# Patient Record
Sex: Male | Born: 1940
Health system: Southern US, Community
[De-identification: ages and names within clinical notes are randomized; demographics above are authoritative.]

## PROBLEM LIST (undated history)

## (undated) DIAGNOSIS — M109 Gout, unspecified: Secondary | ICD-10-CM

## (undated) DIAGNOSIS — Z0000 Encounter for general adult medical examination without abnormal findings: Secondary | ICD-10-CM

## (undated) DIAGNOSIS — I4891 Unspecified atrial fibrillation: Secondary | ICD-10-CM

## (undated) DIAGNOSIS — F489 Nonpsychotic mental disorder, unspecified: Secondary | ICD-10-CM

## (undated) DIAGNOSIS — M1990 Unspecified osteoarthritis, unspecified site: Secondary | ICD-10-CM

## (undated) DIAGNOSIS — K635 Polyp of colon: Secondary | ICD-10-CM

## (undated) DIAGNOSIS — J189 Pneumonia, unspecified organism: Secondary | ICD-10-CM

## (undated) DIAGNOSIS — F419 Anxiety disorder, unspecified: Secondary | ICD-10-CM

## (undated) DIAGNOSIS — E7800 Pure hypercholesterolemia, unspecified: Secondary | ICD-10-CM

## (undated) DIAGNOSIS — F329 Major depressive disorder, single episode, unspecified: Secondary | ICD-10-CM

## (undated) DIAGNOSIS — I10 Essential (primary) hypertension: Secondary | ICD-10-CM

## (undated) DIAGNOSIS — F32A Depression, unspecified: Secondary | ICD-10-CM

## (undated) DIAGNOSIS — G4730 Sleep apnea, unspecified: Secondary | ICD-10-CM

## (undated) DIAGNOSIS — K219 Gastro-esophageal reflux disease without esophagitis: Secondary | ICD-10-CM

## (undated) HISTORY — DX: Unspecified osteoarthritis, unspecified site: M19.90

## (undated) HISTORY — DX: Major depressive disorder, single episode, unspecified: F32.9

## (undated) HISTORY — DX: Pneumonia, unspecified organism: J18.9

## (undated) HISTORY — PX: OTHER SURGICAL HISTORY: SHX169

## (undated) HISTORY — DX: Polyp of colon: K63.5

## (undated) HISTORY — DX: Gout, unspecified: M10.9

## (undated) HISTORY — DX: Encounter for general adult medical examination without abnormal findings: Z00.00

## (undated) HISTORY — DX: Essential (primary) hypertension: I10

## (undated) HISTORY — DX: Gastro-esophageal reflux disease without esophagitis: K21.9

## (undated) HISTORY — DX: Depression, unspecified: F32.A

## (undated) HISTORY — DX: Pure hypercholesterolemia, unspecified: E78.00

## (undated) HISTORY — DX: Nonpsychotic mental disorder, unspecified: F48.9

## (undated) HISTORY — DX: Sleep apnea, unspecified: G47.30

## (undated) HISTORY — PX: WISDOM TOOTH EXTRACTION: SHX21

## (undated) HISTORY — DX: Anxiety disorder, unspecified: F41.9

## (undated) HISTORY — DX: Unspecified atrial fibrillation: I48.91

---

## 2000-05-11 ENCOUNTER — Emergency Department (HOSPITAL_COMMUNITY): Admission: EM | Admit: 2000-05-11 | Discharge: 2000-05-11 | Payer: Self-pay | Admitting: Emergency Medicine

## 2000-05-11 ENCOUNTER — Encounter: Payer: Self-pay | Admitting: Emergency Medicine

## 2006-06-30 ENCOUNTER — Ambulatory Visit: Payer: Self-pay | Admitting: Pulmonary Disease

## 2006-09-08 ENCOUNTER — Ambulatory Visit: Payer: Self-pay | Admitting: Pulmonary Disease

## 2006-09-20 ENCOUNTER — Ambulatory Visit: Payer: Self-pay | Admitting: Gastroenterology

## 2006-10-04 ENCOUNTER — Ambulatory Visit: Payer: Self-pay | Admitting: Gastroenterology

## 2006-10-04 HISTORY — PX: COLONOSCOPY: SHX174

## 2007-07-03 ENCOUNTER — Emergency Department (HOSPITAL_COMMUNITY): Admission: EM | Admit: 2007-07-03 | Discharge: 2007-07-03 | Payer: Self-pay | Admitting: Emergency Medicine

## 2007-07-17 ENCOUNTER — Ambulatory Visit: Payer: Self-pay | Admitting: Pulmonary Disease

## 2007-12-28 ENCOUNTER — Emergency Department (HOSPITAL_COMMUNITY): Admission: EM | Admit: 2007-12-28 | Discharge: 2007-12-28 | Payer: Self-pay | Admitting: Emergency Medicine

## 2008-05-01 DIAGNOSIS — E7800 Pure hypercholesterolemia, unspecified: Secondary | ICD-10-CM

## 2008-07-16 ENCOUNTER — Ambulatory Visit: Payer: Self-pay | Admitting: Pulmonary Disease

## 2008-07-16 DIAGNOSIS — F489 Nonpsychotic mental disorder, unspecified: Secondary | ICD-10-CM | POA: Insufficient documentation

## 2008-07-16 DIAGNOSIS — Z8701 Personal history of pneumonia (recurrent): Secondary | ICD-10-CM

## 2008-07-16 DIAGNOSIS — Z8679 Personal history of other diseases of the circulatory system: Secondary | ICD-10-CM | POA: Insufficient documentation

## 2008-08-26 ENCOUNTER — Encounter: Payer: Self-pay | Admitting: Pulmonary Disease

## 2008-10-15 ENCOUNTER — Ambulatory Visit: Payer: Self-pay | Admitting: Pulmonary Disease

## 2008-12-16 ENCOUNTER — Ambulatory Visit: Payer: Self-pay | Admitting: Pulmonary Disease

## 2009-11-20 ENCOUNTER — Ambulatory Visit: Payer: Self-pay | Admitting: Pulmonary Disease

## 2009-11-20 DIAGNOSIS — M1990 Unspecified osteoarthritis, unspecified site: Secondary | ICD-10-CM

## 2009-11-21 LAB — CONVERTED CEMR LAB
ALT: 24 units/L (ref 0–53)
AST: 28 units/L (ref 0–37)
Albumin: 4.3 g/dL (ref 3.5–5.2)
Alkaline Phosphatase: 53 units/L (ref 39–117)
BUN: 9 mg/dL (ref 6–23)
Basophils Absolute: 0 10*3/uL (ref 0.0–0.1)
Basophils Relative: 0.5 % (ref 0.0–3.0)
Bilirubin Urine: NEGATIVE
Bilirubin, Direct: 0.1 mg/dL (ref 0.0–0.3)
CO2: 30 meq/L (ref 19–32)
Calcium: 9.5 mg/dL (ref 8.4–10.5)
Chloride: 106 meq/L (ref 96–112)
Cholesterol: 152 mg/dL (ref 0–200)
Creatinine, Ser: 0.8 mg/dL (ref 0.4–1.5)
Eosinophils Absolute: 0.2 10*3/uL (ref 0.0–0.7)
Eosinophils Relative: 3.9 % (ref 0.0–5.0)
GFR calc non Af Amer: 102.71 mL/min (ref >60)
Glucose, Bld: 97 mg/dL (ref 70–99)
HCT: 42.7 % (ref 39.0–52.0)
HDL: 55 mg/dL (ref >39.00)
Hemoglobin: 14.5 g/dL (ref 13.0–17.0)
Ketones, ur: NEGATIVE mg/dL
LDL Cholesterol: 84 mg/dL (ref 0–99)
Leukocytes, UA: NEGATIVE
Lymphocytes Relative: 32.9 % (ref 12.0–46.0)
Lymphs Abs: 2.1 10*3/uL (ref 0.7–4.0)
MCHC: 34 g/dL (ref 30.0–36.0)
MCV: 93.2 fL (ref 78.0–100.0)
Monocytes Absolute: 0.5 10*3/uL (ref 0.1–1.0)
Monocytes Relative: 7.1 % (ref 3.0–12.0)
Neutro Abs: 3.6 10*3/uL (ref 1.4–7.7)
Neutrophils Relative %: 55.6 % (ref 43.0–77.0)
Nitrite: NEGATIVE
PSA: 0.54 ng/mL (ref 0.10–4.00)
Platelets: 215 10*3/uL (ref 150.0–400.0)
Potassium: 4.7 meq/L (ref 3.5–5.1)
RBC: 4.57 M/uL (ref 4.22–5.81)
RDW: 12.6 % (ref 11.5–14.6)
Sodium: 142 meq/L (ref 135–145)
Specific Gravity, Urine: >=1.03 (ref 1.000–1.030)
TSH: 1.47 microintl units/mL (ref 0.35–5.50)
Total Bilirubin: 1 mg/dL (ref 0.3–1.2)
Total CHOL/HDL Ratio: 3
Total Protein, Urine: NEGATIVE mg/dL
Total Protein: 7.4 g/dL (ref 6.0–8.3)
Triglycerides: 63 mg/dL (ref 0.0–149.0)
Uric Acid, Serum: 7.5 mg/dL (ref 4.0–7.8)
Urine Glucose: NEGATIVE mg/dL
Urobilinogen, UA: 0.2 (ref 0.0–1.0)
VLDL: 12.6 mg/dL (ref 0.0–40.0)
WBC: 6.4 10*3/uL (ref 4.5–10.5)
pH: 5 (ref 5.0–8.0)

## 2010-11-15 ENCOUNTER — Telehealth (INDEPENDENT_AMBULATORY_CARE_PROVIDER_SITE_OTHER): Payer: Self-pay | Admitting: *Deleted

## 2010-12-10 ENCOUNTER — Ambulatory Visit: Payer: Self-pay | Admitting: Pulmonary Disease

## 2011-01-19 ENCOUNTER — Ambulatory Visit: Admit: 2011-01-19 | Payer: Self-pay | Admitting: Pulmonary Disease

## 2011-01-23 LAB — CONVERTED CEMR LAB
ALT: 22 units/L (ref 0–53)
AST: 26 units/L (ref 0–37)
Albumin: 4.1 g/dL (ref 3.5–5.2)
Alkaline Phosphatase: 69 units/L (ref 39–117)
BUN: 14 mg/dL (ref 6–23)
Basophils Absolute: 0 10*3/uL (ref 0.0–0.1)
Basophils Relative: 0.5 % (ref 0.0–3.0)
Bilirubin Urine: NEGATIVE
Bilirubin, Direct: 0.1 mg/dL (ref 0.0–0.3)
CO2: 29 meq/L (ref 19–32)
Calcium: 9.6 mg/dL (ref 8.4–10.5)
Chloride: 105 meq/L (ref 96–112)
Cholesterol: 168 mg/dL (ref 0–200)
Creatinine, Ser: 0.8 mg/dL (ref 0.4–1.5)
Crystals: NEGATIVE
Eosinophils Absolute: 0.3 10*3/uL (ref 0.0–0.7)
Eosinophils Relative: 4.8 % (ref 0.0–5.0)
GFR calc Af Amer: 125 mL/min
GFR calc non Af Amer: 103 mL/min
Glucose, Bld: 99 mg/dL (ref 70–99)
HCT: 42.2 % (ref 39.0–52.0)
HDL: 47.5 mg/dL (ref >39.0)
Hemoglobin, Urine: NEGATIVE
Hemoglobin: 14.6 g/dL (ref 13.0–17.0)
Ketones, ur: NEGATIVE mg/dL
LDL Cholesterol: 105 mg/dL — ABNORMAL HIGH (ref 0–99)
Leukocytes, UA: NEGATIVE
Lymphocytes Relative: 33 % (ref 12.0–46.0)
MCHC: 34.7 g/dL (ref 30.0–36.0)
MCV: 91.2 fL (ref 78.0–100.0)
Monocytes Absolute: 0.6 10*3/uL (ref 0.1–1.0)
Monocytes Relative: 8.2 % (ref 3.0–12.0)
Neutro Abs: 3.9 10*3/uL (ref 1.4–7.7)
Neutrophils Relative %: 53.5 % (ref 43.0–77.0)
Nitrite: NEGATIVE
PSA: 0.52 ng/mL (ref 0.10–4.00)
Platelets: 242 10*3/uL (ref 150–400)
Potassium: 4.4 meq/L (ref 3.5–5.1)
RBC: 4.62 M/uL (ref 4.22–5.81)
RDW: 12.6 % (ref 11.5–14.6)
Sodium: 141 meq/L (ref 135–145)
Specific Gravity, Urine: >=1.03 (ref 1.000–1.03)
TSH: 1.64 microintl units/mL (ref 0.35–5.50)
Total Bilirubin: 1.1 mg/dL (ref 0.3–1.2)
Total CHOL/HDL Ratio: 3.5
Total Protein, Urine: NEGATIVE mg/dL
Total Protein: 7.3 g/dL (ref 6.0–8.3)
Triglycerides: 79 mg/dL (ref 0–149)
Urine Glucose: NEGATIVE mg/dL
Urobilinogen, UA: 0.2 (ref 0.0–1.0)
VLDL: 16 mg/dL (ref 0–40)
WBC: 7.2 10*3/uL (ref 4.5–10.5)
pH: 5.5 (ref 5.0–8.0)

## 2011-01-25 NOTE — Progress Notes (Signed)
Summary: rx request  Phone Note Call from Patient   Caller: Spouse-jane Zaldivar Call For: nadel Summary of Call: requests 1 month rx simvastatin- [facility] [address] (pt will get the remainder thru mailorder but needs this in the meantime). call jane Spagna at [phone] Initial call taken by: Tivis Ringer, CNA,  November 15, 2010 12:17 PM  Follow-up for Phone Call        Pt's spouse informed that [facility] [address]. Abigail Miyamoto RN  November 15, 2010 12:21 PM     Prescriptions: ZOCOR 40 MG  TABS (SIMVASTATIN) Take 1 tablet by mouth once a day  #30 x 0   Entered by:   Abigail Miyamoto RN   Authorized by:   Michele Mcalpine MD   Signed by:   Abigail Miyamoto RN on 11/15/2010   Method used:   [facility]        [phone] (retail[facility]       [address] [place], [place]  [address]       Ph: [phone] or [phone]       Fax: [phone]   RxID:   [id number]

## 2011-01-27 NOTE — Assessment & Plan Note (Signed)
Summary: flu shot/jd  Nurse Visit   Allergies: No Known Drug Allergies  Immunizations Administered:  Influenza Vaccine # 1:    Vaccine Type: Fluvax 3+    Site: left deltoid    Mfr: GlaxoSmithKline    Dose: 0.5 ml    Route: IM    Given by: Clarise Cruz    Exp. Date: 06/25/2011    Lot #: [id number]    VIS given: 07/20/10 version given December 15, 2010.  Flu Vaccine Consent Questions:    Do you have a history of severe allergic reactions to this vaccine? no    Any prior history of allergic reactions to egg and/or gelatin? no    Do you have a sensitivity to the preservative Thimersol? no    Do you have a past history of Guillan-Barre Syndrome? no    Do you currently have an acute febrile illness? no    Have you ever had a severe reaction to latex? no    Vaccine information given and explained to patient? yes  Orders Added: 1)  Flu Vaccine 51yrs + [90658] 2)  Admin 1st Vaccine [14782]

## 2011-03-02 ENCOUNTER — Telehealth (INDEPENDENT_AMBULATORY_CARE_PROVIDER_SITE_OTHER): Payer: Self-pay | Admitting: *Deleted

## 2011-03-03 ENCOUNTER — Encounter: Payer: Self-pay | Admitting: Pulmonary Disease

## 2011-03-03 ENCOUNTER — Other Ambulatory Visit: Payer: Self-pay

## 2011-03-03 ENCOUNTER — Other Ambulatory Visit: Payer: Self-pay | Admitting: Pulmonary Disease

## 2011-03-03 ENCOUNTER — Ambulatory Visit (INDEPENDENT_AMBULATORY_CARE_PROVIDER_SITE_OTHER): Payer: 59 | Admitting: Pulmonary Disease

## 2011-03-03 ENCOUNTER — Ambulatory Visit (INDEPENDENT_AMBULATORY_CARE_PROVIDER_SITE_OTHER)
Admission: RE | Admit: 2011-03-03 | Discharge: 2011-03-03 | Disposition: A | Discharge status: Home / Self Care | Payer: 59 | Source: Ambulatory Visit | Attending: Pulmonary Disease | Admitting: Pulmonary Disease

## 2011-03-03 DIAGNOSIS — Z1389 Encounter for screening for other disorder: Secondary | ICD-10-CM

## 2011-03-03 DIAGNOSIS — M1990 Unspecified osteoarthritis, unspecified site: Secondary | ICD-10-CM

## 2011-03-03 DIAGNOSIS — M109 Gout, unspecified: Secondary | ICD-10-CM

## 2011-03-03 DIAGNOSIS — I4891 Unspecified atrial fibrillation: Secondary | ICD-10-CM

## 2011-03-03 DIAGNOSIS — Z0000 Encounter for general adult medical examination without abnormal findings: Secondary | ICD-10-CM

## 2011-03-03 DIAGNOSIS — J189 Pneumonia, unspecified organism: Secondary | ICD-10-CM

## 2011-03-03 DIAGNOSIS — E7800 Pure hypercholesterolemia, unspecified: Secondary | ICD-10-CM

## 2011-03-03 DIAGNOSIS — Z8701 Personal history of pneumonia (recurrent): Secondary | ICD-10-CM

## 2011-03-03 DIAGNOSIS — F489 Nonpsychotic mental disorder, unspecified: Secondary | ICD-10-CM

## 2011-03-03 DIAGNOSIS — Z8679 Personal history of other diseases of the circulatory system: Secondary | ICD-10-CM

## 2011-03-03 NOTE — Assessment & Plan Note (Signed)
He has mild DJD, ?hx gout (prev Uric=7.5), and uses OTC meds Prn... Actually he notes that he really hasn't had much trouble since he stopped eating tuna fish!

## 2011-03-03 NOTE — Patient Instructions (Signed)
We updated your med list today & wrote new prescriptions for 2012... Today we did your follow up CXR & EKG... Please return to our lab in the AM for your FASTING blood work... Then please call the "[facility]" in a few days for your results... If there are any changes necessary in your treatment regimen we will contact you. Work on [employer] reduction & your exercise program... Call for any questions... Let's plan a follow up visit in one yr, sooner if needed.Marland KitchenMarland Kitchen

## 2011-03-03 NOTE — Assessment & Plan Note (Addendum)
FLP 7/07 on diet alone showed TChol 254, TG 70, HDL 48, LDL 209> started on Simvastatin40. Excellent response & last FLP 11/10 showed TChol 152, TG 63, HDL 55, LDL 84 on Simva40. He cut dose to 20mg /d on his own in 2011 due to stuff he heart on the TV... Follow up FLP 3/12 on 20mg /d dose ==> pending.Marland KitchenMarland Kitchen

## 2011-03-03 NOTE — Assessment & Plan Note (Signed)
Stable on Effexor XR 75mg /d per [name] in [place]  Continue same.

## 2011-03-03 NOTE — Progress Notes (Unsigned)
  Subjective:    Patient ID: Jason [name], male    DOB: 07-16-1941, 70 y.o.   MRN: [id number]  HPI    Review of Systems     Objective:   Physical Exam        Assessment & Plan:

## 2011-03-03 NOTE — Progress Notes (Signed)
Subjective:    Patient ID: Jason [name], male    DOB: 20-Oct-1941, 70 y.o.   MRN: [id number]  HPI   15 month ROV & review of mult medical problems including remote hx PAF; Hyperchol; Overweight; DJD & ?hx of gout; hx of Psyche disorder... ?Hx of PAF:  Remote hx PAF (details unknown), holding NSR w/o ectopy since we have known him starting in 2007... States he has occas palpit, skips, nothing sustained, & noted esp when stressed, denies symptoms w/ exercise, no CP/ SOB/ edema/ etc... Not currently on meds and we discussed avoiding caffeine, pseudophed, etc> further eval if symptoms worsen. Hyperchol:  Prev labs looked satis on Simva40 but he cut back on his own to 20mg /d "due to everything that I heard on TV"... FLP pending on the 20mg /d dose ==> he will ret in AM for Fasting labs. DJD:  He uses OTC Ibuprofen Prn; no specific arthritis complaints at this time. Psyche:  Hx "physical breakdown" age 77, followed by [place] in [place] on [facility] 75mg /d, stable & doing well by his hx. Health Maintenance:   ~  GI:  Followed by DrJacobs, neg colon 2007- no divertics, no polyps, f/u due 2017. ~  GU:  Asymptomatic, norm DRE, norm PSAs, atrophic testes on exam- feels well, good energy, etc. ~  Immunizations:  He gets the yearly flu vaccine;  Had PNEUMOVAX & TETANUS in 2007 (age 58).    Review of Systems  Constitutional: Negative for fever, chills, diaphoresis and unexpected weight change.  HENT: Negative for hearing loss, ear pain, sore throat, sneezing, neck pain, neck stiffness, voice change, postnasal drip, sinus pressure and ear discharge.   Eyes: Negative for pain, discharge, itching and visual disturbance.  Respiratory: Negative for cough, chest tightness, shortness of breath and wheezing.   Cardiovascular: Negative for chest pain, palpitations and leg swelling.  Gastrointestinal: Negative for nausea, vomiting, abdominal pain, diarrhea, constipation, blood in stool and abdominal distention.    Genitourinary: Negative for dysuria, urgency, frequency, hematuria and flank pain.  Musculoskeletal: Negative for back pain, joint swelling and arthralgias.  Skin: Negative for pallor and rash.  Neurological: Negative for dizziness, tremors, seizures, syncope, speech difficulty, weakness, light-headedness, numbness and headaches.  Hematological: Negative for adenopathy. Does not bruise/bleed easily.  Psychiatric/Behavioral: Negative for hallucinations, confusion, sleep disturbance, dysphoric mood and agitation. The patient is not nervous/anxious.       Objective:   Physical Exam  Nursing note and vitals reviewed. Constitutional: He is oriented to person, place, and time. He appears well-developed and well-nourished.  HENT:  Head: Normocephalic and atraumatic.  Right Ear: Tympanic membrane, external ear and ear canal normal.  Left Ear: Tympanic membrane, external ear and ear canal normal.  Nose: Nose normal.  Mouth/Throat: Uvula is midline, oropharynx is clear and moist and mucous membranes are normal. No oral lesions.  Eyes: Conjunctivae, EOM and lids are normal. Pupils are equal, round, and reactive to light. Right eye exhibits no discharge. Left eye exhibits no discharge. No scleral icterus.  Neck: Normal range of motion. Neck supple. No JVD present. Carotid bruit is not present. No mass and no thyromegaly present.  Cardiovascular: Normal rate, regular rhythm, S1 normal, S2 normal, normal heart sounds and normal pulses.  Exam reveals no gallop and no friction rub.   No murmur heard. Pulmonary/Chest: Effort normal and breath sounds normal. No respiratory distress. He has no wheezes. He has no rhonchi. He has no rales. He exhibits no tenderness.  Abdominal: Soft. Bowel sounds are normal.  He exhibits no distension, no abdominal bruit and no mass. There is no hepatosplenomegaly. There is no tenderness. There is no rebound and no guarding. No hernia.       Stool heme neg.  Genitourinary:  Rectum normal and prostate normal.       Testes are somewhat atrophic.  Musculoskeletal: Normal range of motion. He exhibits no edema and no tenderness.  Lymphadenopathy:    He has no cervical adenopathy.    He has no axillary adenopathy.       Right: No inguinal adenopathy present.       Left: No inguinal adenopathy present.  Neurological: He is alert and oriented to person, place, and time. He has normal strength and normal reflexes. No cranial nerve deficit or sensory deficit.  Skin: Skin is warm, dry and intact. No rash noted. No cyanosis. Nails show no clubbing.  Psychiatric: He has a normal mood and affect. His behavior is normal. Judgment normal. Cognition and memory are normal.      Assessment & Plan:

## 2011-03-03 NOTE — Assessment & Plan Note (Signed)
Remote hx pneumonia w/ full recovery... F/u CXR today ==> clear & WNL.

## 2011-03-03 NOTE — Assessment & Plan Note (Signed)
He is up to date on all needed screening procedures> f/u colon due 2017;  DRE is neg & f/u PSA ==> pending labs today;  He is up to date on his vaccinations & will need repeat Pneumovax next year;  We discussed Shingles vaccine & he will think about it.

## 2011-03-03 NOTE — Assessment & Plan Note (Signed)
EKG shows NSR rate 90/min w/ occas PAC... He notes occas palpit w/ stress/ anxiety, but not w/ activity/ exercise/ etc... We discussed no caffeine, etc;  And offered low dose BBlocker Rx vs further cardiac eval & referral but he prefers to wait, noting that the symptoms are mild & not assoc w/ dizzy/ lightheaded/ syncope/ etc... We will call for worsening problems.

## 2011-03-04 ENCOUNTER — Ambulatory Visit: Payer: Self-pay | Admitting: Pulmonary Disease

## 2011-03-04 ENCOUNTER — Other Ambulatory Visit: Payer: Self-pay | Admitting: Pulmonary Disease

## 2011-03-04 ENCOUNTER — Encounter (INDEPENDENT_AMBULATORY_CARE_PROVIDER_SITE_OTHER): Payer: Self-pay | Admitting: *Deleted

## 2011-03-04 ENCOUNTER — Other Ambulatory Visit: Payer: 59

## 2011-03-04 DIAGNOSIS — Z1212 Encounter for screening for malignant neoplasm of rectum: Secondary | ICD-10-CM

## 2011-03-04 DIAGNOSIS — E039 Hypothyroidism, unspecified: Secondary | ICD-10-CM

## 2011-03-04 DIAGNOSIS — D649 Anemia, unspecified: Secondary | ICD-10-CM

## 2011-03-04 DIAGNOSIS — E7800 Pure hypercholesterolemia, unspecified: Secondary | ICD-10-CM

## 2011-03-04 DIAGNOSIS — R748 Abnormal levels of other serum enzymes: Secondary | ICD-10-CM

## 2011-03-04 DIAGNOSIS — I10 Essential (primary) hypertension: Secondary | ICD-10-CM

## 2011-03-04 DIAGNOSIS — N3000 Acute cystitis without hematuria: Secondary | ICD-10-CM

## 2011-03-04 LAB — URINALYSIS
Bilirubin Urine: NEGATIVE
Ketones, ur: NEGATIVE
Specific Gravity, Urine: >=1.03 (ref 1.000–1.030)
Total Protein, Urine: NEGATIVE
pH: 5.5 (ref 5.0–8.0)

## 2011-03-04 LAB — CBC WITH DIFFERENTIAL/PLATELET
Basophils Absolute: 0 10*3/uL (ref 0.0–0.1)
Eosinophils Relative: 4 % (ref 0.0–5.0)
HCT: 41.6 % (ref 39.0–52.0)
Lymphocytes Relative: 32.7 % (ref 12.0–46.0)
Monocytes Relative: 8 % (ref 3.0–12.0)
Neutrophils Relative %: 54.8 % (ref 43.0–77.0)
Platelets: 220 10*3/uL (ref 150.0–400.0)
WBC: 6.4 10*3/uL (ref 4.5–10.5)

## 2011-03-04 LAB — LIPID PANEL
Cholesterol: 165 mg/dL (ref 0–200)
HDL: 50.4 mg/dL (ref >39.00)
LDL Cholesterol: 103 mg/dL — ABNORMAL HIGH (ref 0–99)
VLDL: 11.8 mg/dL (ref 0.0–40.0)

## 2011-03-04 LAB — BASIC METABOLIC PANEL
BUN: 13 mg/dL (ref 6–23)
Calcium: 9.3 mg/dL (ref 8.4–10.5)
GFR: 83.9 mL/min (ref >60.00)
Potassium: 4.4 mEq/L (ref 3.5–5.1)

## 2011-03-04 LAB — HEPATIC FUNCTION PANEL
AST: 26 U/L (ref 0–37)
Alkaline Phosphatase: 61 U/L (ref 39–117)
Bilirubin, Direct: 0.2 mg/dL (ref 0.0–0.3)
Total Bilirubin: 0.7 mg/dL (ref 0.3–1.2)

## 2011-03-04 LAB — TSH: TSH: 1.51 u[IU]/mL (ref 0.35–5.50)

## 2011-03-08 NOTE — Assessment & Plan Note (Signed)
Summary: appt / cj   CC:  15 month ROV & review of mult medical problems....  History of Present Illness: NOTE FOR THIS VISIT WAS DONE IN EPIC EMR...  Preventive Screening-Counseling & Management  Alcohol-Tobacco     Smoking Status: never  Allergies (verified): No Known Drug Allergies  Comments:  Nurse/Medical Assistant: The patient's medications and allergies were reviewed with the patient and were updated in the Medication and Allergy Lists.  Past History:  Past Medical History: Hx of PNEUMONIA (ICD-486) Hx of PAROXYSMAL ATRIAL FIBRILLATION (ICD-427.31) HYPERCHOLESTEROLEMIA (ICD-272.0) DEGENERATIVE JOINT DISEASE, MILD (ICD-715.90) GOUT (ICD-274.9) Hx of PSYCHIATRIC DISORDER (ICD-300.9)  Family History: Reviewed history from 11/20/2009 and no changes required. Father died age 20 from pneumonia & heart failure Mother died age 48 with dementia 2 Siblings- both w/ arthritis  Social History: Reviewed history from 11/20/2009 and no changes required. non smoker no etoh married no children  Vital Signs:  Patient profile:   70 year old male Height:      75 inches Weight:      233.25 pounds BMI:     29.26 O2 Sat:      98 % on Room air Temp:     98.9 degrees F oral Pulse rate:   89 / minute BP sitting:   130 / 90  (right arm) Cuff size:   regular  Vitals Entered By: Randell Loop CMA (March 03, 2011 4:22 PM)  O2 Sat at Rest %:  98 O2 Flow:  Room air CC: 15 month ROV & review of mult medical problems... Is Patient Diabetic? No Pain Assessment Patient in pain? no      Comments meds updated today with pt    Impression & Recommendations:  Problem # 1:  ASSESSMENT & PLANS FOR THIS VISIT WERE DONE IN EPIC EMR>>>  Complete Medication List: 1)  Adult Aspirin Low Strength 81 Mg Tbdp (Aspirin) .... Take 1 tablet by mouth once a day 2)  Zocor 40 Mg Tabs (Simvastatin) .... Take as directed... 3)  Multivitamins Tabs (Multiple vitamin) .... Take 1 tablet by mouth  once a day 4)  Effexor Xr 75 Mg Cp24 (Venlafaxine hcl) .... Take 1 tablet by mouth once a day as directed by drashby  Other Orders: 12 Lead EKG (12 Lead EKG) T-2 View CXR (71020TC)  Patient Instructions: 1)  Today we updated your med list- see below.... 2)  We refilled your Simvastatin as discussed... 3)  Today we did your follow up CXR & EKG... 4)  Please return to our office in the AM for your FASTING blood work...  then please call the "phone tree" in a few days for your lab results.Marland KitchenMarland Kitchen  5)  Work on losing about 10-15 lbs... 6)  Call for any problems.Marland KitchenMarland Kitchen 7)  Please schedule a follow-up appointment in 1 year, sooner as needed. Prescriptions: ZOCOR 40 MG  TABS (SIMVASTATIN) take as directed...  #90 x 4   Entered and Authorized by:   Michele Mcalpine MD   Signed by:   Michele Mcalpine MD on 03/03/2011   Method used:   Print then Give to Patient   RxID:   [id number]

## 2011-03-08 NOTE — Progress Notes (Signed)
Summary: appt PUT LABS IN TODAY > done  Phone Note Call from Patient Call back at [phone]   Caller: Spouse//jane Call For: nadel Summary of Call: Pt stated someone called pt today about rescheduling his Friday appt to Thursday at 4p pls advise. Initial call taken by: Darletta Moll,  March 02, 2011 3:44 PM  Follow-up for Phone Call        Marliss Czar, do you know anything about this?   Gweneth Dimitri RN  March 02, 2011 3:57 PM  Per Babette Relic D, they were wanting pt to move to Thurs at 4pm.  [facility] to move this appt if still ok with pt.  If he wants, he can come in for labs in am -- we will just need to know so we can have these in the computer. Follow-up by: Gweneth Dimitri RN,  March 02, 2011 4:28 PM  Additional Follow-up for Phone Call Additional follow up Details #1::        Pt wife returned call.  She is ok with pt being seen tomorrow at 4pm. Advised appt has been moved and old appt on Friday will be cancelled.  She states pt will come in the morning for lab work but would like this put in today before we leave as he will probably be here tomorrow am before we get here.  Advised this will be done.  Dr. Kriste Basque, pls advise what labs pt will need.  Thanks! Additional Follow-up by: Gweneth Dimitri RN,  March 02, 2011 4:52 PM    Additional Follow-up for Phone Call Additional follow up Details #2::    per SN: okay for flp 272.0, liver 798.5, cbcd 285.9, tsh 244.9, psa v76.44.  called spoke with pt's wife.  she is aware fasting labs are in epic for 3.8.12. Boone Master CNA/MA  March 02, 2011 5:09 PM

## 2011-11-14 ENCOUNTER — Encounter: Payer: Self-pay | Admitting: Adult Health

## 2011-11-14 ENCOUNTER — Ambulatory Visit (INDEPENDENT_AMBULATORY_CARE_PROVIDER_SITE_OTHER): Payer: 59 | Admitting: Adult Health

## 2011-11-14 VITALS — BP 148/94 | HR 105 | Temp 98.4°F | Ht 74.5 in | Wt 237.8 lb

## 2011-11-14 DIAGNOSIS — Z23 Encounter for immunization: Secondary | ICD-10-CM

## 2011-11-14 DIAGNOSIS — I10 Essential (primary) hypertension: Secondary | ICD-10-CM

## 2011-11-14 DIAGNOSIS — F489 Nonpsychotic mental disorder, unspecified: Secondary | ICD-10-CM

## 2011-11-14 MED ORDER — ALPRAZOLAM 0.25 MG PO TABS: 0.2500 mg | ORAL_TABLET | Freq: Every evening | ORAL | Status: DC | PRN

## 2011-11-14 NOTE — Assessment & Plan Note (Signed)
tress reducers with walking, exercise as tolerated.    Make appointment with Psychiatrist to discuss anxiety May use Xanax 0.25mg  daily As needed  Anxiety  Stop Aleve. Avoid NSAIDS (ibuprofen, motrin, etc) and decongestants- sudafed.  May use tylenol As needed   Please contact office for sooner follow up if symptoms do not improve or worsen or seek emergency care  follow up Dr. Kriste Basque  In 4-6 weeks and As needed   Please contact office for sooner follow up if symptoms do not improve or worsen or seek emergency care

## 2011-11-14 NOTE — Progress Notes (Signed)
Subjective:    Patient ID: Jason [name], male    DOB: 1941/01/18, 70 y.o.   MRN: [id number]  HPI 70 yo WM with known hx of  mult medical problems including remote hx PAF; Hyperchol; Overweight; DJD & ?hx of gout; hx of Psyche disorder...   11/14/2011 Acute OV  Complains of 1 week ago woke up and felt very jittery, uptight, anxious , frightened. NO chest pain, dyspnea. Felt like he was going to hyperventilate, knots in his stomach. Felt panic. [facility] , b/p was elevated on several readings. B/P 200. He went to ER in [place].  Seen in Er on 11/07/11 , started on Norvasc. B/p have been coming down w/ avg ~130-140  He does not exercise and not on diet.  Is taking aleve on regular basis.  Denies chest pain or dyspnea no visual/speech changes Records from [facility] ER not available at today;s visit. Says he had labs and EKG that were reported normal to him.  He has increased Effexor to 150mg  daily few days ago. Followed by Pyschiatry in [place].    PMH :  ?Hx of PAF: Remote hx PAF (details unknown), holding NSR w/o ectopy since we have known him starting in 2007... States he has occas palpit, skips, nothing sustained, & noted esp when stressed, denies symptoms w/ exercise, no CP/ SOB/ edema/ etc... Not currently on meds and we discussed avoiding caffeine, pseudophed, etc> further eval if symptoms worsen.   Hyperchol: Prev labs looked satis on Simva40 but he cut back on his own to 20mg /d "due to everything that I heard on TV"... FLP pending on the 20mg /d dose ==> he will ret in AM for Fasting labs.   DJD: He uses OTC Ibuprofen Prn; no specific arthritis complaints at this time.   Psyche: Hx "physical breakdown" age 31, followed by [place] in [place] on EffexorXR 75mg /d, stable & doing well by his hx.   Health Maintenance:  ~ GI: Followed by DrJacobs, neg colon 2007- no divertics, no polyps, f/u due 2017.  ~ GU: Asymptomatic, norm DRE, norm PSAs, atrophic testes on exam-  feels well, good energy, etc.  ~ Immunizations: He gets the yearly flu vaccine; Had PNEUMOVAX & TETANUS in 2007 (age 45).       Review of Systems Constitutional:   No  weight loss, night sweats,  Fevers, chills, fatigue, or  lassitude.  HEENT:   No headaches,  Difficulty swallowing,  Tooth/dental problems, or  Sore throat,                No sneezing, itching, ear ache, nasal congestion, post nasal drip,   CV:  No chest pain,  Orthopnea, PND, swelling in lower extremities, anasarca, dizziness, palpitations, syncope.   GI  No heartburn, indigestion, abdominal pain, nausea, vomiting, diarrhea, change in bowel habits, loss of appetite, bloody stools.   Resp: No shortness of breath with exertion or at rest.  No excess mucus, no productive cough,  No non-productive cough,  No coughing up of blood.  No change in color of mucus.  No wheezing.  No chest wall deformity  Skin: no rash or lesions.  GU: no dysuria, change in color of urine, no urgency or frequency.  No flank pain, no hematuria   MS:  No joint pain or swelling.  No decreased range of motion.  No back pain.  Psych:  No change in mood or affect. ++ anxiety.  No memory loss.  Objective:   Physical Exam        Assessment & Plan:

## 2011-11-14 NOTE — Assessment & Plan Note (Signed)
Improved control on rx   Plan:  tress reducers with walking, exercise as tolerated.  Continue on Amlodipine 5mg  daily  Low salt diet. Weight loss.  Make appointment with Psychiatrist to discuss anxiety May use Xanax 0.25mg  daily As needed  Anxiety  Stop Aleve. Avoid NSAIDS (ibuprofen, motrin, etc) and decongestants- sudafed.  May use tylenol As needed   Please contact office for sooner follow up if symptoms do not improve or worsen or seek emergency care  follow up Dr. Kriste Basque  In 4-6 weeks and As needed   Please contact office for sooner follow up if symptoms do not improve or worsen or seek emergency care

## 2011-11-14 NOTE — Patient Instructions (Signed)
Stress reducers with walking, exercise as tolerated.  Continue on Amlodipine 5mg  daily  Low salt diet. Weight loss.  Make appointment with Psychiatrist to discuss anxiety May use Xanax 0.25mg  daily As needed  Anxiety  Stop Aleve. Avoid NSAIDS (ibuprofen, motrin, etc) and decongestants- sudafed.  May use tylenol As needed   Please contact office for sooner follow up if symptoms do not improve or worsen or seek emergency care  follow up Dr. Kriste Basque  In 4-6 weeks and As needed   Please contact office for sooner follow up if symptoms do not improve or worsen or seek emergency care

## 2011-12-05 ENCOUNTER — Telehealth: Payer: Self-pay | Admitting: Pulmonary Disease

## 2011-12-05 NOTE — Telephone Encounter (Signed)
lmomtcb x1 

## 2011-12-06 MED ORDER — AMLODIPINE BESYLATE 5 MG PO TABS: 5.0000 mg | ORAL_TABLET | Freq: Every day | ORAL | Status: DC

## 2011-12-06 NOTE — Telephone Encounter (Signed)
Spoke with pts wife and she is aware of refills sent to the pharmacy

## 2012-01-03 ENCOUNTER — Ambulatory Visit (INDEPENDENT_AMBULATORY_CARE_PROVIDER_SITE_OTHER): Payer: 59 | Admitting: Pulmonary Disease

## 2012-01-03 DIAGNOSIS — M1990 Unspecified osteoarthritis, unspecified site: Secondary | ICD-10-CM

## 2012-01-03 DIAGNOSIS — E559 Vitamin D deficiency, unspecified: Secondary | ICD-10-CM

## 2012-01-03 DIAGNOSIS — N139 Obstructive and reflux uropathy, unspecified: Secondary | ICD-10-CM

## 2012-01-03 DIAGNOSIS — E7800 Pure hypercholesterolemia, unspecified: Secondary | ICD-10-CM

## 2012-01-03 DIAGNOSIS — F489 Nonpsychotic mental disorder, unspecified: Secondary | ICD-10-CM

## 2012-01-03 DIAGNOSIS — I10 Essential (primary) hypertension: Secondary | ICD-10-CM

## 2012-01-03 DIAGNOSIS — F411 Generalized anxiety disorder: Secondary | ICD-10-CM

## 2012-01-03 DIAGNOSIS — Z8679 Personal history of other diseases of the circulatory system: Secondary | ICD-10-CM

## 2012-01-03 DIAGNOSIS — F419 Anxiety disorder, unspecified: Secondary | ICD-10-CM

## 2012-01-03 MED ORDER — AMLODIPINE BESYLATE 5 MG PO TABS: 5.0000 mg | ORAL_TABLET | Freq: Every day | ORAL | Status: DC

## 2012-01-03 NOTE — Patient Instructions (Signed)
Today we updated your med list in our EPIC system...    Continue your current medications the same...  Please return to our lab for your follow up fasting blood work...    Please call the PHONE T[facility] in a few days for your results...    Dial [id number] & when prompted enter your patient number followed by the # symbol...    Your patient number is:  [phone]  Call for any questions...  Let's plan a follow up visit in 6 months, sooner if needed for problems.Marland KitchenMarland Kitchen

## 2012-01-07 ENCOUNTER — Encounter: Payer: Self-pay | Admitting: Pulmonary Disease

## 2012-01-07 NOTE — Progress Notes (Signed)
Subjective:     Patient ID: Jason [name], male   DOB: 09-19-1941, 71 y.o.   MRN: [id number]  HPI 71 y/o WM here for a follow up appt and CPX... he is the son-in-law of [name]...   ~  November 20, 2009:  here for CPX- feeling well without new complaints or concerns... he's lost 15# on diet + exercise- walks, plays golf... denies CP, palpit, etc...  ~  March 03, 2011:  74mo ROV & review of medical problems including remote hx PAF; Hyperchol; Overweight; DJD & ?hx of gout; hx of Psyche disorder...     ?Hx of PAF: Remote hx PAF (details unknown), holding NSR w/o ectopy since we have known him starting in 2007... States he has occas palpit, skips, nothing sustained, & noted esp when stressed, denies symptoms w/ exercise, no CP/ SOB/ edema/ etc... Not currently on meds and we discussed avoiding caffeine, pseudophed, etc> further eval if symptoms worsen.     Hyperchol: Prev labs looked satis on Simva40 but he cut back on his own to 20mg /d "due to everything that I heard on TV"; FLP on the 20mg /d dose looks ok!    DJD: He uses OTC Ibuprofen Prn; no specific arthritis complaints at this time.     Psyche: Hx "physical breakdown" age 61, followed by [place] in [place] on [facility] 75mg /d, stable & doing well by his hx.   ~  January 03, 2012:  62mo ROV & he notes under incr stress due to death of 2 friends this past yr, sister dx w/ alz, & nephew's child has autism; he is torn betw retiring or not;  Due to all this his BP has been elevated> seen in ER & started on Norvasc5mg ;  He saw TP & Norvasc helping, she rec f/u w/ Psychiatrist in [place] & DrAshby increased his EffexorXR75 to 2/d & added Klonopin 0.5mg  Bid==> improved;  [name] requested that we check his VitD level & he will return for Fasting blood work at his convenience...          Problem List:  Hx of PNEUMONIA (ICD-486) - he had "walking pneumonia" in 2006 treated by [facility];  He is a never smoker...  HYPERTENSION >> mild HBP assoc w/ stress  & went to ER in 2012 w/ NORVASC 5mg /d started; f/u BP checks here ok on the Norvasc- continue same. ~  1/13:  BP= 110/88 & feeling better> denies CP, palpit, dizzy, SOB, edema, or cerebral ischemic symptoms...  Hx of PAROXYSMAL ATRIAL FIBRILLATION (ICD-427.31) - remote hx of PAF- details unknown... he has been in NSR without ectopy since we have known him starting in Jul07... denies CP, palit, dizzy, syncope, SOB, edema, etc...  He knows to avoid caffeine, pseudoephedrine etc...  HYPERCHOLESTEROLEMIA (ICD-272.0) - on SIMVASTATIN 40mg - taking 1/2 tab daily + FishOil etc... ~  FLP 7/07 on diet alone showed TChol 254, TG 70, HDL 48, LDL 209... needs Rx- start Simvastatin40. ~  FLP 9/07 on Zocor40 showed TChol 150, TG 46, HDL 46, LDL 94... great! continue same... ~  FLP 7/09 on Simva40 showed TChol 168, TG 79, HDL 48, LDL 105 ~  FLP 11/10 on Simva40 showed TChol 152, TG 63, HDL 55, LDL 84... He decr Simva to 20mg /d on his own. ~  FLP 3/12 on Simva20 showed TChol 165, TG 59, HDL 50, LDL 103  DEGENERATIVE JOINT DISEASE, MILD (ICD-715.90) GOUT (ICD-274.9) - ? hx of gout in the past... treated at [facility] w/ "antibiotic" per pt... he uses  OTC Rx Prn (Ibuprofen, cherry extract)... ~  labs 11/10 showed Uric= 7.5 (4-7.8)...  Hx of PSYCHIATRIC DISORDER (ICD-300.9) - hx of "physical breakdown" age 63... followed by [name], Psychiatrist in [place] on [facility] XR 75mg /d... ~  2012:  Pt noted increased stress & DrAshby icreased his EFFEXOR XR to 150mg /d & added KLONOPIN 0.5mg  Bid...  Health Maintenance:  ~ GI: Followed by DrJacobs, neg colon 2007- no divertics, no polyps, f/u due 2017.  ~ GU: Asymptomatic, norm DRE, norm PSAs, atrophic testes on exam- feels well, good energy, etc.  ~ Immunizations: He gets the yearly flu vaccine; Had PNEUMOVAX & TETANUS in 2007 (age 56).   No past surgical history on file.   Outpatient Encounter Prescriptions as of 01/03/2012  Medication Sig Dispense Refill  . amLODipine  (NORVASC) 5 MG tablet Take 1 tablet (5 mg total) by mouth daily.  30 tablet  11  . aspirin 81 MG tablet Take 81 mg by mouth daily.        . clonazePAM (KLONOPIN) 0.5 MG tablet Take 0.5 mg by mouth 2 (two) times daily as needed.        . Coenzyme Q10 (COQ10) 100 MG CAPS Take 1 capsule by mouth daily.        . Flaxseed, Linseed, (FLAXSEED OIL) 1200 MG CAPS Take 1 capsule by mouth daily.        . Glucosamine-Chondroit-Vit C-Mn (GLUCOSAMINE 1500 COMPLEX PO) Take 1 capsule by mouth daily.        . Multiple Vitamins-Minerals (MULTIVITAMIN PO) Take 1 tablet by mouth daily.        . multivitamin (THERAGRAN) per tablet Take 1 tablet by mouth daily.        . Omega-3 Fatty Acids (FISH OIL) 1200 MG CAPS Take 1 capsule by mouth daily.        . simvastatin (ZOCOR) 40 MG tablet Take 1/2 tablet by mouth at bedtime      . venlafaxine (EFFEXOR) 75 MG tablet Take one tablet by mouth two times daily      . DISCONTD: amLODipine (NORVASC) 5 MG tablet Take 1 tablet (5 mg total) by mouth daily.  30 tablet  1  . DISCONTD: ALPRAZolam (XANAX) 0.25 MG tablet Take 1 tablet (0.25 mg total) by mouth at bedtime as needed for anxiety.  30 tablet  0    No Known Allergies   Current Medications, Allergies, Past Medical History, Past Surgical History, Family History, and Social History were reviewed in [employer] record.    Review of Systems         The patient complains of joint pain and arthritis.  The patient denies fever, chills, sweats, anorexia, fatigue, weakness, malaise, weight loss, sleep disorder, blurring, diplopia, eye irritation, eye discharge, vision loss, eye pain, photophobia, earache, ear discharge, tinnitus, decreased hearing, nasal congestion, nosebleeds, sore throat, hoarseness, chest pain, palpitations, syncope, dyspnea on exertion, orthopnea, PND, peripheral edema, cough, dyspnea at rest, excessive sputum, hemoptysis, wheezing, pleurisy, nausea, vomiting, diarrhea, constipation,  change in bowel habits, abdominal pain, melena, hematochezia, jaundice, gas/bloating, indigestion/heartburn, dysphagia, odynophagia, dysuria, hematuria, urinary frequency, urinary hesitancy, nocturia, incontinence, back pain, joint swelling, muscle cramps, muscle weakness, stiffness, sciatica, restless legs, leg pain at night, leg pain with exertion, rash, itching, dryness, suspicious lesions, paralysis, paresthesias, seizures, tremors, vertigo, transient blindness, frequent falls, frequent headaches, difficulty walking, depression, anxiety, memory loss, confusion, cold intolerance, heat intolerance, polydipsia, polyphagia, polyuria, unusual weight change, abnormal bruising, bleeding, enlarged lymph nodes, urticaria, allergic rash, hay  fever, and recurrent infections.     Objective:   Physical Exam     WD, WN, 71 y/o WM in NAD... GENERAL:  Alert & oriented; pleasant & cooperative... HEENT:  [place]/AT, EOM-wnl, PERRLA, Fundi-benign, EACs-clear, TMs-wnl, NOSE-clear, THROAT-clear & wnl. NECK:  Supple w/ fairROM; no JVD; normal carotid impulses w/o bruits; no thyromegaly or nodules palpated; no lymphadenopathy. CHEST:  Clear to P & A; without wheezes/ rales/ or rhonchi. HEART:  Regular Rhythm; without murmurs/ rubs/ or gallops. ABDOMEN:  Soft & nontender; normal bowel sounds; no organomegaly or masses detected. RECTAL:  Neg - prostate 3+ & nontender w/o nodules; stool hematest neg. EXT: without deformities, mild arthritic changes; no varicose veins/ venous insuffic/ or edema. NEURO:  CN's intact; motor testing normal; sensory testing normal; gait normal & balance OK. DERM:  No lesions noted; no rash etc...  RADIOLOGY DATA:  Reviewed in the EPIC EMR & discussed w/ the patient...    >>CXR 3/12 showed clear lungs, NAD...  LABORATORY DATA:  Reviewed in the EPIC EMR & discussed w/ the patient...    >>due for fasting blood work & he will return to our lab at his convenience...   Assessment:     HBP>   Controlled on the Amlodipine 5mg /d; continue same, no salt, etc...  Hx PAF>  Maintaining NSR w/o CP, palpit, etc...  CHOL>  Stable on Simva20 w/ f/u FLP pending...  ORTHO> DJD, Gout>  Stable w/ OTC analgesics, no clinical gout attacks, we reviewed exercise recommendations...  Hx psychiatric illness>  Followed by psychiatrist [name] in [place], Evalee Jefferson on EffexorXR 75mg - 2 daily & Klonopin 0.5mg  bid...     Plan:     Patient's Medications  New Prescriptions   No medications on file  Previous Medications   ASPIRIN 81 MG TABLET    Take 81 mg by mouth daily.     CLONAZEPAM (KLONOPIN) 0.5 MG TABLET    Take 0.5 mg by mouth 2 (two) times daily as needed.     COENZYME Q10 (COQ10) 100 MG CAPS    Take 1 capsule by mouth daily.     FLAXSEED, LINSEED, (FLAXSEED OIL) 1200 MG CAPS    Take 1 capsule by mouth daily.     GLUCOSAMINE-CHONDROIT-VIT C-MN (GLUCOSAMINE 1500 COMPLEX PO)    Take 1 capsule by mouth daily.     MULTIPLE VITAMINS-MINERALS (MULTIVITAMIN PO)    Take 1 tablet by mouth daily.     MULTIVITAMIN (THERAGRAN) PER TABLET    Take 1 tablet by mouth daily.     OMEGA-3 FATTY ACIDS (FISH OIL) 1200 MG CAPS    Take 1 capsule by mouth daily.     SIMVASTATIN (ZOCOR) 40 MG TABLET    Take 1/2 tablet by mouth at bedtime   VENLAFAXINE (EFFEXOR) 75 MG TABLET    Take one tablet by mouth two times daily  Modified Medications   Modified Medication Previous Medication   AMLODIPINE (NORVASC) 5 MG TABLET amLODipine (NORVASC) 5 MG tablet      Take 1 tablet (5 mg total) by mouth daily.    Take 1 tablet (5 mg total) by mouth daily.  Discontinued Medications   ALPRAZOLAM (XANAX) 0.25 MG TABLET    Take 1 tablet (0.25 mg total) by mouth at bedtime as needed for anxiety.

## 2012-01-16 ENCOUNTER — Other Ambulatory Visit (INDEPENDENT_AMBULATORY_CARE_PROVIDER_SITE_OTHER): Payer: 59

## 2012-01-16 DIAGNOSIS — N139 Obstructive and reflux uropathy, unspecified: Secondary | ICD-10-CM

## 2012-01-16 DIAGNOSIS — I10 Essential (primary) hypertension: Secondary | ICD-10-CM

## 2012-01-16 DIAGNOSIS — Z8679 Personal history of other diseases of the circulatory system: Secondary | ICD-10-CM

## 2012-01-16 DIAGNOSIS — F411 Generalized anxiety disorder: Secondary | ICD-10-CM

## 2012-01-16 DIAGNOSIS — E7800 Pure hypercholesterolemia, unspecified: Secondary | ICD-10-CM

## 2012-01-16 DIAGNOSIS — F419 Anxiety disorder, unspecified: Secondary | ICD-10-CM

## 2012-01-16 LAB — HEPATIC FUNCTION PANEL
ALT: 21 U/L (ref 0–53)
Bilirubin, Direct: 0.1 mg/dL (ref 0.0–0.3)
Total Bilirubin: 0.8 mg/dL (ref 0.3–1.2)
Total Protein: 6.6 g/dL (ref 6.0–8.3)

## 2012-01-16 LAB — CBC WITH DIFFERENTIAL/PLATELET
Basophils Absolute: 0 K/uL (ref 0.0–0.1)
Basophils Relative: 0.4 % (ref 0.0–3.0)
Eosinophils Absolute: 0.2 K/uL (ref 0.0–0.7)
Eosinophils Relative: 3.7 % (ref 0.0–5.0)
HCT: 39.9 % (ref 39.0–52.0)
Hemoglobin: 13.7 g/dL (ref 13.0–17.0)
Lymphocytes Relative: 27.7 % (ref 12.0–46.0)
Lymphs Abs: 1.8 K/uL (ref 0.7–4.0)
MCHC: 34.4 g/dL (ref 30.0–36.0)
MCV: 92.2 fl (ref 78.0–100.0)
Monocytes Absolute: 0.5 K/uL (ref 0.1–1.0)
Monocytes Relative: 6.9 % (ref 3.0–12.0)
Neutro Abs: 4.1 K/uL (ref 1.4–7.7)
Neutrophils Relative %: 61.3 % (ref 43.0–77.0)
Platelets: 221 K/uL (ref 150.0–400.0)
RBC: 4.33 Mil/uL (ref 4.22–5.81)
RDW: 13.3 % (ref 11.5–14.6)
WBC: 6.6 K/uL (ref 4.5–10.5)

## 2012-01-16 LAB — LIPID PANEL
Cholesterol: 144 mg/dL (ref 0–200)
HDL: 49.2 mg/dL
LDL Cholesterol: 83 mg/dL (ref 0–99)
Total CHOL/HDL Ratio: 3
Triglycerides: 61 mg/dL (ref 0.0–149.0)
VLDL: 12.2 mg/dL (ref 0.0–40.0)

## 2012-01-16 LAB — BASIC METABOLIC PANEL
BUN: 13 mg/dL (ref 6–23)
CO2: 25 mEq/L (ref 19–32)
Calcium: 9 mg/dL (ref 8.4–10.5)
Chloride: 104 mEq/L (ref 96–112)
Creatinine, Ser: 0.8 mg/dL (ref 0.4–1.5)

## 2012-01-16 LAB — PSA: PSA: 0.53 ng/mL (ref 0.10–4.00)

## 2012-01-17 LAB — VITAMIN D 25 HYDROXY (VIT D DEFICIENCY, FRACTURES): Vit D, 25-Hydroxy: 15 ng/mL — ABNORMAL LOW (ref 30–89)

## 2012-01-23 ENCOUNTER — Other Ambulatory Visit: Payer: Self-pay | Admitting: *Deleted

## 2012-01-23 MED ORDER — VITAMIN D (ERGOCALCIFEROL) 1.25 MG (50000 UNIT) PO CAPS: 50000.0000 [IU] | ORAL_CAPSULE | ORAL | Status: DC

## 2012-03-06 ENCOUNTER — Telehealth: Payer: Self-pay | Admitting: Pulmonary Disease

## 2012-03-06 NOTE — Telephone Encounter (Signed)
I called the pharmacy and they have refills for the pt. Pt wife is aware. Carron Curie, CMA

## 2012-03-25 ENCOUNTER — Other Ambulatory Visit: Payer: Self-pay | Admitting: Pulmonary Disease

## 2012-03-28 ENCOUNTER — Telehealth: Payer: Self-pay | Admitting: Pulmonary Disease

## 2012-03-28 NOTE — Telephone Encounter (Signed)
[facility] aware it is okay to fill simvastatin as RX'd and SN aware pt is also taking amlodipine. If pt has any muscle complaints he needs to contact SN or make an OV.

## 2012-06-22 ENCOUNTER — Other Ambulatory Visit: Payer: Self-pay | Admitting: Pulmonary Disease

## 2012-06-22 MED ORDER — VITAMIN D (ERGOCALCIFEROL) 1.25 MG (50000 UNIT) PO CAPS: 50000.0000 [IU] | ORAL_CAPSULE | ORAL | Status: DC

## 2012-06-22 MED ORDER — AMLODIPINE BESYLATE 5 MG PO TABS: 5.0000 mg | ORAL_TABLET | Freq: Every day | ORAL | Status: DC

## 2012-06-22 NOTE — Telephone Encounter (Signed)
[facility] REQUESTING 90 DAY SUPPLY ON BOTH VITAMIN D 50,000 U  AMLODIPINE BESYLATE  5 MG  RX SENT

## 2012-07-03 ENCOUNTER — Ambulatory Visit (INDEPENDENT_AMBULATORY_CARE_PROVIDER_SITE_OTHER): Payer: 59 | Admitting: Pulmonary Disease

## 2012-07-03 ENCOUNTER — Encounter: Payer: Self-pay | Admitting: Pulmonary Disease

## 2012-07-03 VITALS — BP 108/78 | HR 90 | Temp 97.1°F | Ht 74.0 in | Wt 227.2 lb

## 2012-07-03 DIAGNOSIS — M1990 Unspecified osteoarthritis, unspecified site: Secondary | ICD-10-CM

## 2012-07-03 DIAGNOSIS — E7800 Pure hypercholesterolemia, unspecified: Secondary | ICD-10-CM

## 2012-07-03 DIAGNOSIS — F489 Nonpsychotic mental disorder, unspecified: Secondary | ICD-10-CM

## 2012-07-03 DIAGNOSIS — I10 Essential (primary) hypertension: Secondary | ICD-10-CM

## 2012-07-03 NOTE — Patient Instructions (Addendum)
Today we updated your med list in our EPIC system...    Continue your current medications the same...  We discussed the Shingles vaccine today> let me know if your decide in favor of this & we will write a prescription for you...  Call for any questions...    Stay as active as possible...  Let's plan a follow up visit in 6 months w/ CXR & FASTING blood work at that time.Marland KitchenMarland Kitchen

## 2012-07-03 NOTE — Progress Notes (Signed)
Subjective:     Patient ID: Jason [name], male   DOB: Sep 26, 1941, 71 y.o.   MRN: [id number]  HPI 71 y/o WM here for a follow up appt... he is the son-in-law of [name]...   ~  November 20, 2009:  here for CPX- feeling well without new complaints or concerns... he's lost 15# on diet + exercise- walks, plays golf... denies CP, palpit, etc...  ~  March 03, 2011:  8mo ROV & review of medical problems including remote hx PAF; Hyperchol; Overweight; DJD & ?hx of gout; hx of Psyche disorder...     ?Hx of PAF: Remote hx PAF (details unknown), holding NSR w/o ectopy since we have known him starting in 2007... States he has occas palpit, skips, nothing sustained, & noted esp when stressed, denies symptoms w/ exercise, no CP/ SOB/ edema/ etc... Not currently on meds and we discussed avoiding caffeine, pseudophed, etc> further eval if symptoms worsen.     Hyperchol: Prev labs looked satis on Simva40 but he cut back on his own to 20mg /d "due to everything that I heard on TV"; FLP on the 20mg /d dose looks ok!    DJD: He uses OTC Ibuprofen Prn; no specific arthritis complaints at this time.     Psyche: Hx "physical breakdown" age 20, followed by [place] in [place] on [facility] 75mg /d, stable & doing well by his hx.   ~  January 03, 2012:  69mo ROV & he notes under incr stress due to death of 2 friends this past yr, sister dx w/ alz, & nephew's child has autism; he is torn betw retiring or not;  Due to all this his BP has been elevated> seen in ER & started on Norvasc5mg ;  He saw TP & Norvasc helping, she rec f/u w/ Psychiatrist in [place] & DrAshby increased his EffexorXR75 to 2/d & added Klonopin 0.5mg  Bid==> improved;  [name] requested that we check his VitD level & he will return for Fasting blood work at his convenience... LABS 1/13:  FLP- at goals on Simva20;  Chems- wnl;  CBC- wnl;  TSH=1.40;  PSA=0.53;  VitD=15...   ~  July 03, 2012:  32mo ROV & Aleksa is improved, feeling well w/o new complaints or  concerns... BP controlled on meds; denies CP, palpit, dizzy, SOB, etc; Chol looks good on Simva20;  See prob list below for updates >>          Problem List:  Hx of PNEUMONIA (ICD-486) - he had "walking pneumonia" in 2006 treated by [facility];  He is a never smoker...  HYPERTENSION >> mild HBP assoc w/ stress & went to ER in 2012 w/ NORVASC 5mg /d started; f/u BP checks here ok on the Norvasc- continue same. ~  1/13:  BP= 110/88 & feeling better> denies CP, palpit, dizzy, SOB, edema, or cerebral ischemic symptoms... ~  7/13:  BP= 108/78 & he remains asymptomatic  Hx of PAROXYSMAL ATRIAL FIBRILLATION (ICD-427.31) - on ASA 81mg /d...remote hx of PAF- details unknown... he has been in NSR without ectopy since we have known him starting in Jul07... denies CP, palit, dizzy, syncope, SOB, edema, etc...  He knows to avoid caffeine, pseudoephedrine etc...  HYPERCHOLESTEROLEMIA (ICD-272.0) - on SIMVASTATIN 40mg - taking 1/2 tab daily + FishOil etc... ~  FLP 7/07 on diet alone showed TChol 254, TG 70, HDL 48, LDL 209... needs Rx- start Simvastatin40. ~  FLP 9/07 on Zocor40 showed TChol 150, TG 46, HDL 46, LDL 94... great! continue same... ~  FLP 7/09  on Simva40 showed TChol 168, TG 79, HDL 48, LDL 105 ~  FLP 11/10 on Simva40 showed TChol 152, TG 63, HDL 55, LDL 84... He decr Simva to 20mg /d on his own. ~  FLP 3/12 on Simva20 showed TChol 165, TG 59, HDL 50, LDL 103 ~  FLP 3/13 on Simva20 showed TChol 144, TG 61, HDL 49, LDL 83... Continue same.  DEGENERATIVE JOINT DISEASE, MILD (ICD-715.90) GOUT (ICD-274.9) - ? hx of gout in the past... treated at [facility] w/ "antibiotic" per pt... he uses OTC Rx Prn (Ibuprofen, cherry extract)... ~  labs 11/10 showed Uric= 7.5 (4-7.8)...  VITAMIN D DEFICIENCY >> on VIT D 50K weekly... ~  Labs 1/13 showed Vit D level = 15... rec to start on Vit D 50K weekly...  Hx of PSYCHIATRIC DISORDER (ICD-300.9) - hx of "physical breakdown" age 71... followed by [name], Psychiatrist in  [place] on [facility] XR 75mg /d... ~  2012:  Pt noted increased stress & DrAshby icreased his EFFEXOR XR to 150mg /d & added KLONOPIN 0.5mg  Bid...  Hx SHINGLES >> he reports episode of shingles involving the right side of his face in ~1998... ~  7/13:  We discussed shingles vaccine but he has decided to hold off for now...  Health Maintenance:  ~ GI: Followed by DrJacobs, neg colon 2007- no divertics, no polyps, f/u due 2017.  ~ GU: Asymptomatic, norm DRE, norm PSAs, atrophic testes on exam- feels well, good energy, etc.  ~ Immunizations: He gets the yearly flu vaccine; Had PNEUMOVAX & TETANUS in 2007 (age 71).   No past surgical history on file.   Outpatient Encounter Prescriptions as of 07/03/2012  Medication Sig Dispense Refill  . amLODipine (NORVASC) 5 MG tablet Take 1 tablet (5 mg total) by mouth daily.  90 tablet  4  . aspirin 81 MG tablet Take 81 mg by mouth daily.        . clonazePAM (KLONOPIN) 0.5 MG tablet Take 0.5 mg by mouth 2 (two) times daily as needed.        . Coenzyme Q10 (COQ10) 100 MG CAPS Take 1 capsule by mouth daily.        . Glucosamine-Chondroit-Vit C-Mn (GLUCOSAMINE 1500 COMPLEX PO) Take 1 capsule by mouth daily.        . Multiple Vitamins-Minerals (MULTIVITAMIN PO) Take 1 tablet by mouth daily.        . Omega-3 Fatty Acids (FISH OIL) 1200 MG CAPS Take 1 capsule by mouth daily.        . simvastatin (ZOCOR) 40 MG tablet TAKE AS DIRECTED  90 tablet  3  . venlafaxine (EFFEXOR) 75 MG tablet Take one tablet by mouth two times daily      . Vitamin D, Ergocalciferol, (DRISDOL) 50000 UNITS CAPS Take 1 capsule (50,000 Units total) by mouth every 7 (seven) days.  12 capsule  3  . DISCONTD: Flaxseed, Linseed, (FLAXSEED OIL) 1200 MG CAPS Take 1 capsule by mouth daily.        Marland Kitchen DISCONTD: multivitamin (THERAGRAN) per tablet Take 1 tablet by mouth daily.          No Known Allergies   Current Medications, Allergies, Past Medical History, Past Surgical History, Family History,  and Social History were reviewed in [employer] record.    Review of Systems         The patient complains of joint pain and arthritis.  The patient denies fever, chills, sweats, anorexia, fatigue, weakness, malaise, weight loss, sleep disorder, blurring,  diplopia, eye irritation, eye discharge, vision loss, eye pain, photophobia, earache, ear discharge, tinnitus, decreased hearing, nasal congestion, nosebleeds, sore throat, hoarseness, chest pain, palpitations, syncope, dyspnea on exertion, orthopnea, PND, peripheral edema, cough, dyspnea at rest, excessive sputum, hemoptysis, wheezing, pleurisy, nausea, vomiting, diarrhea, constipation, change in bowel habits, abdominal pain, melena, hematochezia, jaundice, gas/bloating, indigestion/heartburn, dysphagia, odynophagia, dysuria, hematuria, urinary frequency, urinary hesitancy, nocturia, incontinence, back pain, joint swelling, muscle cramps, muscle weakness, stiffness, sciatica, restless legs, leg pain at night, leg pain with exertion, rash, itching, dryness, suspicious lesions, paralysis, paresthesias, seizures, tremors, vertigo, transient blindness, frequent falls, frequent headaches, difficulty walking, depression, anxiety, memory loss, confusion, cold intolerance, heat intolerance, polydipsia, polyphagia, polyuria, unusual weight change, abnormal bruising, bleeding, enlarged lymph nodes, urticaria, allergic rash, hay fever, and recurrent infections.     Objective:   Physical Exam     WD, WN, 71 y/o WM in NAD... GENERAL:  Alert & oriented; pleasant & cooperative... HEENT:  [place]/AT, EOM-wnl, PERRLA, Fundi-benign, EACs-clear, TMs-wnl, NOSE-clear, THROAT-clear & wnl. NECK:  Supple w/ fairROM; no JVD; normal carotid impulses w/o bruits; no thyromegaly or nodules palpated; no lymphadenopathy. CHEST:  Clear to P & A; without wheezes/ rales/ or rhonchi. HEART:  Regular Rhythm; without murmurs/ rubs/ or gallops. ABDOMEN:  Soft &  nontender; normal bowel sounds; no organomegaly or masses detected. RECTAL:  Neg - prostate 3+ & nontender w/o nodules; stool hematest neg. EXT: without deformities, mild arthritic changes; no varicose veins/ venous insuffic/ or edema. NEURO:  CN's intact; motor testing normal; sensory testing normal; gait normal & balance OK. DERM:  No lesions noted; no rash etc...  RADIOLOGY DATA:  Reviewed in the EPIC EMR & discussed w/ the patient...    >>CXR 3/12 showed clear lungs, NAD...  LABORATORY DATA:  Reviewed in the EPIC EMR & discussed w/ the patient...    >>due for fasting blood work & he will return to our lab at his convenience...   Assessment:     HBP>  Controlled on the Amlodipine 5mg /d; continue same, no salt, etc...  Hx PAF>  Maintaining NSR w/o CP, palpit, etc...  CHOL>  Stable on Simva20 w/ FLP at goal...  ORTHO> DJD, Gout>  Stable w/ OTC analgesics, no clinical gout attacks, we reviewed exercise recommendations...  Vit D Defic>  On Vit D 50K weekly...  Hx psychiatric illness>  Followed by psychiatrist [name] in [place], Evalee Jefferson on EffexorXR 75mg - 2 daily & Klonopin 0.5mg  bid...     Plan:     Patient's Medications  New Prescriptions   No medications on file  Previous Medications   AMLODIPINE (NORVASC) 5 MG TABLET    Take 1 tablet (5 mg total) by mouth daily.   ASPIRIN 81 MG TABLET    Take 81 mg by mouth daily.     CLONAZEPAM (KLONOPIN) 0.5 MG TABLET    Take 0.5 mg by mouth 2 (two) times daily as needed.     COENZYME Q10 (COQ10) 100 MG CAPS    Take 1 capsule by mouth daily.     GLUCOSAMINE-CHONDROIT-VIT C-MN (GLUCOSAMINE 1500 COMPLEX PO)    Take 1 capsule by mouth daily.     MULTIPLE VITAMINS-MINERALS (MULTIVITAMIN PO)    Take 1 tablet by mouth daily.     OMEGA-3 FATTY ACIDS (FISH OIL) 1200 MG CAPS    Take 1 capsule by mouth daily.     SIMVASTATIN (ZOCOR) 40 MG TABLET    TAKE AS DIRECTED   VENLAFAXINE (EFFEXOR) 75 MG TABLET  Take one tablet by mouth two times daily    VITAMIN D, ERGOCALCIFEROL, (DRISDOL) 50000 UNITS CAPS    Take 1 capsule (50,000 Units total) by mouth every 7 (seven) days.  Modified Medications   No medications on file  Discontinued Medications   FLAXSEED, LINSEED, (FLAXSEED OIL) 1200 MG CAPS    Take 1 capsule by mouth daily.     MULTIVITAMIN (THERAGRAN) PER TABLET    Take 1 tablet by mouth daily.

## 2012-08-13 ENCOUNTER — Telehealth: Payer: Self-pay | Admitting: Pulmonary Disease

## 2012-08-13 MED ORDER — CLONAZEPAM 0.5 MG PO TABS: 0.5000 mg | ORAL_TABLET | Freq: Two times a day (BID) | ORAL | Status: DC | PRN

## 2012-08-13 MED ORDER — VENLAFAXINE HCL 75 MG PO TABS: ORAL_TABLET | ORAL | Status: DC

## 2012-08-13 NOTE — Telephone Encounter (Signed)
Spoke with pt. He states that his psychiatrist,  Dr. Lamount Cranker in [place], [place] has passed away. Now he is needing referral or suggestion for a new psychiatrist and also rxs called in for clonazepam 0.5 mg bid prn and effexor 75 mg 1 bid until he gets new doc. He only has a few tablets left of each and wants rxs called to [facility]. Please advise thanks! No Known Allergies

## 2012-08-13 NOTE — Telephone Encounter (Signed)
Per SN: rec Dr Donell Beers in [place].  Call [phone] and give them all his information.  In the meantime, ok to fill his klonopin 0.5mg  BID prn #60 with 1 refill and effexor 75mg  BID #60 with 1 refill.  Thanks.  Called spoke with patient and wife (was on speaker phone) and advised of SN's recs as stated above.  Pt verbalized his understanding.  Pt requested his medications be sent to [facility], though he is aware that they are not for a full 90 day supply.  Rx's telephoned to [facility] > telephoned as #120 with no refills since called to a mail-order pharmacy.  Klonopin 0.5mg  printed for SN to sign and faxed to [facility].  Called [facility], spoke with Abelino Derrick and authorized refill on effexor 75mg .  Will sign off.

## 2012-09-07 ENCOUNTER — Encounter (HOSPITAL_COMMUNITY): Payer: Self-pay | Admitting: Psychiatry

## 2012-09-07 ENCOUNTER — Ambulatory Visit (INDEPENDENT_AMBULATORY_CARE_PROVIDER_SITE_OTHER): Payer: 59 | Admitting: Psychiatry

## 2012-09-07 DIAGNOSIS — F332 Major depressive disorder, recurrent severe without psychotic features: Secondary | ICD-10-CM

## 2012-09-07 DIAGNOSIS — F3342 Major depressive disorder, recurrent, in full remission: Secondary | ICD-10-CM

## 2012-09-07 NOTE — Progress Notes (Signed)
Psychiatric Assessment Adult  Patient Identification:  Jason [name] Date of Evaluation:  09/07/2012 Chief Complaint: need a new Psychiatrist History of Chief Complaint:  No chief complaint on file. this patient is a 71 year old retired male who is married, has no children and is here to establish a new relationship with a Therapist, sports. His previous psychiatrist in [place] died of cancer 3 weeks ago. The patient lives in [place] with the wife of 18 years. They have a good stable relationship with no children. In April of this year the patient retired from a job a 42 years. Initially it was a [employer] and he felt uncomfortable but now he feels better. Financially he is stable. Noted also a significant stresses the death of 2 best friends in the last year of cancer. The patient's health is stable although he describes himself as hypochondriacal. In reality his health is good. Over the last few months the patient denies any depression. He is sleeping and eating well and has good energy. He enjoys golf sports watching TV and church activities. He is good energy and a good sense of self-worth. He is not suicidal now nor has he ever been. He denies the use of alcohol or symptoms of psychosis. He describes a past episode in 1990 where he felt persistently depressed with problems with sleep and energy. At that time he was admitted to a psychiatric hospital in [place] where he was begun on high dose Effexor and also received 3 ECT treatments. As the only time he has ever been in the hospital for psychiatric care. He's been followed very closely by Dr. Cleon Gustin in [place] until the psychiatrist's death. In addition to a fixed dose of Effexor over the last few years she's begun on a low dose of Klonopin 0.5 mg twice a day.  HPI Review of Systems Physical Exam  Depressive Symptoms: depressed mood,  (Hypo) Manic Symptoms:   Elevated Mood:  No Irritable Mood:  No Grandiosity:  No Distractibility:   No Labiality of Mood:  No Delusions:  No Hallucinations:  No Impulsivity:  No Sexually Inappropriate Behavior:  No Financial Extravagance:  No Flight of Ideas:  No  Anxiety Symptoms: Excessive Worry:  No Panic Symptoms:  No Agoraphobia:  No Obsessive Compulsive: No  Symptoms: None, Specific Phobias:  No Social Anxiety:  No  Psychotic Symptoms:  Hallucinations: No None Delusions:  No Paranoia:  No   Ideas of Reference:  No  PTSD Symptoms: Ever had a traumatic exposure:  No Had a traumatic exposure in the last month:  No Re-experiencing: No Nightmares None Hypervigilance:  No Hyperarousal: No None Avoidance: No None  Traumatic Brain Injury: No   Past Psychiatric History: Diagnosis:Major Depression  Hospitalizations: 1 x  1990  Outpatient Care:   Substance Abuse Care:   Self-Mutilation:   Suicidal Attempts:   Violent Behaviors:    Past Medical History:   Past Medical History  Diagnosis Date  . Routine general medical examination at a health care facility   . Pneumonia, organism unspecified   . Atrial fibrillation   . Pure hypercholesterolemia   . Osteoarthrosis, unspecified whether generalized or localized, unspecified site   . Gout, unspecified   . Unspecified nonpsychotic mental disorder    History of Loss of Consciousness:   Seizure History:   Cardiac History:   Allergies:  No Known Allergies Current Medications:  Current Outpatient Prescriptions  Medication Sig Dispense Refill  . amLODipine (NORVASC) 5 MG tablet Take 1 tablet (5 mg total)  by mouth daily.  90 tablet  4  . aspirin 81 MG tablet Take 81 mg by mouth daily.        . clonazePAM (KLONOPIN) 0.5 MG tablet Take 1 tablet (0.5 mg total) by mouth 2 (two) times daily as needed.  120 tablet  0  . Coenzyme Q10 (COQ10) 100 MG CAPS Take 1 capsule by mouth daily.        . Glucosamine-Chondroit-Vit C-Mn (GLUCOSAMINE 1500 COMPLEX PO) Take 1 capsule by mouth daily.        . Multiple Vitamins-Minerals  (MULTIVITAMIN PO) Take 1 tablet by mouth daily.        . Omega-3 Fatty Acids (FISH OIL) 1200 MG CAPS Take 1 capsule by mouth daily.        . simvastatin (ZOCOR) 40 MG tablet TAKE AS DIRECTED  90 tablet  3  . venlafaxine (EFFEXOR) 75 MG tablet Take one tablet by mouth two times daily  120 tablet  0  . Vitamin D, Ergocalciferol, (DRISDOL) 50000 UNITS CAPS Take 1 capsule (50,000 Units total) by mouth every 7 (seven) days.  12 capsule  3    Previous Psychotropic Medications:  Medication Dose                          Substance Abuse History in the last 12 months:                                                                                                   Medical Consequences of Substance Abuse:   Legal Consequences of Substance Abuse:   Family Consequences of Substance Abuse:   Blackouts:   DT's:   Withdrawal Symptoms:   None  Social History: Current Place of Residence: [place] of Birth:  Family Members:  Marital Status:  Married Children: 0  Sons:   Daughters:  Relationships:  Education:  Print production planner Problems/Performance:  Religious Beliefs/Practices:  History of Abuse:  Teacher, music History:   Legal History:  Hobbies/Interests:   Family History:   Family History  Problem Relation Age of Onset  . Dementia Mother   . Pneumonia Father   . Heart failure Father   . Arthritis Other     Mental Status Examination/Evaluation: Objective:  Appearance: Casual  Eye Contact::  Good  Speech:  Clear and Coherent  Volume:  Normal  Mood:  good  Affect:  Appropriate  Thought Process:  Coherent  Orientation:  Full  Thought Content:  WDL  Suicidal Thoughts:  No  Homicidal Thoughts:  No  Judgement:  Good  Insight:  Good  Psychomotor Activity:  Normal  Akathisia:  No  Handed:  Right  AIMS (if indicated):    Assets:  Desire for Improvement    Laboratory/X-Ray Psychological Evaluation(s)         Assessment:  Axis I: Major Depression, Recurrent severe  AXIS I Major Depression, Recurrent severe  AXIS II No diagnosis  AXIS III Past Medical History  Diagnosis Date  . Routine general medical examination at a health care facility   .  Pneumonia, organism unspecified   . Atrial fibrillation   . Pure hypercholesterolemia   . Osteoarthrosis, unspecified whether generalized or localized, unspecified site   . Gout, unspecified   . Unspecified nonpsychotic mental disorder      AXIS IV problems with primary support group  AXIS V 61-70 mild symptoms   Treatment Plan/Recommendations:  Plan of Care:at this time the patient will continue taking Effexor 75 mg 2 in the morning. The patient also takes Klonopin 0.5 mg twice a day. He says he has enough of these medicines for at least the next 2 months. Noted is that he gets his medicines through [facility].  Laboratory:    Psychotherapy:   Medications:   Routine PRN Medications:    Consultations:   Safety Concerns:    Other:      Lucas Mallow, MD 9/13/201310:38 AM

## 2012-10-03 ENCOUNTER — Ambulatory Visit (INDEPENDENT_AMBULATORY_CARE_PROVIDER_SITE_OTHER): Payer: Medicare Other

## 2012-10-03 DIAGNOSIS — Z23 Encounter for immunization: Secondary | ICD-10-CM

## 2012-10-05 DIAGNOSIS — Z23 Encounter for immunization: Secondary | ICD-10-CM | POA: Diagnosis not present

## 2012-10-24 DIAGNOSIS — F332 Major depressive disorder, recurrent severe without psychotic features: Secondary | ICD-10-CM | POA: Diagnosis not present

## 2012-11-09 ENCOUNTER — Ambulatory Visit (HOSPITAL_COMMUNITY): Payer: Self-pay | Admitting: Psychiatry

## 2012-11-09 DIAGNOSIS — F332 Major depressive disorder, recurrent severe without psychotic features: Secondary | ICD-10-CM | POA: Diagnosis not present

## 2012-11-12 DIAGNOSIS — N419 Inflammatory disease of prostate, unspecified: Secondary | ICD-10-CM | POA: Diagnosis not present

## 2012-11-13 ENCOUNTER — Telehealth: Payer: Self-pay | Admitting: Pulmonary Disease

## 2012-11-13 NOTE — Telephone Encounter (Signed)
Called and spoke with patient, patient states that he went to see [facility] after having some mild burning w urination.  Patient states he ahs dropped paperwork from that appt off at our office for Dr. Kriste Basque to review and offer his suggestion/recs.  Patient would just like a call once Dr. Kriste Basque has had time to go over notes.  Will forward to Leigh to look out for paperwork.

## 2012-11-14 NOTE — Telephone Encounter (Signed)
Per SN---this cipro is frequently used for a chronic infection to help you get over it then they  Repeat the studies--UA, PSA, etc.  Dr. Vernie Ammons is very thorough in his work.  The only other recs that SN has is to take align 1 daily while on the abx.  thanks

## 2012-11-14 NOTE — Telephone Encounter (Signed)
Called and spoke with patient, informed him of recs/interpretation per Dr. Kriste Basque as listed below.  Patient verbalized understanding and nothing further needed at this time.

## 2012-11-30 DIAGNOSIS — M779 Enthesopathy, unspecified: Secondary | ICD-10-CM | POA: Diagnosis not present

## 2012-12-07 DIAGNOSIS — M779 Enthesopathy, unspecified: Secondary | ICD-10-CM | POA: Diagnosis not present

## 2012-12-12 DIAGNOSIS — F332 Major depressive disorder, recurrent severe without psychotic features: Secondary | ICD-10-CM | POA: Diagnosis not present

## 2012-12-25 DIAGNOSIS — N400 Enlarged prostate without lower urinary tract symptoms: Secondary | ICD-10-CM | POA: Diagnosis not present

## 2012-12-25 DIAGNOSIS — N419 Inflammatory disease of prostate, unspecified: Secondary | ICD-10-CM | POA: Diagnosis not present

## 2013-01-07 ENCOUNTER — Ambulatory Visit (INDEPENDENT_AMBULATORY_CARE_PROVIDER_SITE_OTHER): Payer: Medicare Other | Admitting: Pulmonary Disease

## 2013-01-07 ENCOUNTER — Other Ambulatory Visit (INDEPENDENT_AMBULATORY_CARE_PROVIDER_SITE_OTHER): Payer: Medicare Other

## 2013-01-07 ENCOUNTER — Encounter: Payer: Self-pay | Admitting: Pulmonary Disease

## 2013-01-07 ENCOUNTER — Ambulatory Visit (INDEPENDENT_AMBULATORY_CARE_PROVIDER_SITE_OTHER)
Admission: RE | Admit: 2013-01-07 | Discharge: 2013-01-07 | Disposition: A | Discharge status: Home / Self Care | Payer: Medicare Other | Source: Ambulatory Visit | Attending: Pulmonary Disease | Admitting: Pulmonary Disease

## 2013-01-07 VITALS — BP 120/84 | HR 95 | Temp 98.5°F | Ht 74.0 in | Wt 226.0 lb

## 2013-01-07 DIAGNOSIS — Z8679 Personal history of other diseases of the circulatory system: Secondary | ICD-10-CM

## 2013-01-07 DIAGNOSIS — M1990 Unspecified osteoarthritis, unspecified site: Secondary | ICD-10-CM | POA: Diagnosis not present

## 2013-01-07 DIAGNOSIS — I10 Essential (primary) hypertension: Secondary | ICD-10-CM

## 2013-01-07 DIAGNOSIS — F411 Generalized anxiety disorder: Secondary | ICD-10-CM | POA: Diagnosis not present

## 2013-01-07 DIAGNOSIS — F419 Anxiety disorder, unspecified: Secondary | ICD-10-CM

## 2013-01-07 DIAGNOSIS — E7800 Pure hypercholesterolemia, unspecified: Secondary | ICD-10-CM

## 2013-01-07 DIAGNOSIS — Z1389 Encounter for screening for other disorder: Secondary | ICD-10-CM

## 2013-01-07 DIAGNOSIS — E559 Vitamin D deficiency, unspecified: Secondary | ICD-10-CM | POA: Diagnosis not present

## 2013-01-07 DIAGNOSIS — F489 Nonpsychotic mental disorder, unspecified: Secondary | ICD-10-CM

## 2013-01-07 LAB — CBC WITH DIFFERENTIAL/PLATELET
Basophils Relative: 0.5 % (ref 0.0–3.0)
Eosinophils Relative: 3.1 % (ref 0.0–5.0)
Hemoglobin: 13.8 g/dL (ref 13.0–17.0)
Lymphocytes Relative: 26.5 % (ref 12.0–46.0)
Monocytes Relative: 7.8 % (ref 3.0–12.0)
Neutro Abs: 4.2 10*3/uL (ref 1.4–7.7)
RBC: 4.51 Mil/uL (ref 4.22–5.81)

## 2013-01-07 LAB — LIPID PANEL
HDL: 58 mg/dL (ref >39.00)
Total CHOL/HDL Ratio: 3
Triglycerides: 82 mg/dL (ref 0.0–149.0)
VLDL: 16.4 mg/dL (ref 0.0–40.0)

## 2013-01-07 LAB — BASIC METABOLIC PANEL
CO2: 28 mEq/L (ref 19–32)
Calcium: 9.5 mg/dL (ref 8.4–10.5)
Creatinine, Ser: 0.8 mg/dL (ref 0.4–1.5)
Glucose, Bld: 94 mg/dL (ref 70–99)

## 2013-01-07 LAB — HEPATIC FUNCTION PANEL
Albumin: 4.1 g/dL (ref 3.5–5.2)
Total Protein: 7 g/dL (ref 6.0–8.3)

## 2013-01-07 LAB — URIC ACID: Uric Acid, Serum: 7.8 mg/dL (ref 4.0–7.8)

## 2013-01-07 NOTE — Patient Instructions (Addendum)
Today we updated your med list in our EPIC system...    Continue your current medications the same...    We refilled your meds per request...  Today we did your follow up CXR, EKG, & FASTING blood work...    We will contact you w/ the results when avail...  Call for any questions or if we can be of service in any way...  Let's plan a brief f/u visit in 6 month's time.Marland KitchenMarland Kitchen

## 2013-01-07 NOTE — Progress Notes (Signed)
Subjective:     Patient ID: Jason [name], male   DOB: [birth year], 72 y.o.   MRN: [id number]  HPI 72 y/o WM here for a follow up appt... he is the son-in-law of Jason [name]...   ~  March 03, 2011:  34mo ROV & review of medical problems including remote hx PAF; Hyperchol; Overweight; DJD & ?hx of gout; hx of Psyche disorder...     ?Hx of PAF: Remote hx PAF (details unknown), holding NSR w/o ectopy since we have known him starting in 2007... States he has occas palpit, skips, nothing sustained, & noted esp when stressed, denies symptoms w/ exercise, no CP/ SOB/ edema/ etc... Not currently on meds and we discussed avoiding caffeine, pseudophed, etc> further eval if symptoms worsen.     Hyperchol: Prev labs looked satis on Simva40 but he cut back on his own to 20mg /d "due to everything that I heard on TV"; FLP on the 20mg /d dose looks ok!    DJD: He uses OTC Ibuprofen Prn; no specific arthritis complaints at this time.     Psyche: Hx "physical breakdown" age 3, followed by [place] in [place] on [facility] 75mg /d, stable & doing well by his hx.   ~  January 03, 2012:  87mo ROV & he notes under incr stress due to death of 2 friends this past yr, sister dx w/ alz, & nephew's child has autism; he is torn betw retiring or not;  Due to all this his BP has been elevated> seen in ER & started on Norvasc5mg ;  He saw TP & Norvasc helping, she rec f/u w/ Psychiatrist in [place] & DrAshby increased his EffexorXR75 to 2/d & added Klonopin 0.5mg  Bid==> improved;  [name] requested that we check his VitD level & he will return for Fasting blood work at his convenience... LABS 1/13:  FLP- at goals on Simva20;  Chems- wnl;  CBC- wnl;  TSH=1.40;  PSA=0.53;  VitD=15...   ~  July 03, 2012:  566mo ROV & Jason [name] is improved, feeling well w/o new complaints or concerns... BP controlled on meds; denies CP, palpit, dizzy, SOB, etc; Chol looks good on Simva20;  See prob list below for updates >>  ~  January 07, 2013:  566mo ROV & Jason [name]  has had a good interval, one bout of prostatitis treated by DrOttelin, otherw w/o new complaints or concerns;  We reviewed the following medical problems during today's office visit >>      HBP> on Amlod5; BP= 120/84 & similar at home; he denies CP, palpit, dizzy, SOB, edema, etc...    PAF> on ASA81; he is maintaining NSR- EKG w/ occas PACs; denies CP, palpit, etc...    Chol> on Simva40, FishOil, CoQ10; FLP shows TChol 197, TG 82, HDL 58, LDL 123; rec better diet, get wt down, same med...    GU- he had an epis of prostatitis treated by drOttelin w/ Cipro & resolved...    DJD> on Glucosamine & OTC analgesics prn...    Vit D defic> on VitD 50K weekly; Labs show VitD level = 32, rec to continue 50K weekly...    Hx Psychiatic disorder> on Klonopin0.5Bid, Cymbalta60, Remeron15-1/2 from DrCottle... We reviewed prob list, meds, xrays and labs> see below for updates >> he had the 2013 Flu vaccine 10/13 CXR 1/14 showed borderline heart size, clear lungs, NAD.Marland KitchenMarland Kitchen EKG 1/14 showed NSR, rate91, rare PAC, NSSTTWA, NAD... LABS 1/14:  FLP- ok x LDL=123 on Simva20;  Chems- wnl;  CBC- wnl;  TSH=1.38;  VitD=32;  Uric=7.8.Marland KitchenMarland Kitchen          Problem List:  Hx of PNEUMONIA (ICD-486) - he had "walking pneumonia" in 2006 treated by [facility];  He is a never smoker... ~  CXR 1/14 showed borderline heart size, clear lungs, NAD...  HYPERTENSION >> mild HBP assoc w/ stress & went to ER in 2012 w/ NORVASC 5mg /d started; f/u BP checks here ok on the Norvasc- continue same. ~  1/13:  BP= 110/88 & feeling better> denies CP, palpit, dizzy, SOB, edema, or cerebral ischemic symptoms... ~  7/13:  BP= 108/78 & he remains asymptomatic ~  1/14: on Amlod5; BP= 120/84 & similar at home; he denies CP, palpit, dizzy, SOB, edema, etc.  Hx of PAROXYSMAL ATRIAL FIBRILLATION (ICD-427.31) - on ASA 81mg /d...remote hx of PAF- details unknown... he has been in NSR without ectopy since we have known him starting in Jul07... denies CP, palpit, dizzy,  syncope, SOB, edema, etc...  He knows to avoid caffeine, pseudoephedrine etc... ~  EKG 1/14 showed NSR, rate91, rare PAC, NSSTTWA, NAD...   HYPERCHOLESTEROLEMIA (ICD-272.0) - on SIMVASTATIN 40mg - taking 1/2 tab daily + FishOil etc... ~  FLP 7/07 on diet alone showed TChol 254, TG 70, HDL 48, LDL 209... needs Rx- start Simvastatin40. ~  FLP 9/07 on Zocor40 showed TChol 150, TG 46, HDL 46, LDL 94... great! continue same... ~  FLP 7/09 on Simva40 showed TChol 168, TG 79, HDL 48, LDL 105 ~  FLP 11/10 on Simva40 showed TChol 152, TG 63, HDL 55, LDL 84... He decr Simva to 20mg /d on his own. ~  FLP 3/12 on Simva20 showed TChol 165, TG 59, HDL 50, LDL 103 ~  FLP 3/13 on Simva20 showed TChol 144, TG 61, HDL 49, LDL 83... Continue same. ~  FLP 1/14 on Simva20 showed TChol 197, TG 82, HDL 58, LDL 123  DEGENERATIVE JOINT DISEASE, MILD (ICD-715.90) GOUT (ICD-274.9) - ? hx of gout in the past... treated at [facility] w/ "antibiotic" per pt... he uses OTC Rx Prn (Ibuprofen, cherry extract)... ~  labs 11/10 showed Uric= 7.5 (4-7.8)... ~  Labs 1/14 showed Uric= 7.8  VITAMIN D DEFICIENCY >> on VIT D 50K weekly... ~  Labs 1/13 showed Vit D level = 15... rec to start on Vit D 50K weekly... ~  Labs 1/14 showed Vit D level = 32... Continue 50K weekly...  Hx of PSYCHIATRIC DISORDER (ICD-300.9) - hx of "physical breakdown" age 35... Prev followed by [name], Psychiatrist in [place] on [place]; now followed by DrCottle on Cymbalta, Remeron, Klonopin... ~  2012:  Pt noted increased stress & DrAshby icreased his EFFEXOR XR to 150mg /d & added KLONOPIN 0.5mg  Bid... ~  1/14: on Klonopin0.5Bid, Cymbalta60, Remeron15-1/2 from DrCottle...  Hx SHINGLES >> he reports episode of shingles involving the right side of his face in ~1998... ~  7/13:  We discussed shingles vaccine but he has decided to hold off for now...  Health Maintenance:  ~ GI: Followed by DrJacobs, neg colon 2007- no divertics, no polyps, f/u due 2017.  ~  GU: Asymptomatic, norm DRE, norm PSAs, atrophic testes on exam- feels well, good energy, etc.  ~ Immunizations: He gets the yearly flu vaccine; Had PNEUMOVAX & TETANUS in 2007 (age 52).   No past surgical history on file.   Outpatient Encounter Prescriptions as of 01/07/2013  Medication Sig Dispense Refill  . amLODipine (NORVASC) 5 MG tablet Take 1 tablet (5 mg total) by mouth daily.  90 tablet  4  . aspirin  81 MG tablet Take 81 mg by mouth daily.        . clonazePAM (KLONOPIN) 0.5 MG tablet Take 1 tablet (0.5 mg total) by mouth 2 (two) times daily as needed.  120 tablet  0  . Coenzyme Q10 (COQ10) 100 MG CAPS Take 1 capsule by mouth daily.        . DULoxetine (CYMBALTA) 60 MG capsule Take 60 mg by mouth daily.      . Glucosamine-Chondroit-Vit C-Mn (GLUCOSAMINE 1500 COMPLEX PO) 1-2 times per week      . mirtazapine (REMERON) 15 MG tablet Take 15 mg by mouth at bedtime.      . Multiple Vitamins-Minerals (MULTIVITAMIN PO) Take 1 tablet by mouth daily.        . Omega-3 Fatty Acids (FISH OIL) 1200 MG CAPS Take 1 capsule by mouth daily.        . simvastatin (ZOCOR) 40 MG tablet TAKE AS DIRECTED  90 tablet  3  . Vitamin D, Ergocalciferol, (DRISDOL) 50000 UNITS CAPS Take 1 capsule (50,000 Units total) by mouth every 7 (seven) days.  12 capsule  3  . [DISCONTINUED] venlafaxine (EFFEXOR) 75 MG tablet Take one tablet by mouth two times daily  120 tablet  0    No Known Allergies   Current Medications, Allergies, Past Medical History, Past Surgical History, Family History, and Social History were reviewed in [employer] record.    Review of Systems         The patient complains of joint pain and arthritis.  The patient denies fever, chills, sweats, anorexia, fatigue, weakness, malaise, weight loss, sleep disorder, blurring, diplopia, eye irritation, eye discharge, vision loss, eye pain, photophobia, earache, ear discharge, tinnitus, decreased hearing, nasal congestion,  nosebleeds, sore throat, hoarseness, chest pain, palpitations, syncope, dyspnea on exertion, orthopnea, PND, peripheral edema, cough, dyspnea at rest, excessive sputum, hemoptysis, wheezing, pleurisy, nausea, vomiting, diarrhea, constipation, change in bowel habits, abdominal pain, melena, hematochezia, jaundice, gas/bloating, indigestion/heartburn, dysphagia, odynophagia, dysuria, hematuria, urinary frequency, urinary hesitancy, nocturia, incontinence, back pain, joint swelling, muscle cramps, muscle weakness, stiffness, sciatica, restless legs, leg pain at night, leg pain with exertion, rash, itching, dryness, suspicious lesions, paralysis, paresthesias, seizures, tremors, vertigo, transient blindness, frequent falls, frequent headaches, difficulty walking, depression, anxiety, memory loss, confusion, cold intolerance, heat intolerance, polydipsia, polyphagia, polyuria, unusual weight change, abnormal bruising, bleeding, enlarged lymph nodes, urticaria, allergic rash, hay fever, and recurrent infections.     Objective:   Physical Exam     WD, WN, 72 y/o WM in NAD... GENERAL:  Alert & oriented; pleasant & cooperative... HEENT:  [place]/AT, EOM-wnl, PERRLA, Fundi-benign, EACs-clear, TMs-wnl, NOSE-clear, THROAT-clear & wnl. NECK:  Supple w/ fairROM; no JVD; normal carotid impulses w/o bruits; no thyromegaly or nodules palpated; no lymphadenopathy. CHEST:  Clear to P & A; without wheezes/ rales/ or rhonchi. HEART:  Regular Rhythm; without murmurs/ rubs/ or gallops. ABDOMEN:  Soft & nontender; normal bowel sounds; no organomegaly or masses detected. RECTAL:  Neg - prostate 3+ & nontender w/o nodules; stool hematest neg. EXT: without deformities, mild arthritic changes; no varicose veins/ venous insuffic/ or edema. NEURO:  CN's intact; motor testing normal; sensory testing normal; gait normal & balance OK. DERM:  No lesions noted; no rash etc...  RADIOLOGY DATA:  Reviewed in the EPIC EMR & discussed w/ the  patient...  LABORATORY DATA:  Reviewed in the EPIC EMR & discussed w/ the patient...   Assessment:      HBP>  Controlled on  the Amlodipine 5mg /d; continue same, no salt, etc...  Hx PAF>  Maintaining NSR w/o CP, palpit, etc...  CHOL>  Stable on Simva20 w/ FLP at goal...  ORTHO> DJD, Gout>  Stable w/ OTC analgesics, no clinical gout attacks, we reviewed exercise recommendations...  Vit D Defic>  On Vit D 50K weekly...  Hx psychiatric illness>  Followed by psychiatrist [name] in [place], Evalee Jefferson on EffexorXR 75mg - 2 daily & Klonopin 0.5mg  bid...     Plan:     Patient's Medications  New Prescriptions   No medications on file  Previous Medications   AMLODIPINE (NORVASC) 5 MG TABLET    Take 1 tablet (5 mg total) by mouth daily.   ASPIRIN 81 MG TABLET    Take 81 mg by mouth daily.     CLONAZEPAM (KLONOPIN) 0.5 MG TABLET    Take 1 tablet (0.5 mg total) by mouth 2 (two) times daily as needed.   COENZYME Q10 (COQ10) 100 MG CAPS    Take 1 capsule by mouth daily.     DULOXETINE (CYMBALTA) 60 MG CAPSULE    Take 60 mg by mouth daily.   GLUCOSAMINE-CHONDROIT-VIT C-MN (GLUCOSAMINE 1500 COMPLEX PO)    1-2 times per week   MIRTAZAPINE (REMERON) 15 MG TABLET    Take 15 mg by mouth at bedtime.   MULTIPLE VITAMINS-MINERALS (MULTIVITAMIN PO)    Take 1 tablet by mouth daily.     OMEGA-3 FATTY ACIDS (FISH OIL) 1200 MG CAPS    Take 1 capsule by mouth daily.     SIMVASTATIN (ZOCOR) 40 MG TABLET    TAKE AS DIRECTED   VITAMIN D, ERGOCALCIFEROL, (DRISDOL) 50000 UNITS CAPS    Take 1 capsule (50,000 Units total) by mouth every 7 (seven) days.  Modified Medications   No medications on file  Discontinued Medications   VENLAFAXINE (EFFEXOR) 75 MG TABLET    Take one tablet by mouth two times daily

## 2013-01-25 DIAGNOSIS — F332 Major depressive disorder, recurrent severe without psychotic features: Secondary | ICD-10-CM | POA: Diagnosis not present

## 2013-02-28 DIAGNOSIS — F332 Major depressive disorder, recurrent severe without psychotic features: Secondary | ICD-10-CM | POA: Diagnosis not present

## 2013-04-02 DIAGNOSIS — F332 Major depressive disorder, recurrent severe without psychotic features: Secondary | ICD-10-CM | POA: Diagnosis not present

## 2013-05-21 DIAGNOSIS — F332 Major depressive disorder, recurrent severe without psychotic features: Secondary | ICD-10-CM | POA: Diagnosis not present

## 2013-06-20 DIAGNOSIS — F332 Major depressive disorder, recurrent severe without psychotic features: Secondary | ICD-10-CM | POA: Diagnosis not present

## 2013-07-03 DIAGNOSIS — B079 Viral wart, unspecified: Secondary | ICD-10-CM | POA: Diagnosis not present

## 2013-07-03 DIAGNOSIS — L570 Actinic keratosis: Secondary | ICD-10-CM | POA: Diagnosis not present

## 2013-07-03 DIAGNOSIS — L821 Other seborrheic keratosis: Secondary | ICD-10-CM | POA: Diagnosis not present

## 2013-07-03 DIAGNOSIS — D235 Other benign neoplasm of skin of trunk: Secondary | ICD-10-CM | POA: Diagnosis not present

## 2013-07-03 DIAGNOSIS — D1801 Hemangioma of skin and subcutaneous tissue: Secondary | ICD-10-CM | POA: Diagnosis not present

## 2013-07-09 ENCOUNTER — Encounter: Payer: Self-pay | Admitting: Pulmonary Disease

## 2013-07-09 ENCOUNTER — Ambulatory Visit (INDEPENDENT_AMBULATORY_CARE_PROVIDER_SITE_OTHER): Payer: Medicare Other | Admitting: Pulmonary Disease

## 2013-07-09 VITALS — BP 126/78 | HR 75 | Temp 96.8°F | Ht 74.0 in | Wt 224.0 lb

## 2013-07-09 DIAGNOSIS — E7800 Pure hypercholesterolemia, unspecified: Secondary | ICD-10-CM | POA: Diagnosis not present

## 2013-07-09 DIAGNOSIS — Z8679 Personal history of other diseases of the circulatory system: Secondary | ICD-10-CM

## 2013-07-09 DIAGNOSIS — M1990 Unspecified osteoarthritis, unspecified site: Secondary | ICD-10-CM

## 2013-07-09 DIAGNOSIS — E559 Vitamin D deficiency, unspecified: Secondary | ICD-10-CM

## 2013-07-09 DIAGNOSIS — I10 Essential (primary) hypertension: Secondary | ICD-10-CM | POA: Diagnosis not present

## 2013-07-09 DIAGNOSIS — F489 Nonpsychotic mental disorder, unspecified: Secondary | ICD-10-CM

## 2013-07-09 NOTE — Progress Notes (Signed)
Subjective:     Patient ID: Jason [name], male   DOB: Jun 11, 1941, 72 y.o.   MRN: [id number]  HPI 72 y/o WM here for a follow up appt... he is the son-in-law of Jason [name]...   ~  March 03, 2011:  53mo ROV & review of medical problems including remote hx PAF; Hyperchol; Overweight; DJD & ?hx of gout; hx of Psyche disorder...     ?Hx of PAF: Remote hx PAF (details unknown), holding NSR w/o ectopy since we have known him starting in 2007... States he has occas palpit, skips, nothing sustained, & noted esp when stressed, denies symptoms w/ exercise, no CP/ SOB/ edema/ etc... Not currently on meds and we discussed avoiding caffeine, pseudophed, etc> further eval if symptoms worsen.     Hyperchol: Prev labs looked satis on Simva40 but he cut back on his own to 20mg /d "due to everything that I heard on TV"; FLP on the 20mg /d dose looks ok!    DJD: He uses OTC Ibuprofen Prn; no specific arthritis complaints at this time.     Psyche: Hx "physical breakdown" age 79, followed by [place] in [place] on EffexorXR 75mg /d, stable & doing well by his hx.   ~  January 03, 2012:  58mo ROV & he notes under incr stress due to death of 2 friends this past yr, sister dx w/ alz, & nephew's child has autism; he is torn betw retiring or not;  Due to all this his BP has been elevated> seen in ER & started on Norvasc5mg ;  He saw TP & Norvasc helping, she rec f/u w/ Psychiatrist in [place] & DrAshby increased his EffexorXR75 to 2/d & added Klonopin 0.5mg  Bid==> improved;  [name] requested that we check his VitD level & he will return for Fasting blood work at his convenience... LABS 1/13:  FLP- at goals on Simva20;  Chems- wnl;  CBC- wnl;  TSH=1.40;  PSA=0.53;  VitD=15...   ~  July 03, 2012:  55mo ROV & Jason [name] is improved, feeling well w/o new complaints or concerns... BP controlled on meds; denies CP, palpit, dizzy, SOB, etc; Chol looks good on Simva20;  See prob list below for updates >>  ~  January 07, 2013:  55mo ROV & Jason [name]  has had a good interval, one bout of prostatitis treated by DrOttelin, otherw w/o new complaints or concerns;  We reviewed the following medical problems during today's office visit >>      HBP> on Amlod5; BP= 120/84 & similar at home; he denies CP, palpit, dizzy, SOB, edema, etc...    PAF> on ASA81; he is maintaining NSR- EKG w/ occas PACs; denies CP, palpit, etc...    Chol> on Simva40, FishOil, CoQ10; FLP shows TChol 197, TG 82, HDL 58, LDL 123; rec better diet, get wt down, same med...    GU- he had an epis of prostatitis treated by drOttelin w/ Cipro & resolved...    DJD> on Glucosamine & OTC analgesics prn...    Vit D defic> on VitD 50K weekly; Labs show VitD level = 32, rec to continue 50K weekly...    Hx Psychiatic disorder> on Klonopin0.5Bid, Cymbalta60, Remeron15-1/2 from DrCottle... We reviewed prob list, meds, xrays and labs> see below for updates >> he had the 2013 Flu vaccine 10/13 CXR 1/14 showed borderline heart size, clear lungs, NAD.Marland KitchenMarland Kitchen EKG 1/14 showed NSR, rate91, rare PAC, NSSTTWA, NAD... LABS 1/14:  FLP- ok x LDL=123 on Simva20;  Chems- wnl;  CBC- wnl;  TSH=1.38;  VitD=32;  Uric=7.8.Marland KitchenMarland Kitchen  ~  July 09, 2013:  47mo ROV & Jason [name] notes that he retired this past yr- doing satis w/o new complaints or concerns... We reviewed the following medical problems during today's office visit >>     HBP> on Amlod5; BP= 126/78 & similar at home; he denies CP, palpit, dizzy, SOB, edema, etc...    PAF> on ASA81; he is maintaining NSR- EKG w/ occas PACs; denies CP, palpit, etc...    Chol> on Simva40 (but only takes irregularly), FishOil, CoQ10; FLP 1/14 showed TChol 197, TG 82, HDL 58, LDL 123; rec take med daily, better diet, get wt down...    GU- he had an epis of prostatitis treated by DrOttelin w/ Cipro & resolved...    DJD> on Glucosamine & OTC analgesics prn...    Vit D defic> on VitD 50K weekly; Labs 1/14 showed VitD level = 32, rec to continue 50K weekly...    Hx Psychiatic disorder> on  Klonopin0.5Bid, Cymbalta60, Remeron15-1/2, and now Abilify2 from DrCottle... We reviewed prob list, meds, xrays and labs> see below for updates >>            Problem List:  Hx of PNEUMONIA (ICD-486) - he had "walking pneumonia" in 2006 treated by [facility];  He is a never smoker... ~  CXR 1/14 showed borderline heart size, clear lungs, NAD...  HYPERTENSION >> mild HBP assoc w/ stress & went to ER in 2012 w/ NORVASC 5mg /d started; f/u BP checks here ok on the Norvasc- continue same. ~  1/13:  BP= 110/88 & feeling better> denies CP, palpit, dizzy, SOB, edema, or cerebral ischemic symptoms... ~  7/13:  BP= 108/78 & he remains asymptomatic ~  1/14: on Amlod5; BP= 120/84 & similar at home; he denies CP, palpit, dizzy, SOB, edema, etc. ~  7/15: on Amlod5; BP= 126/78 & similar at home; he denies CP, palpit, dizzy, SOB, edema, etc.  Hx of PAROXYSMAL ATRIAL FIBRILLATION (ICD-427.31) - on ASA 81mg /d...remote hx of PAF- details unknown... he has been in NSR without ectopy since we have known him starting in Jul07... denies CP, palpit, dizzy, syncope, SOB, edema, etc...  He knows to avoid caffeine, pseudoephedrine etc... ~  EKG 1/14 showed NSR, rate91, rare PAC, NSSTTWA, NAD.Marland Kitchen.  ~  7/14: on ASA81; he is maintaining NSR- EKG w/ occas PACs; denies CP, palpit, etc.  HYPERCHOLESTEROLEMIA (ICD-272.0) - on SIMVASTATIN 40mg - taking 1/2 tab daily + FishOil etc... ~  FLP 7/07 on diet alone showed TChol 254, TG 70, HDL 48, LDL 209... needs Rx- start Simvastatin40. ~  FLP 9/07 on Zocor40 showed TChol 150, TG 46, HDL 46, LDL 94... great! continue same... ~  FLP 7/09 on Simva40 showed TChol 168, TG 79, HDL 48, LDL 105 ~  FLP 11/10 on Simva40 showed TChol 152, TG 63, HDL 55, LDL 84... He decr Simva to 20mg /d on his own. ~  FLP 3/12 on Simva20 showed TChol 165, TG 59, HDL 50, LDL 103 ~  FLP 3/13 on Simva20 showed TChol 144, TG 61, HDL 49, LDL 83... Continue same. ~  FLP 1/14 on Simva20 showed TChol 197, TG 82, HDL 58,  LDL 123  DEGENERATIVE JOINT DISEASE, MILD (ICD-715.90) GOUT (ICD-274.9) - ? hx of gout in the past... treated [facility] w/ "antibiotic" per pt... he uses OTC Rx Prn (Ibuprofen, cherry extract)... ~  labs 11/10 showed Uric= 7.5 (4-7.8)... ~  Labs 1/14 showed Uric= 7.8  VITAMIN D DEFICIENCY >> on VIT D 50K weekly... ~  Labs 1/13 showed Vit D level = 15... rec to start on Vit D 50K weekly... ~  Labs 1/14 showed Vit D level = 32... Continue 50K weekly...  Hx of PSYCHIATRIC DISORDER (ICD-300.9) - hx of "physical breakdown" age 25... Prev followed by [name], Psychiatrist in [place] on [place]; now followed by DrCottle on Cymbalta, Remeron, Klonopin... ~  2012:  Pt noted increased stress & DrAshby icreased his EFFEXOR XR to 150mg /d & added KLONOPIN 0.5mg  Bid... ~  1/14: on Klonopin0.5Bid, Cymbalta60, Remeron15-1/2 from DrCottle... ~  7/14:  on Klonopin0.5Bid, Cymbalta60, Remeron15-1/2, and now Abilify2 from DrCottle.  Hx SHINGLES >> he reports episode of shingles involving the right side of his face in ~1998... ~  7/13:  We discussed shingles vaccine but he has decided to hold off for now...  Health Maintenance:  ~ GI: Followed by DrJacobs, neg colon 2007- no divertics, no polyps, f/u due 2017.  ~ GU: Asymptomatic, norm DRE, norm PSAs, atrophic testes on exam- feels well, good energy, etc.  ~ Immunizations: He gets the yearly flu vaccine; Had PNEUMOVAX & TETANUS in 2007 (age 74).   History reviewed. No pertinent past surgical history.   Outpatient Encounter Prescriptions as of 07/09/2013  Medication Sig Dispense Refill  . amLODipine (NORVASC) 5 MG tablet Take 1 tablet (5 mg total) by mouth daily.  90 tablet  4  . aspirin 81 MG tablet Take 81 mg by mouth daily.        . clonazePAM (KLONOPIN) 0.5 MG tablet Take 1 tablet (0.5 mg total) by mouth 2 (two) times daily as needed.  120 tablet  0  . Coenzyme Q10 (COQ10) 100 MG CAPS Take 1 capsule by mouth daily.        . DULoxetine (CYMBALTA) 60 MG  capsule Take 60 mg by mouth daily.      . Glucosamine-Chondroit-Vit C-Mn (GLUCOSAMINE 1500 COMPLEX PO) 1-2 times per week      . mirtazapine (REMERON) 15 MG tablet Take 15 mg by mouth at bedtime.      . Multiple Vitamins-Minerals (MULTIVITAMIN PO) Take 1 tablet by mouth daily.        . Omega-3 Fatty Acids (FISH OIL) 1200 MG CAPS Take 1 capsule by mouth daily.        . simvastatin (ZOCOR) 40 MG tablet TAKE AS DIRECTED  90 tablet  3  . Vitamin D, Ergocalciferol, (DRISDOL) 50000 UNITS CAPS Take 1 capsule (50,000 Units total) by mouth every 7 (seven) days.  12 capsule  3   No facility-administered encounter medications on file as of 07/09/2013.    No Known Allergies   Current Medications, Allergies, Past Medical History, Past Surgical History, Family History, and Social History were reviewed in [employer] record.    Review of Systems         The patient complains of joint pain and arthritis.  The patient denies fever, chills, sweats, anorexia, fatigue, weakness, malaise, weight loss, sleep disorder, blurring, diplopia, eye irritation, eye discharge, vision loss, eye pain, photophobia, earache, ear discharge, tinnitus, decreased hearing, nasal congestion, nosebleeds, sore throat, hoarseness, chest pain, palpitations, syncope, dyspnea on exertion, orthopnea, PND, peripheral edema, cough, dyspnea at rest, excessive sputum, hemoptysis, wheezing, pleurisy, nausea, vomiting, diarrhea, constipation, change in bowel habits, abdominal pain, melena, hematochezia, jaundice, gas/bloating, indigestion/heartburn, dysphagia, odynophagia, dysuria, hematuria, urinary frequency, urinary hesitancy, nocturia, incontinence, back pain, joint swelling, muscle cramps, muscle weakness, stiffness, sciatica, restless legs, leg pain at night, leg pain with exertion, rash, itching, dryness, suspicious  lesions, paralysis, paresthesias, seizures, tremors, vertigo, transient blindness, frequent falls, frequent  headaches, difficulty walking, depression, anxiety, memory loss, confusion, cold intolerance, heat intolerance, polydipsia, polyphagia, polyuria, unusual weight change, abnormal bruising, bleeding, enlarged lymph nodes, urticaria, allergic rash, hay fever, and recurrent infections.     Objective:   Physical Exam     WD, WN, 72 y/o WM in NAD... GENERAL:  Alert & oriented; pleasant & cooperative... HEENT:  /AT, EOM-wnl, PERRLA, Fundi-benign, EACs-clear, TMs-wnl, NOSE-clear, THROAT-clear & wnl. NECK:  Supple w/ fairROM; no JVD; normal carotid impulses w/o bruits; no thyromegaly or nodules palpated; no lymphadenopathy. CHEST:  Clear to P & A; without wheezes/ rales/ or rhonchi. HEART:  Regular Rhythm; without murmurs/ rubs/ or gallops. ABDOMEN:  Soft & nontender; normal bowel sounds; no organomegaly or masses detected. RECTAL:  Neg - prostate 3+ & nontender w/o nodules; stool hematest neg. EXT: without deformities, mild arthritic changes; no varicose veins/ venous insuffic/ or edema. NEURO:  CN's intact; motor testing normal; sensory testing normal; gait normal & balance OK. DERM:  No lesions noted; no rash etc...  RADIOLOGY DATA:  Reviewed in the EPIC EMR & discussed w/ the patient...  LABORATORY DATA:  Reviewed in the EPIC EMR & discussed w/ the patient...   Assessment:      HBP>  Controlled on the Amlodipine 5mg /d; continue same, no salt, etc...  Hx PAF>  Maintaining NSR w/o CP, palpit, etc...  CHOL>  Stable on Simva20 w/ FLP at goal...  ORTHO> DJD, Gout>  Stable w/ OTC analgesics, no clinical gout attacks, we reviewed exercise recommendations...  Vit D Defic>  On Vit D 50K weekly...  Hx psychiatric illness>  Prev followed by psychiatrist [name] in [place], [place]; now DrCottle in [place] on 4 meds as listed...     Plan:     Patient's Medications  New Prescriptions   No medications on file  Previous Medications   ARIPIPRAZOLE (ABILIFY) 2 MG TABLET    Take 2 mg by mouth  daily.   ASPIRIN 81 MG TABLET    Take 81 mg by mouth daily.     CLONAZEPAM (KLONOPIN) 0.5 MG TABLET    Take 1 tablet (0.5 mg total) by mouth 2 (two) times daily as needed.   COENZYME Q10 (COQ10) 100 MG CAPS    Take 1 capsule by mouth daily.     DULOXETINE (CYMBALTA) 60 MG CAPSULE    Take 60 mg by mouth daily.   GLUCOSAMINE-CHONDROIT-VIT C-MN (GLUCOSAMINE 1500 COMPLEX PO)    1-2 times per week   MIRTAZAPINE (REMERON) 30 MG TABLET    Take 30 mg by mouth at bedtime.   MULTIPLE VITAMINS-MINERALS (MULTIVITAMIN PO)    Take 1 tablet by mouth daily.     OMEGA-3 FATTY ACIDS (FISH OIL) 1200 MG CAPS    Take 1 capsule by mouth daily.     SIMVASTATIN (ZOCOR) 40 MG TABLET    TAKE AS DIRECTED   VITAMIN D, ERGOCALCIFEROL, (DRISDOL) 50000 UNITS CAPS    Take 1 capsule (50,000 Units total) by mouth every 7 (seven) days.  Modified Medications   Modified Medication Previous Medication   AMLODIPINE (NORVASC) 5 MG TABLET amLODipine (NORVASC) 5 MG tablet      TAKE 1 TABLET DAILY    Take 1 tablet (5 mg total) by mouth daily.  Discontinued Medications   MIRTAZAPINE (REMERON) 15 MG TABLET    Take 15 mg by mouth at bedtime.

## 2013-07-09 NOTE — Patient Instructions (Addendum)
Today we updated your med list in our EPIC system...    Continue your current medications the same...  Let's get on track w/ our exercise program as we discussed...  Call for any questions...  Let's plan a follow up visit in 44mo, sooner if needed for problems.Marland KitchenMarland Kitchen

## 2013-07-25 DIAGNOSIS — F332 Major depressive disorder, recurrent severe without psychotic features: Secondary | ICD-10-CM | POA: Diagnosis not present

## 2013-07-26 ENCOUNTER — Telehealth: Payer: Self-pay | Admitting: Pulmonary Disease

## 2013-07-26 NOTE — Telephone Encounter (Signed)
Received 3 page from [facility], sent to Dr. Kriste Basque. 07/26/13/ss

## 2013-07-31 ENCOUNTER — Other Ambulatory Visit: Payer: Self-pay

## 2013-08-15 DIAGNOSIS — R413 Other amnesia: Secondary | ICD-10-CM | POA: Diagnosis not present

## 2013-08-15 DIAGNOSIS — R5381 Other malaise: Secondary | ICD-10-CM | POA: Diagnosis not present

## 2013-08-15 DIAGNOSIS — E291 Testicular hypofunction: Secondary | ICD-10-CM | POA: Diagnosis not present

## 2013-08-29 DIAGNOSIS — F332 Major depressive disorder, recurrent severe without psychotic features: Secondary | ICD-10-CM | POA: Diagnosis not present

## 2013-09-03 DIAGNOSIS — F332 Major depressive disorder, recurrent severe without psychotic features: Secondary | ICD-10-CM | POA: Diagnosis not present

## 2013-09-06 ENCOUNTER — Other Ambulatory Visit: Payer: Self-pay | Admitting: Pulmonary Disease

## 2013-09-06 DIAGNOSIS — F332 Major depressive disorder, recurrent severe without psychotic features: Secondary | ICD-10-CM | POA: Diagnosis not present

## 2013-09-19 DIAGNOSIS — F332 Major depressive disorder, recurrent severe without psychotic features: Secondary | ICD-10-CM | POA: Diagnosis not present

## 2013-09-26 ENCOUNTER — Ambulatory Visit (INDEPENDENT_AMBULATORY_CARE_PROVIDER_SITE_OTHER): Payer: Medicare Other

## 2013-09-26 DIAGNOSIS — Z23 Encounter for immunization: Secondary | ICD-10-CM | POA: Diagnosis not present

## 2013-10-21 DIAGNOSIS — F332 Major depressive disorder, recurrent severe without psychotic features: Secondary | ICD-10-CM | POA: Diagnosis not present

## 2013-10-23 ENCOUNTER — Other Ambulatory Visit: Payer: Self-pay | Admitting: Pulmonary Disease

## 2013-10-25 ENCOUNTER — Other Ambulatory Visit: Payer: Self-pay | Admitting: Pulmonary Disease

## 2013-10-28 ENCOUNTER — Telehealth: Payer: Self-pay | Admitting: Pulmonary Disease

## 2013-10-28 DIAGNOSIS — R110 Nausea: Secondary | ICD-10-CM

## 2013-10-28 DIAGNOSIS — R109 Unspecified abdominal pain: Secondary | ICD-10-CM

## 2013-10-28 NOTE — Telephone Encounter (Signed)
Last OV 07-09-13. I spoke with the pt and he is c/o having nausea and stomach ache/soreness for several months now. He states he has tried prilosec and this helped some but not a lot. He has also been drinking a tea mixed with honey and lemon extract without relief. He states he gets so nauseous that the feels like he needs to vomit but never does. He consulted with his phychiatrist to make sure none of those meds or his depression was causing these symptoms and he was advised no and to call SN.  Pt states he has never seen a GI doctor. Please advise. Carron Curie, CMA No Known Allergies

## 2013-10-28 NOTE — Telephone Encounter (Signed)
Per SN---  This will need further eval.  Prev seen by Dr. Christella Hartigan with negative colon in 2007.  Will need ov with either GI (MD or PA, NP) , Dr. Christella Hartigan or SN.   Order has been placed but GI is gone for the day.  Jason [name] will work on an appt with GI first thing in the morning.  Called and spoke with pts wife and she is aware that we will call her in the morning to let her know about an appt with either GI or SN.

## 2013-10-29 ENCOUNTER — Ambulatory Visit (INDEPENDENT_AMBULATORY_CARE_PROVIDER_SITE_OTHER): Payer: Medicare Other | Admitting: Gastroenterology

## 2013-10-29 ENCOUNTER — Encounter: Payer: Self-pay | Admitting: Gastroenterology

## 2013-10-29 VITALS — BP 102/68 | HR 99 | Ht 74.75 in | Wt 222.0 lb

## 2013-10-29 DIAGNOSIS — R110 Nausea: Secondary | ICD-10-CM | POA: Diagnosis not present

## 2013-10-29 MED ORDER — ONDANSETRON HCL 4 MG PO TABS: 4.0000 mg | ORAL_TABLET | Freq: Three times a day (TID) | ORAL | Status: DC | PRN

## 2013-10-29 NOTE — Progress Notes (Signed)
HPI: This is a    very pleasant 72 year old man who is here with his wife today.  Has been bothered by nausea for about 12 months around the time of new depression and a new psychiatrist.   His effexor was increased. This was stopped due to nausea and cymbalta was started and that also made him very nauseas.   effexor #1 side effect is nausea.  Same with cymbalta.   Currently on pristique, nausea is also #1 side effect of this medicine  abilify #5 side effect is nausea  Treated for depression with a variety of medicine.  Currently on 3 antidepressants.  No losing weight.   No vomiting.  No abdominal pains.  Can be a bit sore in abd   Review of systems: Pertinent positive and negative review of systems were noted in the above HPI section. Complete review of systems was performed and was otherwise normal.    Past Medical History  Diagnosis Date  . Routine general medical examination at a health care facility   . Pneumonia, organism unspecified   . Atrial fibrillation   . Pure hypercholesterolemia   . Osteoarthrosis, unspecified whether generalized or localized, unspecified site   . Gout, unspecified   . Unspecified nonpsychotic mental disorder   . Colon polyp   . Anxiety   . Depression   . Hypertension     Past Surgical History  Procedure Laterality Date  . Neg hx      Current Outpatient Prescriptions  Medication Sig Dispense Refill  . amLODipine (NORVASC) 5 MG tablet TAKE 1 TABLET DAILY  90 tablet  3  . ARIPiprazole (ABILIFY) 2 MG tablet Take 2 mg by mouth daily.      Marland Kitchen aspirin 81 MG tablet Take 81 mg by mouth daily.        . clonazePAM (KLONOPIN) 0.5 MG tablet Take 1 tablet (0.5 mg total) by mouth 2 (two) times daily as needed.  120 tablet  0  . desvenlafaxine (PRISTIQ) 100 MG 24 hr tablet Take 100 mg by mouth daily.      Marland Kitchen gabapentin (NEURONTIN) 300 MG capsule Take 300 mg by mouth. 2-3 tablets daily      . mirtazapine (REMERON) 30 MG tablet Take 30 mg by  mouth at bedtime.      . simvastatin (ZOCOR) 40 MG tablet TAKE AS DIRECTED  90 tablet  3  . Vitamin D, Ergocalciferol, (DRISDOL) 50000 UNITS CAPS capsule TAKE 1 CAPSULE EVERY 7 DAYS  12 capsule  2   No current facility-administered medications for this visit.    Allergies as of 10/29/2013  . (No Known Allergies)    Family History  Problem Relation Age of Onset  . Dementia Mother   . Pneumonia Father   . Heart failure Father   . Arthritis Other   . Colon cancer Neg Hx     History   Social History  . Marital Status: Married    Spouse Name: [name]    Number of Children: 0  . Years of Education: N/A   Occupational History  . retired    Social History Main Topics  . Smoking status: Never Smoker   . Smokeless tobacco: Never Used  . Alcohol Use: No  . Drug Use: No  . Sexual Activity: Not on file   Other Topics Concern  . Not on file   Social History Narrative  . No narrative on file       Physical Exam: BP 102/68  Pulse  99  Ht 6' 2.75" (1.899 m)  Wt 222 lb (100.699 kg)  BMI 27.92 kg/m2 Constitutional: generally well-appearing Psychiatric: alert and oriented x3 Eyes: extraocular movements intact Mouth: oral pharynx moist, no lesions Neck: supple no lymphadenopathy Cardiovascular: heart regular rate and rhythm Lungs: clear to auscultation bilaterally Abdomen: soft, nontender, nondistended, no obvious ascites, no peritoneal signs, normal bowel sounds Extremities: no lower extremity edema bilaterally Skin: no lesions on visible extremities    Assessment and plan: 72 y.o. male with  nausea rather chronically, related to his antidepressant medicines.  He is very clear that several of his antidepressant medicines make him nauseous and that when their doses are turned up he is even more nauseous. My drug index database shows #1 side effect of these antidepressant medicines is indeed nausea. He has been taking omeprazole for about a week with incomplete relief of  his nausea. He has never been offered antinausea medicines. I'm giving him a prescription for Zofran 4 mg pills take 1 pill every 8 hours as needed for nausea. I gave him several refills and I am happy to refill it more in the future if needed. I would like to proceed with EGD to rule out other, structural causes such as ulcer diseases, gastritis.

## 2013-10-29 NOTE — Patient Instructions (Addendum)
You should stay on the omeprazole, one pill once daily. New prescription was called in zofran 4mg  pills, take one pill 2-3 times per day for nausea. You will be set up for an upper endoscopy (LEC, MAC) for nausea. Your nausea is most likely due to your depression medicines (#1 side effect is nausea).

## 2013-10-29 NOTE — Telephone Encounter (Signed)
Jason [name] called this morning and pt has appt today with Dr. Christella Hartigan in GI and pt is aware

## 2013-11-19 DIAGNOSIS — F332 Major depressive disorder, recurrent severe without psychotic features: Secondary | ICD-10-CM | POA: Diagnosis not present

## 2013-11-27 DIAGNOSIS — M19019 Primary osteoarthritis, unspecified shoulder: Secondary | ICD-10-CM | POA: Diagnosis not present

## 2013-12-06 ENCOUNTER — Encounter: Payer: Medicare Other | Admitting: Gastroenterology

## 2013-12-13 ENCOUNTER — Encounter: Payer: Medicare Other | Admitting: Gastroenterology

## 2013-12-24 IMAGING — CR DG CHEST 2V
3 series · 3 of 3 positions shown · non-contrast
Comparison: 03/03/2011

CLINICAL DATA: Hypertension

CHEST - 2 VIEW

[view not recorded (1 of 3)]
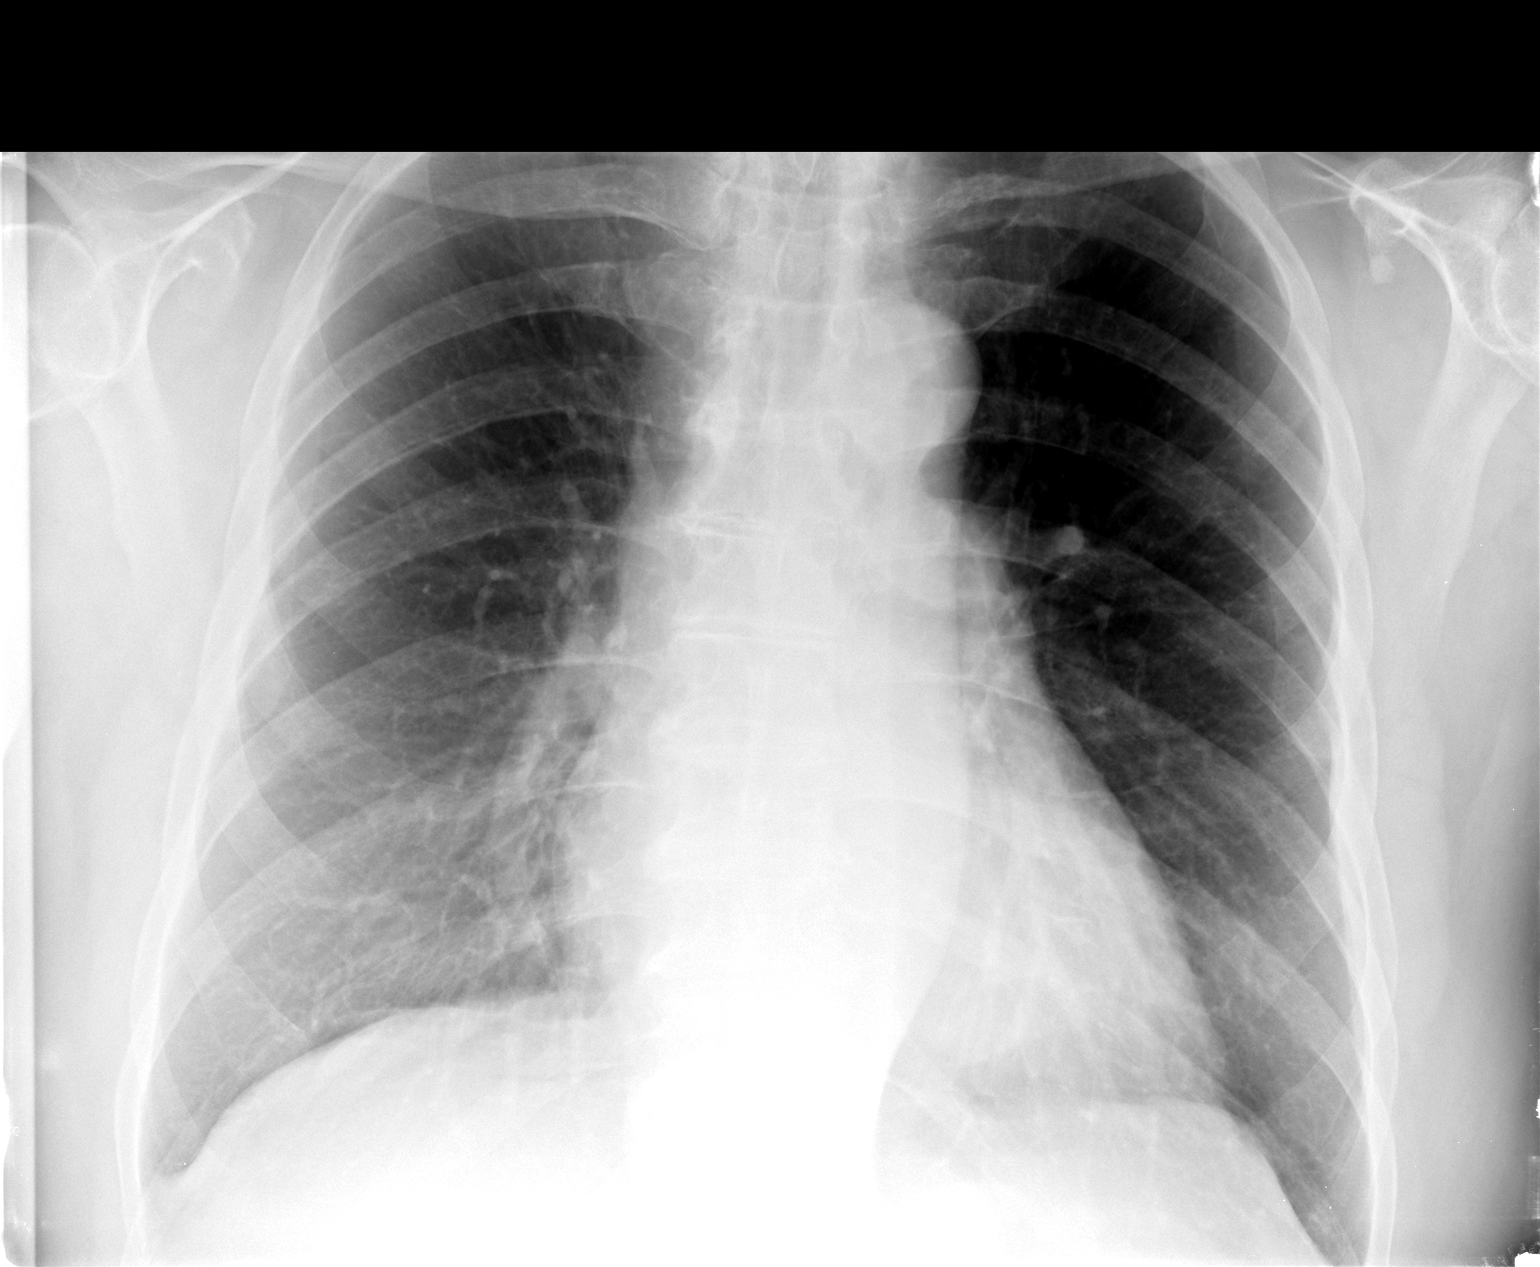

[view not recorded (2 of 3)]
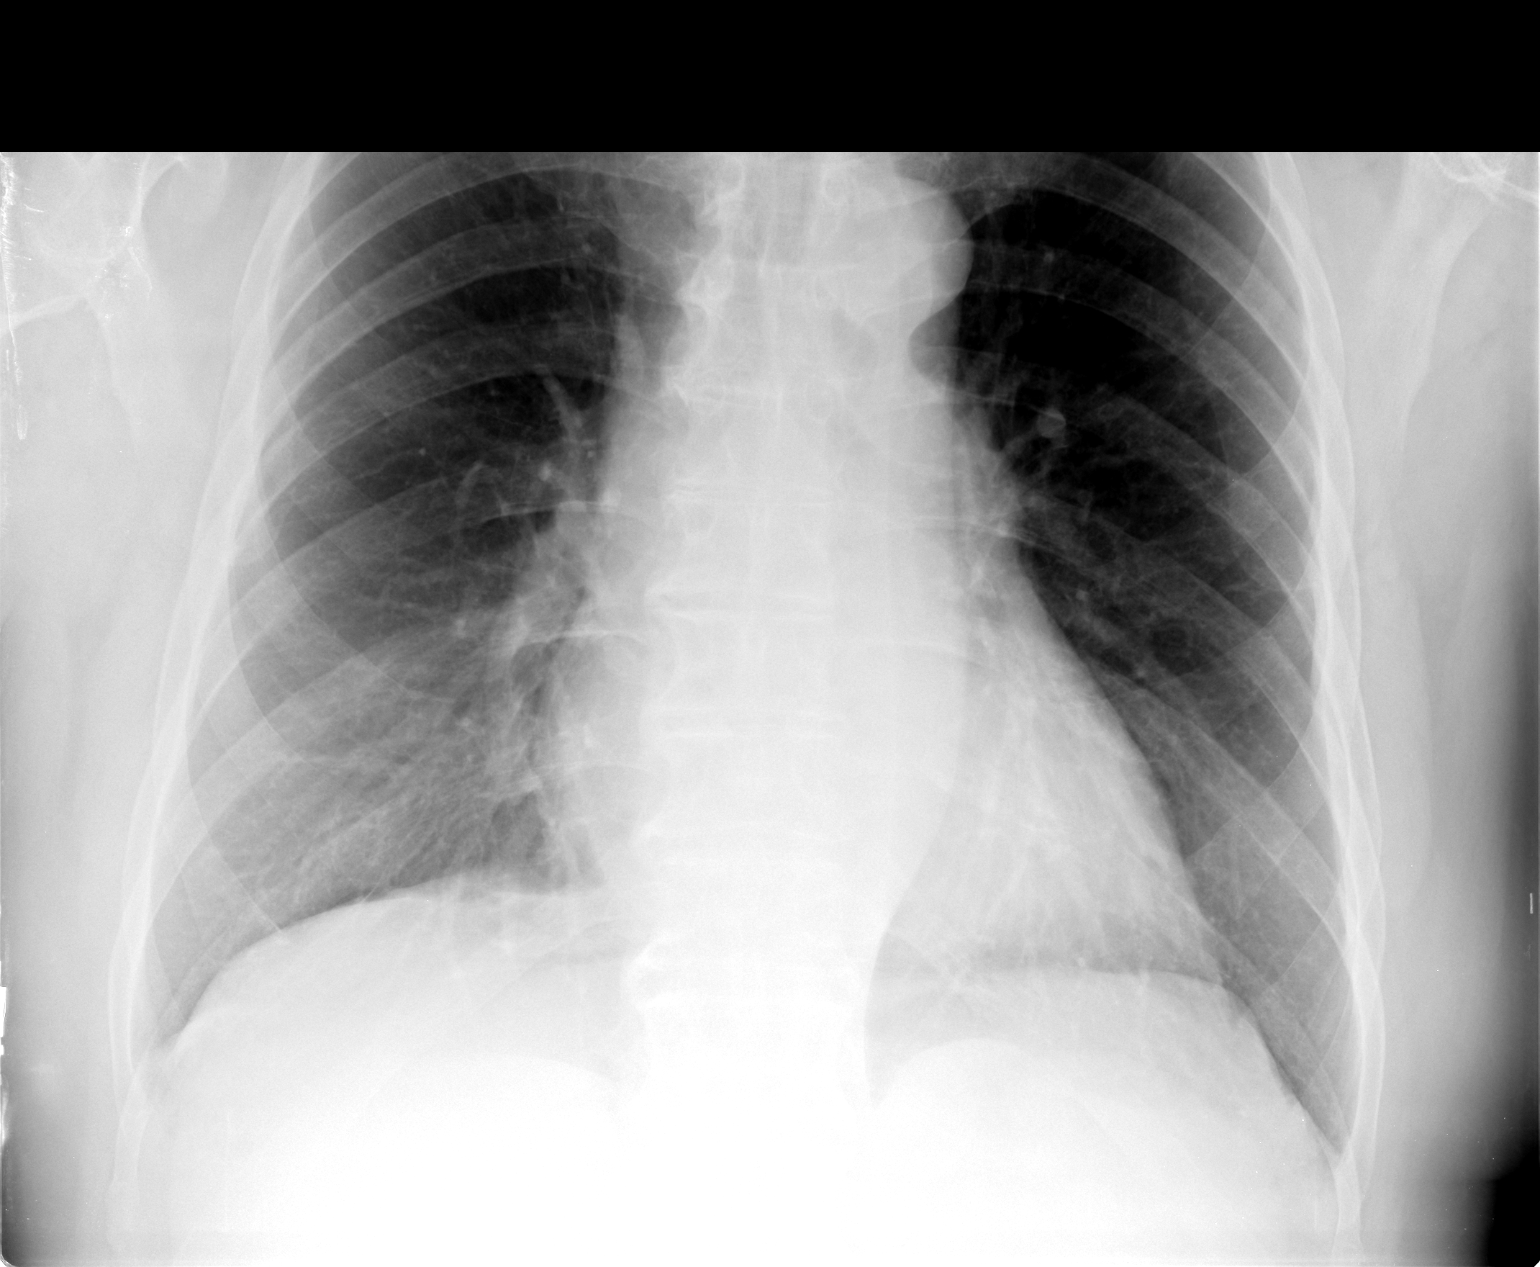

[view not recorded (3 of 3)]
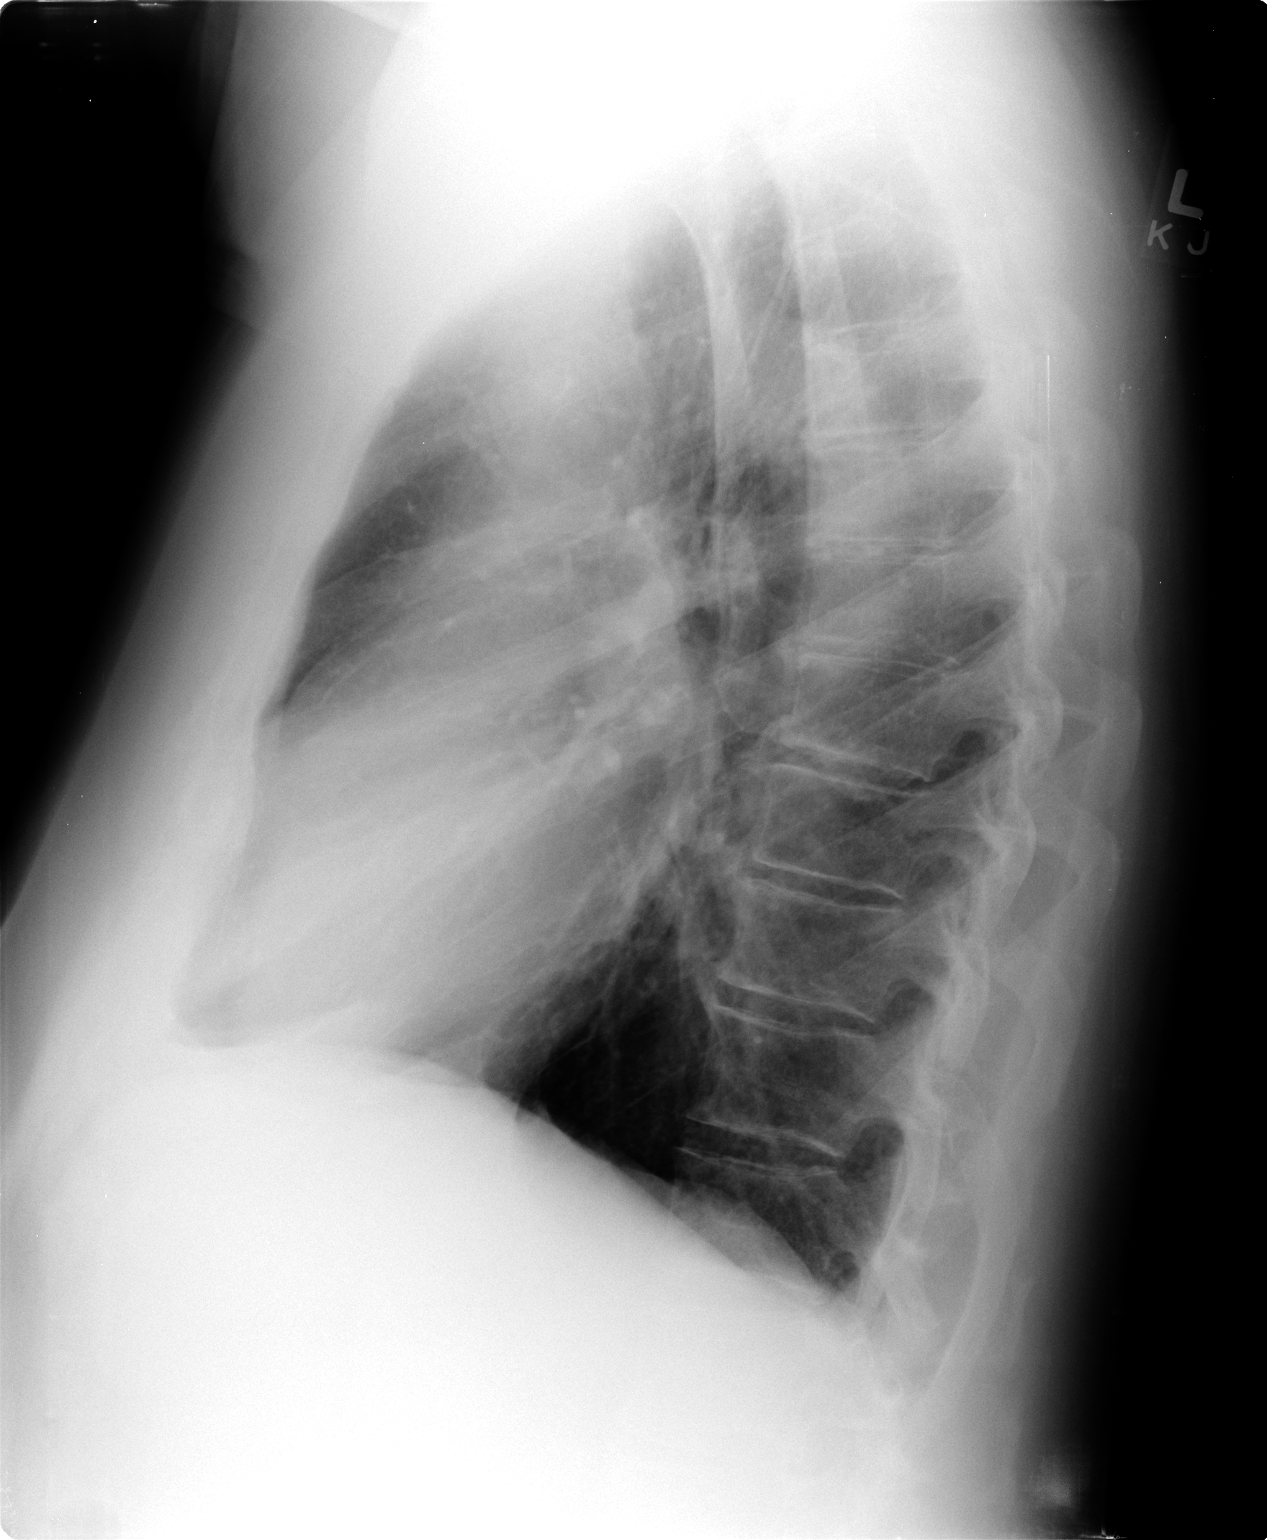

[3 of 3 positions shown; findings below may reference images not displayed]

FINDINGS: Mild cardiac enlargement without heart failure.  Lungs
are clear without infiltrate or effusion.  Negative for mass or
adenopathy.
IMPRESSION: Mild cardiac enlargement without acute abnormality.

## 2013-12-26 HISTORY — PX: UPPER GASTROINTESTINAL ENDOSCOPY: SHX188

## 2013-12-27 ENCOUNTER — Encounter: Payer: Medicare Other | Admitting: Gastroenterology

## 2013-12-27 DIAGNOSIS — F332 Major depressive disorder, recurrent severe without psychotic features: Secondary | ICD-10-CM | POA: Diagnosis not present

## 2014-01-02 DIAGNOSIS — M1610 Unilateral primary osteoarthritis, unspecified hip: Secondary | ICD-10-CM | POA: Diagnosis not present

## 2014-01-02 DIAGNOSIS — M169 Osteoarthritis of hip, unspecified: Secondary | ICD-10-CM | POA: Diagnosis not present

## 2014-01-13 ENCOUNTER — Encounter: Payer: Self-pay | Admitting: Pulmonary Disease

## 2014-01-13 ENCOUNTER — Other Ambulatory Visit (INDEPENDENT_AMBULATORY_CARE_PROVIDER_SITE_OTHER): Payer: Medicare Other

## 2014-01-13 ENCOUNTER — Ambulatory Visit (INDEPENDENT_AMBULATORY_CARE_PROVIDER_SITE_OTHER): Payer: Medicare Other | Admitting: Pulmonary Disease

## 2014-01-13 VITALS — BP 116/84 | HR 94 | Temp 96.5°F | Ht 74.0 in | Wt 222.8 lb

## 2014-01-13 DIAGNOSIS — M1990 Unspecified osteoarthritis, unspecified site: Secondary | ICD-10-CM | POA: Diagnosis not present

## 2014-01-13 DIAGNOSIS — F411 Generalized anxiety disorder: Secondary | ICD-10-CM

## 2014-01-13 DIAGNOSIS — Z8679 Personal history of other diseases of the circulatory system: Secondary | ICD-10-CM

## 2014-01-13 DIAGNOSIS — F489 Nonpsychotic mental disorder, unspecified: Secondary | ICD-10-CM

## 2014-01-13 DIAGNOSIS — I10 Essential (primary) hypertension: Secondary | ICD-10-CM | POA: Diagnosis not present

## 2014-01-13 DIAGNOSIS — E7800 Pure hypercholesterolemia, unspecified: Secondary | ICD-10-CM

## 2014-01-13 DIAGNOSIS — E559 Vitamin D deficiency, unspecified: Secondary | ICD-10-CM | POA: Diagnosis not present

## 2014-01-13 DIAGNOSIS — F419 Anxiety disorder, unspecified: Secondary | ICD-10-CM

## 2014-01-13 LAB — CBC WITH DIFFERENTIAL/PLATELET
BASOS ABS: 0 10*3/uL (ref 0.0–0.1)
BASOS PCT: 0.3 % (ref 0.0–3.0)
EOS ABS: 0.3 10*3/uL (ref 0.0–0.7)
Eosinophils Relative: 4 % (ref 0.0–5.0)
HEMATOCRIT: 43.2 % (ref 39.0–52.0)
HEMOGLOBIN: 14.5 g/dL (ref 13.0–17.0)
LYMPHS ABS: 1.8 10*3/uL (ref 0.7–4.0)
LYMPHS PCT: 23.9 % (ref 12.0–46.0)
MCHC: 33.5 g/dL (ref 30.0–36.0)
MCV: 90.7 fl (ref 78.0–100.0)
Monocytes Absolute: 0.4 10*3/uL (ref 0.1–1.0)
Monocytes Relative: 5.4 % (ref 3.0–12.0)
NEUTROS ABS: 4.9 10*3/uL (ref 1.4–7.7)
Neutrophils Relative %: 66.4 % (ref 43.0–77.0)
Platelets: 237 10*3/uL (ref 150.0–400.0)
RBC: 4.76 Mil/uL (ref 4.22–5.81)
RDW: 13.3 % (ref 11.5–14.6)
WBC: 7.4 10*3/uL (ref 4.5–10.5)

## 2014-01-13 LAB — BASIC METABOLIC PANEL
BUN: 11 mg/dL (ref 6–23)
CALCIUM: 9.5 mg/dL (ref 8.4–10.5)
CO2: 29 mEq/L (ref 19–32)
CREATININE: 0.8 mg/dL (ref 0.4–1.5)
Chloride: 104 mEq/L (ref 96–112)
GFR: 98.6 mL/min (ref >60.00)
Glucose, Bld: 92 mg/dL (ref 70–99)
Potassium: 4.2 mEq/L (ref 3.5–5.1)
SODIUM: 140 meq/L (ref 135–145)

## 2014-01-13 LAB — LIPID PANEL
CHOL/HDL RATIO: 3
Cholesterol: 150 mg/dL (ref 0–200)
HDL: 59.9 mg/dL (ref >39.00)
LDL CALC: 78 mg/dL (ref 0–99)
TRIGLYCERIDES: 61 mg/dL (ref 0.0–149.0)
VLDL: 12.2 mg/dL (ref 0.0–40.0)

## 2014-01-13 LAB — HEPATIC FUNCTION PANEL
ALK PHOS: 65 U/L (ref 39–117)
ALT: 27 U/L (ref 0–53)
AST: 23 U/L (ref 0–37)
Albumin: 4.4 g/dL (ref 3.5–5.2)
BILIRUBIN DIRECT: 0.1 mg/dL (ref 0.0–0.3)
BILIRUBIN TOTAL: 0.6 mg/dL (ref 0.3–1.2)
Total Protein: 7.5 g/dL (ref 6.0–8.3)

## 2014-01-13 LAB — TSH: TSH: 1.39 u[IU]/mL (ref 0.35–5.50)

## 2014-01-13 MED ORDER — OMEPRAZOLE 20 MG PO CPDR: 20.0000 mg | DELAYED_RELEASE_CAPSULE | Freq: Every day | ORAL | Status: DC

## 2014-01-13 NOTE — Patient Instructions (Signed)
Today we updated your med list in our EPIC system...    Continue your current medications the same...    We refilled the meds you requested...  Today we did your follow up FASTING blood work...    We will contact you w/ the results when available...   Keep up the good work w/ your diet & exercise program...  Call for any questions...  Let's plan a follow up primary care appt in 20mo, sooner if needed for problems.Marland KitchenMarland Kitchen

## 2014-01-13 NOTE — Progress Notes (Signed)
Subjective:     Patient ID: Jason [name], male   DOB: July 24, 1941, 73 y.o.   MRN: [id number]  HPI 73 y/o WM here for a follow up appt... Jason [name] is the son-in-law of Jason [name]...   ~  January 03, 2012:  55mo ROV & Jason [name] notes under incr stress due to death of 2 friends this past yr, sister dx w/ alz, & nephew's child has autism; Jason [name] is torn betw retiring or not;  Due to all this his BP has been elevated> seen in ER & started on Norvasc5mg ;  Jason [name] saw TP & Norvasc helping, she rec f/u w/ Psychiatrist in [place] & DrAshby increased his EffexorXR75 to 2/d & added Klonopin 0.5mg  Bid==> improved;  Jason [name] requested that we check his VitD level & Jason [name] will return for Fasting blood work at his convenience... LABS 1/13:  FLP- at goals on Simva20;  Chems- wnl;  CBC- wnl;  TSH=1.40;  PSA=0.53;  VitD=15...   ~  July 03, 2012:  63mo ROV & Gerson is improved, feeling well w/o new complaints or concerns... BP controlled on meds; denies CP, palpit, dizzy, SOB, etc; Chol looks good on Simva20;  See prob list below for updates >>  ~  January 07, 2013:  48mo ROV & Jason [name] has had a good interval, one bout of prostatitis treated by DrOttelin, otherw w/o new complaints or concerns;  We reviewed the following medical problems during today's office visit >>      HBP> on Amlod5; BP= 120/84 & similar at home; Jason [name] denies CP, palpit, dizzy, SOB, edema, etc...    PAF> on ASA81; Jason [name] is maintaining NSR- EKG w/ occas PACs; denies CP, palpit, etc...    Chol> on Simva40, FishOil, CoQ10; FLP shows TChol 197, TG 82, HDL 58, LDL 123; rec better diet, get wt down, same med...    GU- Jason [name] had an epis of prostatitis treated by drOttelin w/ Cipro & resolved...    DJD> on Glucosamine & OTC analgesics prn...    Vit D defic> on VitD 50K weekly; Labs show VitD level = 32, rec to continue 50K weekly...    Hx Psychiatic disorder> on Klonopin0.5Bid, Cymbalta60, Remeron15-1/2 from DrCottle... We reviewed prob list, meds, xrays and labs> see below for updates >> Jason [name]  had the 2013 Flu vaccine 10/13  CXR 1/14 showed borderline heart size, clear lungs, NAD.Jason KitchenMarland [name]  EKG 1/14 showed NSR, rate91, rare PAC, NSSTTWA, NAD...  LABS 1/14:  FLP- ok x LDL=123 on Simva20;  Chems- wnl;  CBC- wnl;  TSH=1.38;  VitD=32;  Uric=7.8.Jason KitchenMarland [name]  ~  July 09, 2013:  60mo ROV & Jason [name] notes that Jason [name] retired this past yr- doing satis w/o new complaints or concerns... We reviewed the following medical problems during today's office visit >>     HBP> on Amlod5; BP= 126/78 & similar at home; Jason [name] denies CP, palpit, dizzy, SOB, edema, etc...    PAF> on ASA81; Jason [name] is maintaining NSR- EKG w/ occas PACs; denies CP, palpit, etc...    Chol> on Simva40 (but only takes irregularly), FishOil, CoQ10; FLP 1/14 showed TChol 197, TG 82, HDL 58, LDL 123; rec take med daily, better diet, get wt down...    GU- Jason [name] had an epis of prostatitis treated by DrOttelin w/ Cipro & resolved...    DJD> on Glucosamine & OTC analgesics prn...    Vit D defic> on VitD 50K weekly; Labs 1/14 showed VitD level = 32, rec to continue 50K weekly...    Hx Psychiatic disorder>  on Klonopin0.5Bid, Cymbalta60, Remeron15-1/2, and now Abilify2 from DrCottle... We reviewed prob list, meds, xrays and labs> see below for updates >>   ~  January 13, 2013:  78mo ROV & Jason [name]' CC is his arthritis- on Mobic7.5 as needed...     HBP> on Amlod5; BP= 116/84 & similar at home; Jason [name] denies CP, palpit, dizzy, SOB, edema, etc...    PAF> on ASA81; Jason [name] is maintaining NSR- EKG w/ occas PACs; denies CP, palpit, etc...    Chol> on Simva20, FishOil, CoQ10; FLP 1/15 showed TChol 150, TG 61, HDL 60, LDL 78; continue same....    GI- Jason [name] had eval by DrJacobs 11/14, chr nausea related to his depression meds, Rx Prilosec & Zofran (helps)...    GU- Jason [name] had an epis of prostatitis treated by DrOttelin w/ Cipro & resolved...    DJD> on Glucosamine, Mobic7.5, & OTC analgesics prn...    Vit D defic> on VitD 50K weekly; Labs 1/15 showed VitD level = 34, rec to continue 50K weekly...     Hx Psychiatic disorder> on Klonopin0.5Bid, Cymbalta60, Remeron15-1/2, and Abilify2 from DrCottle... We reviewed prob list, meds, xrays and labs> see below for updates >> Jason [name] had the 2014 Flu vaccine in Oct...  LABS 1/15:  FLP- at goals on Simva20;  Chems- wnl;  CBC- wnl;  TSH- 1.39;  VitD=34...           Problem List:  Hx of PNEUMONIA (ICD-486) - Jason [name] had "walking pneumonia" in 2006 treated by Jason [name];  Jason [name] is a never smoker... ~  CXR 1/14 showed borderline heart size, clear lungs, NAD...  HYPERTENSION >> mild HBP assoc w/ stress & went to ER in 2012 w/ NORVASC 5mg /d started; f/u BP checks here ok on the Norvasc- continue same. ~  1/13:  BP= 110/88 & feeling better> denies CP, palpit, dizzy, SOB, edema, or cerebral ischemic symptoms... ~  7/13:  BP= 108/78 & Jason [name] remains asymptomatic ~  1/14: on Amlod5; BP= 120/84 & similar at home; Jason [name] denies CP, palpit, dizzy, SOB, edema, etc. ~  7/15: on Amlod5; BP= 126/78 & similar at home; Jason [name] denies CP, palpit, dizzy, SOB, edema, etc. ~  1/15: on Amlod5; BP= 116/84 & similar at home; Jason [name] remains relatively asymptomatic...  Hx of PAROXYSMAL ATRIAL FIBRILLATION (ICD-427.31) - on ASA 81mg /d...remote hx of PAF- details unknown... Jason [name] has been in NSR without ectopy since we have known him starting in Jul07... denies CP, palpit, dizzy, syncope, SOB, edema, etc...  Jason [name] knows to avoid caffeine, pseudoephedrine etc... ~  EKG 1/14 showed NSR, rate91, rare PAC, NSSTTWA, NAD.Jason [name].  ~  7/14: on ASA81; Jason [name] is maintaining NSR- EKG w/ occas PACs; denies CP, palpit, etc.  HYPERCHOLESTEROLEMIA (ICD-272.0) - on SIMVASTATIN 40mg - taking 1/2 tab daily + FishOil etc... ~  FLP 7/07 on diet alone showed TChol 254, TG 70, HDL 48, LDL 209... needs Rx- start Simvastatin40. ~  [place] 9/07 on Zocor40 showed TChol 150, TG 46, HDL 46, LDL 94... great! continue same... ~  [place] 7/09 on Simva40 showed TChol 168, TG 79, HDL 48, LDL 105 ~  FLP 11/10 on Simva40 showed TChol 152, TG 63, HDL 55, LDL 84... Jason [name]  decr Simva to 20mg /d on his own. ~  [place] 3/12 on Simva20 showed TChol 165, TG 59, HDL 50, LDL 103 ~  FLP 3/13 on Simva20 showed TChol 144, TG 61, HDL 49, LDL 83... Continue same. ~  FLP 1/14 on Simva20 showed TChol 197, TG 82, HDL 58, LDL 123 ~  FLP 1/15 on Simva20 showed TChol 150, TG 61, HDL 60, LDL 78  DEGENERATIVE JOINT DISEASE, MILD (ICD-715.90) GOUT (ICD-274.9) - ? hx of gout in the past... treated at [facility] w/ "antibiotic" per pt... Jason [name] uses OTC Rx Prn (Ibuprofen, cherry extract)... ~  labs 11/10 showed Uric= 7.5 (4-7.8)... ~  Labs 1/14 showed Uric= 7.8  VITAMIN D DEFICIENCY >> on VIT D 50K weekly... ~  Labs 1/13 showed Vit D level = 15... rec to start on Vit D 50K weekly... ~  Labs 1/14 showed Vit D level = 32... Continue 50K weekly... ~  Labs 1/15 showed Vit D level = 34... rec to take VitD 50K weekly  Hx of PSYCHIATRIC DISORDER (ICD-300.9) - hx of "physical breakdown" age 29... Prev followed by Jason [name], Psychiatrist in [place] on [place]; now followed by DrCottle on Cymbalta, Remeron, Klonopin... ~  2012:  Pt noted increased stress & DrAshby icreased his EFFEXOR XR to 150mg /d & added KLONOPIN 0.5mg  Bid... ~  1/14: on Klonopin0.5Bid, Cymbalta60, Remeron15-1/2 from DrCottle... ~  7/14:  on Klonopin0.5Bid, Cymbalta60, Remeron15-1/2, and now Abilify2 from [place].  Hx SHINGLES >> Jason [name] reports episode of shingles involving the right side of his face in ~1998... ~  7/13:  We discussed shingles vaccine but Jason [name] has decided to hold off for now...  Health Maintenance:  ~ GI: Followed by DrJacobs, neg colon 2007- no divertics, no polyps, f/u due 2017.  ~ GU: Asymptomatic, norm DRE, norm PSAs, atrophic testes on exam- feels well, good energy, etc.  ~ Immunizations: Jason [name] gets the yearly flu vaccine; Had [place] in 2007 (age 9).   Past Surgical History  Procedure Laterality Date  . Neg hx       Outpatient Encounter Prescriptions as of 01/13/2014  Medication Sig  . amLODipine  (NORVASC) 5 MG tablet TAKE 1 TABLET DAILY  . ARIPiprazole (ABILIFY) 2 MG tablet Take 2 mg by mouth daily.  Jason [name] aspirin 81 MG tablet Take 81 mg by mouth daily.    Jason [name] desvenlafaxine (PRISTIQ) 100 MG 24 hr tablet Take 100 mg by mouth daily.  Jason [name] gabapentin (NEURONTIN) 300 MG capsule Take 300 mg by mouth. 2-3 tablets daily  . mirtazapine (REMERON) 30 MG tablet Take 30 mg by mouth at bedtime.  . ondansetron (ZOFRAN) 4 MG tablet Take 1 tablet (4 mg total) by mouth every 8 (eight) hours as needed for nausea or vomiting.  . simvastatin (ZOCOR) 40 MG tablet TAKE AS DIRECTED  . Vitamin D, Ergocalciferol, (DRISDOL) 50000 UNITS CAPS capsule TAKE 1 CAPSULE EVERY 7 DAYS  . [DISCONTINUED] clonazePAM (KLONOPIN) 0.5 MG tablet Take 1 tablet (0.5 mg total) by mouth 2 (two) times daily as needed.    No Known Allergies   Current Medications, Allergies, Past Medical History, Past Surgical History, Family History, and Social History were reviewed in [employer] record.    Review of Systems         The patient complains of joint pain and arthritis.  The patient denies fever, chills, sweats, anorexia, fatigue, weakness, malaise, weight loss, sleep disorder, blurring, diplopia, eye irritation, eye discharge, vision loss, eye pain, photophobia, earache, ear discharge, tinnitus, decreased hearing, nasal congestion, nosebleeds, sore throat, hoarseness, chest pain, palpitations, syncope, dyspnea on exertion, orthopnea, PND, peripheral edema, cough, dyspnea at rest, excessive sputum, hemoptysis, wheezing, pleurisy, nausea, vomiting, diarrhea, constipation, change in bowel habits, abdominal pain, melena, hematochezia, jaundice, gas/bloating, indigestion/heartburn, dysphagia, odynophagia, dysuria, hematuria, urinary frequency, urinary hesitancy, nocturia, incontinence, back pain, joint swelling,  muscle cramps, muscle weakness, stiffness, sciatica, restless legs, leg pain at night, leg pain with exertion, rash,  itching, dryness, suspicious lesions, paralysis, paresthesias, seizures, tremors, vertigo, transient blindness, frequent falls, frequent headaches, difficulty walking, depression, anxiety, memory loss, confusion, cold intolerance, heat intolerance, polydipsia, polyphagia, polyuria, unusual weight change, abnormal bruising, bleeding, enlarged lymph nodes, urticaria, allergic rash, hay fever, and recurrent infections.     Objective:   Physical Exam     WD, WN, 73 y/o WM in NAD... GENERAL:  Alert & oriented; pleasant & cooperative... HEENT:  [place]/AT, EOM-wnl, PERRLA, Fundi-benign, EACs-clear, TMs-wnl, NOSE-clear, THROAT-clear & wnl. NECK:  Supple w/ fairROM; no JVD; normal carotid impulses w/o bruits; no thyromegaly or nodules palpated; no lymphadenopathy. CHEST:  Clear to P & A; without wheezes/ rales/ or rhonchi. HEART:  Regular Rhythm; without murmurs/ rubs/ or gallops. ABDOMEN:  Soft & nontender; normal bowel sounds; no organomegaly or masses detected. RECTAL:  Neg - prostate 3+ & nontender w/o nodules; stool hematest neg. EXT: without deformities, mild arthritic changes; no varicose veins/ venous insuffic/ or edema. NEURO:  CN's intact; motor testing normal; sensory testing normal; gait normal & balance OK. DERM:  No lesions noted; no rash etc...  RADIOLOGY DATA:  Reviewed in the EPIC EMR & discussed w/ the patient...  LABORATORY DATA:  Reviewed in the EPIC EMR & discussed w/ the patient...   Assessment:      HBP>  Controlled on the Amlodipine 5mg /d; continue same, no salt, etc...  Hx PAF>  Maintaining NSR w/o CP, palpit, etc...  CHOL>  Stable on Simva20 w/ FLP at goal...  ORTHO> DJD, Gout>  Stable w/ OTC analgesics, no clinical gout attacks, we reviewed exercise recommendations...  Vit D Defic>  On Vit D 50K weekly...  Hx psychiatric illness>  Prev followed by psychiatrist Jason [name] in [place], [place]; now DrCottle in [place] on 4 meds as listed...     Plan:     Patient's  Medications  New Prescriptions   No medications on file  Previous Medications   AMLODIPINE (NORVASC) 5 MG TABLET    TAKE 1 TABLET DAILY   ARIPIPRAZOLE (ABILIFY) 2 MG TABLET    Take 2 mg by mouth daily.   ASPIRIN 81 MG TABLET    Take 81 mg by mouth daily.     CLONAZEPAM (KLONOPIN) 1 MG TABLET    Take 1 mg by mouth daily.   COENZYME Q10 (COQ10) 100 MG CAPS    Take 1 capsule by mouth daily.   DESVENLAFAXINE (PRISTIQ) 100 MG 24 HR TABLET    Take 150 mg by mouth daily.    GABAPENTIN (NEURONTIN) 300 MG CAPSULE    Take 200 mg by mouth daily.    MELOXICAM (MOBIC) 7.5 MG TABLET    Take 7.5 mg by mouth daily.   MULTIPLE VITAMINS-MINERALS (MULTIVITAMIN & MINERAL PO)    Take 1/2 tablet by mouth daily   OMEGA-3 KRILL OIL 500 MG CAPS    Take 1 capsule by mouth daily.   ONDANSETRON (ZOFRAN) 4 MG TABLET    Take 1 tablet (4 mg total) by mouth every 8 (eight) hours as needed for nausea or vomiting.   PROBIOTIC PRODUCT (PROBIOTIC DAILY PO)    Take 1 tablet by mouth every other day.   VITAMIN D, ERGOCALCIFEROL, (DRISDOL) 50000 UNITS CAPS CAPSULE    TAKE 1 CAPSULE EVERY 7 DAYS  Modified Medications   Modified Medication Previous Medication   OMEPRAZOLE (PRILOSEC) 20 MG CAPSULE omeprazole (PRILOSEC) 20 MG capsule  Take 1 capsule (20 mg total) by mouth daily.    Take 20 mg by mouth daily.   SIMVASTATIN (ZOCOR) 20 MG TABLET simvastatin (ZOCOR) 20 MG tablet      Take 1 tablet (20 mg total) by mouth daily.    Take 20 mg by mouth daily.   SIMVASTATIN (ZOCOR) 40 MG TABLET simvastatin (ZOCOR) 40 MG tablet      sim    TAKE AS DIRECTED  Discontinued Medications   CLONAZEPAM (KLONOPIN) 0.5 MG TABLET    Take 1 tablet (0.5 mg total) by mouth 2 (two) times daily as needed.   MIRTAZAPINE (REMERON) 30 MG TABLET    Take 30 mg by mouth at bedtime.

## 2014-01-14 LAB — VITAMIN D 25 HYDROXY (VIT D DEFICIENCY, FRACTURES): Vit D, 25-Hydroxy: 34 ng/mL (ref 30–89)

## 2014-01-22 ENCOUNTER — Encounter: Payer: Self-pay | Admitting: Gastroenterology

## 2014-01-22 ENCOUNTER — Ambulatory Visit (AMBULATORY_SURGERY_CENTER): Payer: Medicare Other | Admitting: Gastroenterology

## 2014-01-22 VITALS — BP 123/90 | HR 78 | Temp 96.1°F | Resp 12 | Ht 74.0 in | Wt 222.0 lb

## 2014-01-22 DIAGNOSIS — R110 Nausea: Secondary | ICD-10-CM | POA: Diagnosis not present

## 2014-01-22 DIAGNOSIS — K2990 Gastroduodenitis, unspecified, without bleeding: Secondary | ICD-10-CM

## 2014-01-22 DIAGNOSIS — I10 Essential (primary) hypertension: Secondary | ICD-10-CM | POA: Diagnosis not present

## 2014-01-22 DIAGNOSIS — R112 Nausea with vomiting, unspecified: Secondary | ICD-10-CM | POA: Diagnosis not present

## 2014-01-22 DIAGNOSIS — I4891 Unspecified atrial fibrillation: Secondary | ICD-10-CM | POA: Diagnosis not present

## 2014-01-22 DIAGNOSIS — F411 Generalized anxiety disorder: Secondary | ICD-10-CM | POA: Diagnosis not present

## 2014-01-22 DIAGNOSIS — D131 Benign neoplasm of stomach: Secondary | ICD-10-CM | POA: Diagnosis not present

## 2014-01-22 DIAGNOSIS — K2970 Gastritis, unspecified, without bleeding: Secondary | ICD-10-CM

## 2014-01-22 MED ORDER — SODIUM CHLORIDE 0.9 % IV SOLN: 500.0000 mL | INTRAVENOUS | Status: DC

## 2014-01-22 NOTE — Patient Instructions (Signed)
Discharge instructions with verbal understanding. Handout on gastritis . Biopsies taken. Resume previous medications. YOU HAD AN ENDOSCOPIC PROCEDURE TODAY AT [facility]: Refer to the procedure report that was given to you for any specific questions about what was found during the examination.  If the procedure report does not answer your questions, please call your gastroenterologist to clarify.  If you requested that your care partner not be given the details of your procedure findings, then the procedure report has been included in a sealed envelope for you to review at your convenience later.  YOU SHOULD EXPECT: Some feelings of bloating in the abdomen. Passage of more gas than usual.  Walking can help get rid of the air that was put into your GI tract during the procedure and reduce the bloating. If you had a lower endoscopy (such as a colonoscopy or flexible sigmoidoscopy) you may notice spotting of blood in your stool or on the toilet paper. If you underwent a bowel prep for your procedure, then you may not have a normal bowel movement for a few days.  DIET: Your first meal following the procedure should be a light meal and then it is ok to progress to your normal diet.  A half-sandwich or bowl of soup is an example of a good first meal.  Heavy or fried foods are harder to digest and may make you feel nauseous or bloated.  Likewise meals heavy in dairy and vegetables can cause extra gas to form and this can also increase the bloating.  Drink plenty of fluids but you should avoid alcoholic beverages for 24 hours.  ACTIVITY: Your care partner should take you home directly after the procedure.  You should plan to take it easy, moving slowly for the rest of the day.  You can resume normal activity the day after the procedure however you should NOT DRIVE or use heavy machinery for 24 hours (because of the sedation medicines used during the test).    SYMPTOMS TO REPORT IMMEDIATELY: A  gastroenterologist can be reached at any hour.  During normal business hours, 8:30 AM to 5:00 PM Monday through Friday, call [phone].  After hours and on weekends, please call the GI answering service at [phone] who will take a message and have the physician on call contact you.   Following upper endoscopy (EGD)  Vomiting of blood or coffee ground material  New chest pain or pain under the shoulder blades  Painful or persistently difficult swallowing  New shortness of breath  Fever of 100F or higher  Black, tarry-looking stools  FOLLOW UP: If any biopsies were taken you will be contacted by phone or by letter within the next 1-3 weeks.  Call your gastroenterologist if you have not heard about the biopsies in 3 weeks.  Our staff will call the home number listed on your records the next business day following your procedure to check on you and address any questions or concerns that you may have at that time regarding the information given to you following your procedure. This is a courtesy call and so if there is no answer at the home number and we have not heard from you through the emergency physician on call, we will assume that you have returned to your regular daily activities without incident.  SIGNATURES/CONFIDENTIALITY: You and/or your care partner have signed paperwork which will be entered into your electronic medical record.  These signatures attest to the fact that that the information above  on your After Visit Summary has been reviewed and is understood.  Full responsibility of the confidentiality of this discharge information lies with you and/or your care-partner.

## 2014-01-22 NOTE — Progress Notes (Signed)
Called to room to assist during endoscopic procedure.  Patient ID and intended procedure confirmed with present staff. Received instructions for my participation in the procedure from the performing physician.  

## 2014-01-22 NOTE — Op Note (Signed)
[facility]  [address] [place], [id number]   ENDOSCOPY PROCEDURE REPORT  PATIENT: Jason, [name]  MR#: [id number] BIRTHDATE: Apr 29, 1941 , 69  yrs. old GENDER: Male ENDOSCOPIST: Milus Banister, MD PROCEDURE DATE:  01/22/2014 PROCEDURE:  EGD w/ biopsy ASA CLASS:     Class II INDICATIONS:  nausea. MEDICATIONS: MAC sedation, administered by CRNA and propofol (Diprivan) 50mg  IV TOPICAL ANESTHETIC: Cetacaine Spray  DESCRIPTION OF PROCEDURE: After the risks benefits and alternatives of the procedure were thoroughly explained, informed consent was obtained.  The LB [id number] endoscope was introduced through the mouth and advanced to the second portion of the duodenum. Without limitations.  The instrument was slowly withdrawn as the mucosa was fully examined.   There was mild, non-specific distal gastritis.  This was biopsied and sent to pathology.  The examination was otherwise normal. Retroflexed views revealed no abnormalities.     The scope was then withdrawn from the patient and the procedure completed. COMPLICATIONS: There were no complications.  ENDOSCOPIC IMPRESSION: There was mild, non-specific distal gastritis.  This was biopsied and sent to pathology. The examination was otherwise normal.  RECOMMENDATIONS: If biopsies show H.  pylori, you will be started on appropriate antibiotics.  Otherwise, continue to use zofran (ondansetron) for your intermittent nausea the is likely medicine related.   eSigned:  Milus Banister, MD 01/22/2014 10:51 AM   CC: Teressa Lower, MD

## 2014-01-22 NOTE — Progress Notes (Signed)
Lidocaine-40mg IV prior to Propofol InductionPropofol given over incremental dosages 

## 2014-01-23 ENCOUNTER — Telehealth: Payer: Self-pay | Admitting: *Deleted

## 2014-01-23 NOTE — Telephone Encounter (Signed)
  Follow up Call-  Call back number 01/22/2014  Post procedure Call Back phone  # [phone]  Permission to leave phone message Yes     Patient questions:  Do you have a fever, pain , or abdominal swelling? no Pain Score  0 *  Have you tolerated food without any problems? yes  Have you been able to return to your normal activities? yes  Do you have any questions about your discharge instructions: Diet   no Medications  no Follow up visit  no  Do you have questions or concerns about your Care? no  Actions: * If pain score is 4 or above: No action needed, pain <4.

## 2014-01-24 ENCOUNTER — Encounter: Payer: Medicare Other | Admitting: Gastroenterology

## 2014-01-24 DIAGNOSIS — M19019 Primary osteoarthritis, unspecified shoulder: Secondary | ICD-10-CM | POA: Diagnosis not present

## 2014-01-24 DIAGNOSIS — M169 Osteoarthritis of hip, unspecified: Secondary | ICD-10-CM | POA: Diagnosis not present

## 2014-01-24 DIAGNOSIS — M1610 Unilateral primary osteoarthritis, unspecified hip: Secondary | ICD-10-CM | POA: Diagnosis not present

## 2014-01-30 ENCOUNTER — Encounter: Payer: Self-pay | Admitting: Gastroenterology

## 2014-01-30 DIAGNOSIS — M1610 Unilateral primary osteoarthritis, unspecified hip: Secondary | ICD-10-CM | POA: Diagnosis not present

## 2014-01-30 DIAGNOSIS — M169 Osteoarthritis of hip, unspecified: Secondary | ICD-10-CM | POA: Diagnosis not present

## 2014-01-30 DIAGNOSIS — M25559 Pain in unspecified hip: Secondary | ICD-10-CM | POA: Diagnosis not present

## 2014-01-31 DIAGNOSIS — M19019 Primary osteoarthritis, unspecified shoulder: Secondary | ICD-10-CM | POA: Diagnosis not present

## 2014-01-31 DIAGNOSIS — M169 Osteoarthritis of hip, unspecified: Secondary | ICD-10-CM | POA: Diagnosis not present

## 2014-01-31 DIAGNOSIS — F332 Major depressive disorder, recurrent severe without psychotic features: Secondary | ICD-10-CM | POA: Diagnosis not present

## 2014-01-31 DIAGNOSIS — M1610 Unilateral primary osteoarthritis, unspecified hip: Secondary | ICD-10-CM | POA: Diagnosis not present

## 2014-02-04 DIAGNOSIS — M169 Osteoarthritis of hip, unspecified: Secondary | ICD-10-CM | POA: Diagnosis not present

## 2014-02-04 DIAGNOSIS — M19019 Primary osteoarthritis, unspecified shoulder: Secondary | ICD-10-CM | POA: Diagnosis not present

## 2014-02-04 DIAGNOSIS — M1610 Unilateral primary osteoarthritis, unspecified hip: Secondary | ICD-10-CM | POA: Diagnosis not present

## 2014-02-05 DIAGNOSIS — M169 Osteoarthritis of hip, unspecified: Secondary | ICD-10-CM | POA: Diagnosis not present

## 2014-02-05 DIAGNOSIS — M19019 Primary osteoarthritis, unspecified shoulder: Secondary | ICD-10-CM | POA: Diagnosis not present

## 2014-02-05 DIAGNOSIS — M1610 Unilateral primary osteoarthritis, unspecified hip: Secondary | ICD-10-CM | POA: Diagnosis not present

## 2014-02-12 DIAGNOSIS — M1610 Unilateral primary osteoarthritis, unspecified hip: Secondary | ICD-10-CM | POA: Diagnosis not present

## 2014-02-12 DIAGNOSIS — M169 Osteoarthritis of hip, unspecified: Secondary | ICD-10-CM | POA: Diagnosis not present

## 2014-02-12 DIAGNOSIS — M19019 Primary osteoarthritis, unspecified shoulder: Secondary | ICD-10-CM | POA: Diagnosis not present

## 2014-02-14 DIAGNOSIS — M169 Osteoarthritis of hip, unspecified: Secondary | ICD-10-CM | POA: Diagnosis not present

## 2014-02-14 DIAGNOSIS — M1610 Unilateral primary osteoarthritis, unspecified hip: Secondary | ICD-10-CM | POA: Diagnosis not present

## 2014-02-14 DIAGNOSIS — M19019 Primary osteoarthritis, unspecified shoulder: Secondary | ICD-10-CM | POA: Diagnosis not present

## 2014-02-17 DIAGNOSIS — M169 Osteoarthritis of hip, unspecified: Secondary | ICD-10-CM | POA: Diagnosis not present

## 2014-02-17 DIAGNOSIS — M1610 Unilateral primary osteoarthritis, unspecified hip: Secondary | ICD-10-CM | POA: Diagnosis not present

## 2014-02-17 DIAGNOSIS — M19019 Primary osteoarthritis, unspecified shoulder: Secondary | ICD-10-CM | POA: Diagnosis not present

## 2014-02-19 DIAGNOSIS — M19019 Primary osteoarthritis, unspecified shoulder: Secondary | ICD-10-CM | POA: Diagnosis not present

## 2014-02-19 DIAGNOSIS — M169 Osteoarthritis of hip, unspecified: Secondary | ICD-10-CM | POA: Diagnosis not present

## 2014-02-19 DIAGNOSIS — M1610 Unilateral primary osteoarthritis, unspecified hip: Secondary | ICD-10-CM | POA: Diagnosis not present

## 2014-03-06 DIAGNOSIS — M169 Osteoarthritis of hip, unspecified: Secondary | ICD-10-CM | POA: Diagnosis not present

## 2014-03-06 DIAGNOSIS — M19019 Primary osteoarthritis, unspecified shoulder: Secondary | ICD-10-CM | POA: Diagnosis not present

## 2014-03-06 DIAGNOSIS — M1610 Unilateral primary osteoarthritis, unspecified hip: Secondary | ICD-10-CM | POA: Diagnosis not present

## 2014-03-14 DIAGNOSIS — F332 Major depressive disorder, recurrent severe without psychotic features: Secondary | ICD-10-CM | POA: Diagnosis not present

## 2014-04-02 ENCOUNTER — Telehealth: Payer: Self-pay | Admitting: Pulmonary Disease

## 2014-04-02 NOTE — Telephone Encounter (Signed)
Per SN---  Yes seems possible that pristiq is involved since the pts BP's in the office have been 110-130 over the last 2-3 years on norvasc 5 mg daily.  SN would rather not increase the norvasc at the time or add any other meds at this time.  Work ok weight reduction and no salt in his diet, follow up with DR. Caudle about the pristiq and watch BP at home.  Call if not improving for further recs.  thanks

## 2014-04-02 NOTE — Telephone Encounter (Signed)
Pt advised of recs.[place], CMA

## 2014-04-02 NOTE — Telephone Encounter (Signed)
Spoke with the pt He states having increased BP and heart rate for the past 2 wks  BP in the 140/95 range and pulse 100-120 at rest  He states that he thinks that this is b/c of increased Pristiq started by Dr. Charlott Holler  This is one of the s/e of that med He has contacted Dr Charlott Holler with no response back yet  Pt denies any HA or vision changes  Taking norvasc 5 mg daily  Please advise thanks! No Known Allergies

## 2014-04-04 DIAGNOSIS — F332 Major depressive disorder, recurrent severe without psychotic features: Secondary | ICD-10-CM | POA: Diagnosis not present

## 2014-04-23 ENCOUNTER — Telehealth: Payer: Self-pay | Admitting: Pulmonary Disease

## 2014-04-23 MED ORDER — SIMVASTATIN 20 MG PO TABS: 20.0000 mg | ORAL_TABLET | Freq: Every day | ORAL | Status: DC

## 2014-04-23 NOTE — Telephone Encounter (Signed)
Spoke with pt's wife. Rx has been sent in to [facility] to last until he sees new PCP. Nothing further is needed.

## 2014-04-25 DIAGNOSIS — F332 Major depressive disorder, recurrent severe without psychotic features: Secondary | ICD-10-CM | POA: Diagnosis not present

## 2014-04-29 DIAGNOSIS — F332 Major depressive disorder, recurrent severe without psychotic features: Secondary | ICD-10-CM | POA: Diagnosis not present

## 2014-05-15 DIAGNOSIS — F332 Major depressive disorder, recurrent severe without psychotic features: Secondary | ICD-10-CM | POA: Diagnosis not present

## 2014-05-29 DIAGNOSIS — F332 Major depressive disorder, recurrent severe without psychotic features: Secondary | ICD-10-CM | POA: Diagnosis not present

## 2014-06-02 ENCOUNTER — Ambulatory Visit: Payer: Self-pay | Admitting: Family Medicine

## 2014-07-01 ENCOUNTER — Other Ambulatory Visit: Payer: Self-pay | Admitting: Gastroenterology

## 2014-07-14 ENCOUNTER — Telehealth: Payer: Self-pay | Admitting: Gastroenterology

## 2014-07-14 DIAGNOSIS — F332 Major depressive disorder, recurrent severe without psychotic features: Secondary | ICD-10-CM | POA: Diagnosis not present

## 2014-07-14 NOTE — Telephone Encounter (Signed)
Left message for pt to call back  °

## 2014-07-14 NOTE — Telephone Encounter (Signed)
States her husband was given Zofran 4mg  to take daily for nausea. States it does not seem to be working as well. Discussed with wife that the pt may take 2 tablets for a total of Zofran 8mg . Pts wife verbalized understanding.

## 2014-07-17 ENCOUNTER — Ambulatory Visit (INDEPENDENT_AMBULATORY_CARE_PROVIDER_SITE_OTHER): Payer: Medicare Other | Admitting: Family Medicine

## 2014-07-17 ENCOUNTER — Encounter: Payer: Self-pay | Admitting: Family Medicine

## 2014-07-17 VITALS — BP 126/70 | HR 84 | Temp 98.5°F | Wt 220.0 lb

## 2014-07-17 DIAGNOSIS — I10 Essential (primary) hypertension: Secondary | ICD-10-CM

## 2014-07-17 DIAGNOSIS — E559 Vitamin D deficiency, unspecified: Secondary | ICD-10-CM | POA: Diagnosis not present

## 2014-07-17 DIAGNOSIS — E7800 Pure hypercholesterolemia, unspecified: Secondary | ICD-10-CM

## 2014-07-17 DIAGNOSIS — Z23 Encounter for immunization: Secondary | ICD-10-CM

## 2014-07-17 NOTE — Progress Notes (Signed)
   Subjective:    Patient ID: Jason [name], male    DOB: 1941-06-21, 73 y.o.   MRN: [id number]  HPI Patient is seen to establish care. He has history of recurrent depression, hypertension, dyslipidemia, osteoarthritis, vitamin D deficiency. There is listing of atrial fibrillation though patient denies any history of this. He had episode several years ago what sounds like possible PSVT. He denies ever being treated with Coumadin or other anticoagulants.  His depression is followed by psychiatry. Currently in process of tapering off Lexapro and starting sertraline. He also takes Remeron at night. Depression stable.  Hypertension treated with amlodipine. Has occasional mild edema late in the day. No dyspnea. Walks for exercise. No recent chest pains. No cardiac history. Hyperlipidemia treated with simvastatin 20 mg daily. Compliant with therapy.  No history of Prevnar 13. He gets yearly flu vaccine.  Past Medical History  Diagnosis Date  . Routine general medical examination at a health care facility   . Pneumonia, organism unspecified   . Atrial fibrillation   . Pure hypercholesterolemia   . Osteoarthrosis, unspecified whether generalized or localized, unspecified site   . Gout, unspecified   . Unspecified nonpsychotic mental disorder   . Colon polyp   . Anxiety   . Depression   . Hypertension    Past Surgical History  Procedure Laterality Date  . Neg hx      reports that he has never smoked. He has never used smokeless tobacco. He reports that he does not drink alcohol or use illicit drugs. family history includes Arthritis in his other; Dementia in his mother; Heart failure in his father; Pneumonia in his father. There is no history of Colon cancer. No Known Allergies    Review of Systems  Constitutional: Negative for appetite change, fatigue and unexpected weight change.  Eyes: Negative for visual disturbance.  Respiratory: Negative for cough, chest tightness and shortness of  breath.   Cardiovascular: Negative for chest pain and palpitations.  Gastrointestinal: Negative for abdominal pain.  Endocrine: Negative for polydipsia and polyuria.  Genitourinary: Negative for dysuria.  Neurological: Negative for dizziness, syncope, weakness, light-headedness and headaches.  Psychiatric/Behavioral: Negative for suicidal ideas.       Objective:   Physical Exam  Constitutional: He is oriented to person, place, and time. He appears well-developed and well-nourished.  Neck: Neck supple. No thyromegaly present.  Cardiovascular: Normal rate and regular rhythm.  Exam reveals no gallop.   Pulmonary/Chest: Effort normal and breath sounds normal. No respiratory distress. He has no wheezes. He has no rales.  Musculoskeletal: He exhibits edema.  Very minimal trace edema legs bilaterally  Neurological: He is alert and oriented to person, place, and time.  Psychiatric: He has a normal mood and affect. His behavior is normal.          Assessment & Plan:  #1 hypertension. Stable and at goal. Continue amlodipine #2 history of recurrent depression followed by psychiatry #3 hyperlipidemia. Lipids from last January were checked and stable. Repeat at followup in 6 months #4 osteoarthritis involving multiple joints. Stable on meloxicam #5 health maintenance. Prevnar 13 given. Continue yearly flu vaccine

## 2014-07-17 NOTE — Progress Notes (Signed)
Pre visit review using our clinic review tool, if applicable. No additional management support is needed unless otherwise documented below in the visit note. 

## 2014-07-18 ENCOUNTER — Encounter: Payer: Self-pay | Admitting: Family Medicine

## 2014-07-18 ENCOUNTER — Telehealth: Payer: Self-pay

## 2014-07-18 NOTE — Addendum Note (Signed)
Addended by: Marcina Millard on: 07/18/2014 03:09 PM   Modules accepted: Orders

## 2014-07-18 NOTE — Telephone Encounter (Signed)
Opened by a mistake.  

## 2014-08-12 DIAGNOSIS — F332 Major depressive disorder, recurrent severe without psychotic features: Secondary | ICD-10-CM | POA: Diagnosis not present

## 2014-08-14 ENCOUNTER — Telehealth: Payer: Self-pay | Admitting: Gastroenterology

## 2014-08-14 MED ORDER — ONDANSETRON HCL 4 MG PO TABS: ORAL_TABLET | ORAL | Status: DC

## 2014-08-14 NOTE — Telephone Encounter (Signed)
New script sent to the pharmacy

## 2014-08-18 ENCOUNTER — Telehealth: Payer: Self-pay | Admitting: Gastroenterology

## 2014-08-18 MED ORDER — ONDANSETRON HCL 4 MG PO TABS: ORAL_TABLET | ORAL | Status: DC

## 2014-08-18 NOTE — Telephone Encounter (Signed)
Left message on machine to call back  

## 2014-08-18 NOTE — Telephone Encounter (Signed)
Pt aware that the zofran is 1-2 pills as needed for nausea refills have been sent

## 2014-09-10 DIAGNOSIS — F332 Major depressive disorder, recurrent severe without psychotic features: Secondary | ICD-10-CM | POA: Diagnosis not present

## 2014-09-27 ENCOUNTER — Other Ambulatory Visit: Payer: Self-pay | Admitting: Pulmonary Disease

## 2014-10-08 DIAGNOSIS — F332 Major depressive disorder, recurrent severe without psychotic features: Secondary | ICD-10-CM | POA: Diagnosis not present

## 2014-10-10 ENCOUNTER — Other Ambulatory Visit: Payer: Self-pay

## 2014-10-16 ENCOUNTER — Ambulatory Visit: Payer: Self-pay

## 2014-10-18 ENCOUNTER — Other Ambulatory Visit: Payer: Self-pay | Admitting: Pulmonary Disease

## 2014-10-20 ENCOUNTER — Telehealth: Payer: Self-pay | Admitting: Family Medicine

## 2014-10-20 DIAGNOSIS — H9113 Presbycusis, bilateral: Secondary | ICD-10-CM | POA: Diagnosis not present

## 2014-10-20 DIAGNOSIS — H6123 Impacted cerumen, bilateral: Secondary | ICD-10-CM | POA: Diagnosis not present

## 2014-10-20 MED ORDER — VITAMIN D (ERGOCALCIFEROL) 1.25 MG (50000 UNIT) PO CAPS: ORAL_CAPSULE | ORAL | Status: DC

## 2014-10-20 MED ORDER — SIMVASTATIN 20 MG PO TABS: 20.0000 mg | ORAL_TABLET | Freq: Every day | ORAL | Status: DC

## 2014-10-20 NOTE — Telephone Encounter (Signed)
Rx's sent to mail order.  

## 2014-10-20 NOTE — Telephone Encounter (Addendum)
Pt request refill simvastatin (ZOCOR) 20 MG tablet 90 day. Vitamin D, Ergocalciferol, (DRISDOL) 50000 UNITS CAPS capsule 90 day  Pt is a new pt w/ dr [name] since 7/23 and did not need rx at that time. [facility]

## 2014-10-23 ENCOUNTER — Ambulatory Visit (INDEPENDENT_AMBULATORY_CARE_PROVIDER_SITE_OTHER): Payer: Medicare Other

## 2014-10-23 DIAGNOSIS — Z23 Encounter for immunization: Secondary | ICD-10-CM | POA: Diagnosis not present

## 2014-11-10 DIAGNOSIS — F332 Major depressive disorder, recurrent severe without psychotic features: Secondary | ICD-10-CM | POA: Diagnosis not present

## 2014-11-12 ENCOUNTER — Telehealth: Payer: Self-pay | Admitting: Family Medicine

## 2014-11-12 MED ORDER — AMLODIPINE BESYLATE 5 MG PO TABS: 5.0000 mg | ORAL_TABLET | Freq: Every day | ORAL | Status: DC

## 2014-11-12 NOTE — Telephone Encounter (Signed)
Pt needs new rx amlodipine 5 mg #90 w/refills sent to [facility] [address]

## 2014-11-12 NOTE — Telephone Encounter (Signed)
Rx sent to pharmacy   

## 2014-12-16 DIAGNOSIS — F332 Major depressive disorder, recurrent severe without psychotic features: Secondary | ICD-10-CM | POA: Diagnosis not present

## 2015-01-15 DIAGNOSIS — F332 Major depressive disorder, recurrent severe without psychotic features: Secondary | ICD-10-CM | POA: Diagnosis not present

## 2015-01-19 ENCOUNTER — Encounter: Payer: Self-pay | Admitting: Family Medicine

## 2015-01-19 ENCOUNTER — Telehealth: Payer: Self-pay

## 2015-01-19 ENCOUNTER — Ambulatory Visit: Payer: Self-pay | Admitting: Family Medicine

## 2015-01-19 ENCOUNTER — Ambulatory Visit (INDEPENDENT_AMBULATORY_CARE_PROVIDER_SITE_OTHER): Payer: Medicare Other | Admitting: Family Medicine

## 2015-01-19 VITALS — BP 124/82 | HR 86 | Temp 98.4°F | Wt 215.0 lb

## 2015-01-19 DIAGNOSIS — E7800 Pure hypercholesterolemia, unspecified: Secondary | ICD-10-CM

## 2015-01-19 DIAGNOSIS — I10 Essential (primary) hypertension: Secondary | ICD-10-CM

## 2015-01-19 DIAGNOSIS — G4733 Obstructive sleep apnea (adult) (pediatric): Secondary | ICD-10-CM | POA: Diagnosis not present

## 2015-01-19 DIAGNOSIS — E559 Vitamin D deficiency, unspecified: Secondary | ICD-10-CM | POA: Diagnosis not present

## 2015-01-19 DIAGNOSIS — Z23 Encounter for immunization: Secondary | ICD-10-CM

## 2015-01-19 LAB — LIPID PANEL
CHOL/HDL RATIO: 3
Cholesterol: 157 mg/dL (ref 0–200)
HDL: 51 mg/dL (ref >39.00)
LDL Cholesterol: 87 mg/dL (ref 0–99)
NonHDL: 106
Triglycerides: 95 mg/dL (ref 0.0–149.0)
VLDL: 19 mg/dL (ref 0.0–40.0)

## 2015-01-19 LAB — BASIC METABOLIC PANEL
BUN: 15 mg/dL (ref 6–23)
CALCIUM: 9.9 mg/dL (ref 8.4–10.5)
CO2: 27 mEq/L (ref 19–32)
Chloride: 104 mEq/L (ref 96–112)
Creatinine, Ser: 0.87 mg/dL (ref 0.40–1.50)
GFR: 91.82 mL/min (ref >60.00)
GLUCOSE: 94 mg/dL (ref 70–99)
Potassium: 3.9 mEq/L (ref 3.5–5.1)
Sodium: 140 mEq/L (ref 135–145)

## 2015-01-19 LAB — HEPATIC FUNCTION PANEL
ALK PHOS: 61 U/L (ref 39–117)
ALT: 25 U/L (ref 0–53)
AST: 27 U/L (ref 0–37)
Albumin: 4.4 g/dL (ref 3.5–5.2)
BILIRUBIN TOTAL: 0.5 mg/dL (ref 0.2–1.2)
Bilirubin, Direct: 0.2 mg/dL (ref 0.0–0.3)
Total Protein: 7.2 g/dL (ref 6.0–8.3)

## 2015-01-19 LAB — VITAMIN D 25 HYDROXY (VIT D DEFICIENCY, FRACTURES): VITD: 29.07 ng/mL — ABNORMAL LOW (ref 30.00–100.00)

## 2015-01-19 NOTE — Progress Notes (Signed)
Pre visit review using our clinic review tool, if applicable. No additional management support is needed unless otherwise documented below in the visit note. 

## 2015-01-19 NOTE — Progress Notes (Signed)
   Subjective:    Patient ID: Jason [name], male    DOB: [birth year], 74 y.o.   MRN: [id number]  HPI Patient seen for routine medical follow-up. His chronic problems include history of vitamin D deficiency, recurrent depression, hypertension, hyperlipidemia, osteoarthritis. His problem list reveals history of atrial fibrillation though patient is not certain.  His medications are reviewed. Compliant with all. No recent chest pains. No dizziness. He has battled some ongoing fatigue issues and recently diagnosed with obstructive sleep apnea. He has follow-up study pending-apparently to look at CPAP titration.  These studies have been ordered by his psychiatrist.  He's had flu vaccination. Last tetanus estimated over 10 years.  Past Medical History  Diagnosis Date  . Routine general medical examination at a health care facility   . Pneumonia, organism unspecified   . Atrial fibrillation   . Pure hypercholesterolemia   . Osteoarthrosis, unspecified whether generalized or localized, unspecified site   . Gout, unspecified   . Unspecified nonpsychotic mental disorder   . Colon polyp   . Anxiety   . Depression   . Hypertension    Past Surgical History  Procedure Laterality Date  . Neg hx      reports that he has never smoked. He has never used smokeless tobacco. He reports that he does not drink alcohol or use illicit drugs. family history includes Arthritis in his other; Dementia in his mother; Heart failure in his father; Pneumonia in his father. There is no history of Colon cancer. No Known Allergies    Review of Systems  Constitutional: Positive for fatigue.  Eyes: Negative for visual disturbance.  Respiratory: Negative for cough, chest tightness and shortness of breath.   Cardiovascular: Negative for chest pain, palpitations and leg swelling.  Endocrine: Negative for polydipsia and polyuria.  Genitourinary: Negative for dysuria.  Neurological: Negative for dizziness, syncope,  weakness, light-headedness and headaches.       Objective:   Physical Exam  Constitutional: He is oriented to person, place, and time. He appears well-developed and well-nourished.  Neck: Neck supple. No JVD present.  Cardiovascular: Normal rate and regular rhythm.  Exam reveals no gallop.   Pulmonary/Chest: Effort normal and breath sounds normal. No respiratory distress. He has no wheezes. He has no rales.  Musculoskeletal: He exhibits no edema.  Neurological: He is alert and oriented to person, place, and time.          Assessment & Plan:  #1 hypertension. Well controlled. Continue current medications #2 dyslipidemia. Check lipid and hepatic panel. Continue simvastatin #3 history of vitamin D deficiency. Repeat 25-hydroxy vitamin D. He is currently taking supplemental 50,000 international units once weekly #4 health maintenance. Last tetanus unknown. Tetanus booster given

## 2015-01-19 NOTE — Patient Instructions (Signed)
We will call you regarding labs done today Let us know if you decide to pursue getting the Shingles vaccine.

## 2015-01-19 NOTE — Telephone Encounter (Signed)
Pt wanted to let you know the medication that you all were talking about today is Lanoxin.

## 2015-01-20 NOTE — Addendum Note (Signed)
Addended by: Marcina Millard on: 01/20/2015 08:36 AM   Modules accepted: Medications

## 2015-02-02 DIAGNOSIS — G4733 Obstructive sleep apnea (adult) (pediatric): Secondary | ICD-10-CM | POA: Diagnosis not present

## 2015-02-02 DIAGNOSIS — R5383 Other fatigue: Secondary | ICD-10-CM | POA: Diagnosis not present

## 2015-02-02 DIAGNOSIS — F329 Major depressive disorder, single episode, unspecified: Secondary | ICD-10-CM | POA: Diagnosis not present

## 2015-02-02 DIAGNOSIS — G4710 Hypersomnia, unspecified: Secondary | ICD-10-CM | POA: Diagnosis not present

## 2015-02-05 ENCOUNTER — Telehealth: Payer: Self-pay | Admitting: Family Medicine

## 2015-02-05 MED ORDER — VITAMIN D (ERGOCALCIFEROL) 1.25 MG (50000 UNIT) PO CAPS: ORAL_CAPSULE | ORAL | Status: DC

## 2015-02-05 NOTE — Telephone Encounter (Signed)
Rx sent to pharmacy   

## 2015-02-05 NOTE — Telephone Encounter (Signed)
Pt request refill Vitamin D, Ergocalciferol, (DRISDOL) 50000 UNITS CAPS capsule  90 day [facility] [address]

## 2015-02-18 ENCOUNTER — Other Ambulatory Visit: Payer: Self-pay | Admitting: Family Medicine

## 2015-02-18 DIAGNOSIS — R0902 Hypoxemia: Secondary | ICD-10-CM

## 2015-03-11 ENCOUNTER — Telehealth: Payer: Self-pay | Admitting: Family Medicine

## 2015-03-11 NOTE — Telephone Encounter (Signed)
Pt would like to know if you could refer him to [facility] instead of  pulmonary (dr Elsworth Soho)? Is there a particular reason pt needs to see this dr?  Kathleen Argue can see him by April 1 for sleep test. pls advise

## 2015-03-11 NOTE — Telephone Encounter (Signed)
OK to set him up with [facility].  They both do excellent job evaluating OSA

## 2015-03-12 ENCOUNTER — Encounter: Payer: Self-pay | Admitting: Family Medicine

## 2015-03-12 ENCOUNTER — Other Ambulatory Visit: Payer: Self-pay | Admitting: Family Medicine

## 2015-03-12 DIAGNOSIS — G4733 Obstructive sleep apnea (adult) (pediatric): Secondary | ICD-10-CM

## 2015-03-12 DIAGNOSIS — F332 Major depressive disorder, recurrent severe without psychotic features: Secondary | ICD-10-CM | POA: Diagnosis not present

## 2015-03-12 NOTE — Telephone Encounter (Signed)
Referral is ordered

## 2015-04-06 DIAGNOSIS — F332 Major depressive disorder, recurrent severe without psychotic features: Secondary | ICD-10-CM | POA: Diagnosis not present

## 2015-04-08 ENCOUNTER — Encounter: Payer: Self-pay | Admitting: Pulmonary Disease

## 2015-04-08 ENCOUNTER — Ambulatory Visit (INDEPENDENT_AMBULATORY_CARE_PROVIDER_SITE_OTHER): Payer: Medicare Other | Admitting: Pulmonary Disease

## 2015-04-08 VITALS — BP 120/78 | HR 82 | Ht 74.5 in | Wt 217.8 lb

## 2015-04-08 DIAGNOSIS — G4733 Obstructive sleep apnea (adult) (pediatric): Secondary | ICD-10-CM

## 2015-04-08 NOTE — Patient Instructions (Signed)
Check overnight oximetry I will review results of sleep study from [place] [facility] & advise if different recommendations Check out zzoma.com for positional device Decrease dose of klonopin gradually If no better, can reassess in 3 months

## 2015-04-08 NOTE — Progress Notes (Signed)
Subjective:    Patient ID: Jason [name], male    DOB: February 13, 1941, 74 y.o.   MRN: [id number]  HPI  74 year old presents for evaluation of abnormal sleep study. He had a bout of depression in his 32s requiring ECT. 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 He is maintained on clonazepam, olanzapine, lithium by psychiatry. He reports feeling bad every morning. Wife has noted loud snoring especially on his back. She sleeps in a different room. He reports episodes of acting out on the sleep years ago-this has not recurred recently. Epworth sleepiness score is 5, but wife feels that he is underreporting. Bedtime is as late as midnight, sleep latency about 30 minutes to an hour, he sleeps on his right side with one pillow, denies nocturnal awakenings and stays in bed up to 10 AM, wakes up feeling tired but improves as the day goes by, denies dryness of mouth or headaches. He has lost about 20 pounds in the past 2 years.  Home sleep study showed AHI of 27 events per hour ( per patient report) Surprisingly, attended PSG showed AHI of 1 per hour, total sleep time was decreased. No REM or supine sleep was noted. There was 8 minutes of desaturation noted    Past Medical History  Diagnosis Date  . Routine general medical examination at a health care facility   . Pneumonia, organism unspecified   . Atrial fibrillation   . Pure hypercholesterolemia   . Osteoarthrosis, unspecified whether generalized or localized, unspecified site   . Gout, unspecified   . Unspecified nonpsychotic mental disorder   . Colon polyp   . Anxiety   . Depression   . Hypertension    Past Surgical History  Procedure Laterality Date  . Neg hx      No Known Allergies  History   Social History  . Marital Status: Married    Spouse Name: [name]  . Number of Children: 0  . Years of Education: N/A   Occupational History  . retired    Social History Main Topics   . Smoking status: Never Smoker   . Smokeless tobacco: Never Used  . Alcohol Use: No  . Drug Use: No  . Sexual Activity: Not on file   Other Topics Concern  . Not on file   Social History Narrative    Family History  Problem Relation Age of Onset  . Dementia Mother   . Pneumonia Father   . Heart failure Father   . Arthritis Other   . Colon cancer Neg Hx       Review of Systems neg for any significant sore throat, dysphagia, itching, sneezing, nasal congestion or excess/ purulent secretions, fever, chills, sweats, unintended wt loss, pleuritic or exertional cp, hempoptysis, orthopnea pnd or change in chronic leg swelling. Also denies presyncope, palpitations, heartburn, abdominal pain, nausea, vomiting, diarrhea or change in bowel or urinary habits, dysuria,hematuria, rash, arthralgias, visual complaints, headache, numbness weakness or ataxia.     Objective:   Physical Exam  Gen. Pleasant, well-nourished, in no distress, normal affect ENT - no lesions, no post nasal drip Neck: No JVD, no thyromegaly, no carotid bruits Lungs: no use of accessory muscles, no dullness to percussion, clear without rales or rhonchi  Cardiovascular: Rhythm regular, heart sounds  normal, no murmurs or gallops, no peripheral edema Abdomen: soft and non-tender, no hepatosplenomegaly, BS normal. Musculoskeletal: No deformities, no cyanosis or clubbing Neuro:  alert, non  focal       Assessment & Plan:

## 2015-04-08 NOTE — Assessment & Plan Note (Addendum)
His symptoms of excessive daytime somnolence could be related to sleep disorder breathing, but could also simply be related to depression and psychiatric meds, especially clonazepam. It is difficult to explain the discrepancy between the home and the attended study. This may be due to lack of REM or supine sleep during the attendant study i.e. He may have positional sleep apnea. Or simply may be a false-positive reading from the home sleep study.  Check overnight oximetry to confirm that desaturation noted. I doubt need for oxygen since he does not have any cardio pulmonary comorbidity. I will review results of sleep study from [place] [facility] & advise if different recommendations Check out zzoma.com for positional device Decrease dose of klonopin gradually If no better, can reassess in 3 months

## 2015-04-09 ENCOUNTER — Telehealth: Payer: Self-pay | Admitting: Pulmonary Disease

## 2015-04-09 NOTE — Telephone Encounter (Signed)
Called and spoke to [place]. Informed her the ONO is on RA. Melissa verbalized understanding and denied any further questions or concerns at this time.

## 2015-04-10 DIAGNOSIS — G4733 Obstructive sleep apnea (adult) (pediatric): Secondary | ICD-10-CM | POA: Diagnosis not present

## 2015-04-13 DIAGNOSIS — F3341 Major depressive disorder, recurrent, in partial remission: Secondary | ICD-10-CM | POA: Diagnosis not present

## 2015-04-16 ENCOUNTER — Encounter: Payer: Self-pay | Admitting: Family Medicine

## 2015-04-16 ENCOUNTER — Ambulatory Visit (INDEPENDENT_AMBULATORY_CARE_PROVIDER_SITE_OTHER): Payer: Medicare Other | Admitting: Family Medicine

## 2015-04-16 VITALS — BP 118/80 | HR 91 | Temp 98.9°F | Ht 74.5 in | Wt 217.8 lb

## 2015-04-16 DIAGNOSIS — B029 Zoster without complications: Secondary | ICD-10-CM

## 2015-04-16 DIAGNOSIS — B0230 Zoster ocular disease, unspecified: Secondary | ICD-10-CM | POA: Diagnosis not present

## 2015-04-16 MED ORDER — TRAMADOL HCL 50 MG PO TABS: 50.0000 mg | ORAL_TABLET | Freq: Three times a day (TID) | ORAL | Status: DC | PRN

## 2015-04-16 NOTE — Patient Instructions (Signed)
Shingles  Try tylenol 500-1000mg  up to 3 times daily or Aleve 1-2 tablets up to 2 times per day  Follow up if this is not adequate for your pain  Follow up with your eye doctor as planned     Shingles (herpes zoster) is an infection that is caused by the same virus that causes chickenpox (varicella). The infection causes a painful skin rash and fluid-filled blisters, which eventually break open, crust over, and heal. It may occur in any area of the body, but it usually affects only one side of the body or face. The pain of shingles usually lasts about 1 month. However, some people with shingles may develop long-term (chronic) pain in the affected area of the body. Shingles often occurs many years after the person had chickenpox. It is more common:  In people older than 50 years.  In people with weakened immune systems, such as those with HIV, AIDS, or cancer.  In people taking medicines that weaken the immune system, such as transplant medicines.  In people under great stress. CAUSES  Shingles is caused by the varicella zoster virus (VZV), which also causes chickenpox. After a person is infected with the virus, it can remain in the person's body for years in an inactive state (dormant). To cause shingles, the virus reactivates and breaks out as an infection in a nerve root. The virus can be spread from person to person (contagious) through contact with open blisters of the shingles rash. It will only spread to people who have not had chickenpox. When these people are exposed to the virus, they may develop chickenpox. They will not develop shingles. Once the blisters scab over, the person is no longer contagious and cannot spread the virus to others. SIGNS AND SYMPTOMS  Shingles shows up in stages. The initial symptoms may be pain, itching, and tingling in an area of the skin. This pain is usually described as burning, stabbing, or throbbing.In a few days or weeks, a painful red rash will  appear in the area where the pain, itching, and tingling were felt. The rash is usually on one side of the body in a band or belt-like pattern. Then, the rash usually turns into fluid-filled blisters. They will scab over and dry up in approximately 2-3 weeks. Flu-like symptoms may also occur with the initial symptoms, the rash, or the blisters. These may include:  Fever.  Chills.  Headache.  Upset stomach. DIAGNOSIS  Your health care provider will perform a skin exam to diagnose shingles. Skin scrapings or fluid samples may also be taken from the blisters. This sample will be examined under a microscope or sent to a lab for further testing. TREATMENT  There is no specific cure for shingles. Your health care provider will likely prescribe medicines to help you manage the pain, recover faster, and avoid long-term problems. This may include antiviral drugs, anti-inflammatory drugs, and pain medicines. HOME CARE INSTRUCTIONS   Take a cool bath or apply cool compresses to the area of the rash or blisters as directed. This may help with the pain and itching.   Take medicines only as directed by your health care provider.   Rest as directed by your health care provider.  Keep your rash and blisters clean with mild soap and cool water or as directed by your health care provider.  Do not pick your blisters or scratch your rash. Apply an anti-itch cream or numbing creams to the affected area as directed by your health care  provider.  Keep your shingles rash covered with a loose bandage (dressing).  Avoid skin contact with:  Babies.   Pregnant women.   Children with eczema.   Elderly people with transplants.   People with chronic illnesses, such as leukemia or AIDS.   Wear loose-fitting clothing to help ease the pain of material rubbing against the rash.  Keep all follow-up visits as directed by your health care provider.If the area involved is on your face, you may receive a  referral for a specialist, such as an eye doctor (ophthalmologist) or an ear, nose, and throat (ENT) doctor. Keeping all follow-up visits will help you avoid eye problems, chronic pain, or disability.  SEEK IMMEDIATE MEDICAL CARE IF:   You have facial pain, pain around the eye area, or loss of feeling on one side of your face.  You have ear pain or ringing in your ear.  You have loss of taste.  Your pain is not relieved with prescribed medicines.   Your redness or swelling spreads.   You have more pain and swelling.  Your condition is worsening or has changed.   You have a fever. MAKE SURE YOU:  Understand these instructions.  Will watch your condition.  Will get help right away if you are not doing well or get worse. Document Released: 12/12/2005 Document Revised: 04/28/2014 Document Reviewed: 07/26/2012 [employer] Patient Information 2015 [place], [place]. This information is not intended to replace advice given to you by your health care provider. Make sure you discuss any questions you have with your health care provider.

## 2015-04-16 NOTE — Progress Notes (Signed)
HPI:  Shingles: -rash on L side of nose and cheek  -he had gone to his eye doctor today for pain and redness of the L eye and diagnosed with shingles  -he was put on famciclovir and eye drops but was told to follow up with his PCP for pain control -denies: HA, fevers, vomiting, malaise -he has tried ibuprofen for pain which helped the pain  ROS: See pertinent positives and negatives per HPI.  Past Medical History  Diagnosis Date  . Routine general medical examination at a health care facility   . Pneumonia, organism unspecified   . Atrial fibrillation   . Pure hypercholesterolemia   . Osteoarthrosis, unspecified whether generalized or localized, unspecified site   . Gout, unspecified   . Unspecified nonpsychotic mental disorder   . Colon polyp   . Anxiety   . Depression   . Hypertension     Past Surgical History  Procedure Laterality Date  . Neg hx      Family History  Problem Relation Age of Onset  . Dementia Mother   . Pneumonia Father   . Heart failure Father   . Arthritis Other   . Colon cancer Neg Hx     History   Social History  . Marital Status: Married    Spouse Name: [name]  . Number of Children: 0  . Years of Education: N/A   Occupational History  . retired    Social History Main Topics  . Smoking status: Never Smoker   . Smokeless tobacco: Never Used  . Alcohol Use: No  . Drug Use: No  . Sexual Activity: Not on file   Other Topics Concern  . None   Social History Narrative     Current outpatient prescriptions:  .  amLODipine (NORVASC) 5 MG tablet, Take 1 tablet (5 mg total) by mouth daily., Disp: 90 tablet, Rfl: 2 .  aspirin 81 MG tablet, Take 81 mg by mouth daily.  , Disp: , Rfl:  .  clonazePAM (KLONOPIN) 1 MG tablet, TAKE ONE AND A HALF TABLET BY MOUTH 2 TIMES DAILY. PER DR. COTTLE, Disp: , Rfl:  .  Coenzyme Q10 (COQ10) 100 MG CAPS, Take 1 capsule by mouth daily., Disp: , Rfl:  .  famciclovir (FAMVIR) 500 MG tablet, Take by mouth 3  (three) times daily., Disp: , Rfl:  .  lithium carbonate (ESKALITH) 450 MG CR tablet, Take 450 mg by mouth. 11/2 tablets daily, Disp: , Rfl:  .  meloxicam (MOBIC) 7.5 MG tablet, Take 7.5 mg by mouth as needed. , Disp: , Rfl:  .  Multiple Vitamins-Minerals (MULTIVITAMIN & MINERAL PO), Take 1 tablet by mouth daily, Disp: , Rfl:  .  neomycin-polymyxin-dexameth (MAXITROL) 0.1 % OINT, Place 1 application into the left eye 4 (four) times daily., Disp: , Rfl:  .  OLANZapine (ZYPREXA) 7.5 MG tablet, Take 7.5 mg by mouth at bedtime., Disp: , Rfl: 1 .  Omega-3 Krill Oil 500 MG CAPS, Take 1 capsule by mouth daily., Disp: , Rfl:  .  Probiotic Product (PROBIOTIC DAILY PO), Take 1 tablet by mouth every other day., Disp: , Rfl:  .  sertraline (ZOLOFT) 50 MG tablet, Take 50 mg by mouth daily. Takes one and one half tablet daily, Disp: , Rfl:  .  simvastatin (ZOCOR) 20 MG tablet, Take 1 tablet (20 mg total) by mouth daily., Disp: 90 tablet, Rfl: 2 .  Vitamin D, Ergocalciferol, (DRISDOL) 50000 UNITS CAPS capsule, TAKE 1 CAPSULE EVERY 7 DAYS,  Disp: 90 capsule, Rfl: 0  EXAM:  Filed Vitals:   04/16/15 1547  BP: 118/80  Pulse: 91  Temp: 98.9 F (37.2 C)    Body mass index is 27.6 kg/(m^2).  GENERAL: vitals reviewed and listed above, alert, oriented, appears well hydrated and in no acute distress  HEENT: atraumatic, PERRLA, conjunttiva red with clear rhinorrhea, no obvious abnormalities on inspection of external nose and ears  NECK: no obvious masses on inspection  SKIN: very small patch of vesicular rash on L side of nose and cheek, a few erythematous small papules on the L forehead  MS: moves all extremities without noticeable abnormality  PSYCH: pleasant and cooperative, no obvious depression or anxiety  ASSESSMENT AND PLAN:  Discussed the following assessment and plan:  Shingles - Plan: DISCONTINUED: traMADol (ULTRAM) 50 MG tablet  -he has follow up with his opthomologist - and will follow up  sooner if any worsening eye symptoms -has started the antiviral thereapy - advised to complete 7 day course -discussed course and complications of shingles -he is going to try tyenol and aleve for pain and follow up if this is not adequate -Patient advised to return or notify a doctor immediately if symptoms worsen or persist or new concerns arise.  There are no Patient Instructions on file for this visit.   Colin Benton R.

## 2015-04-16 NOTE — Progress Notes (Signed)
Pre visit review using our clinic review tool, if applicable. No additional management support is needed unless otherwise documented below in the visit note. 

## 2015-04-17 ENCOUNTER — Telehealth: Payer: Self-pay | Admitting: Pulmonary Disease

## 2015-04-17 NOTE — Telephone Encounter (Signed)
Have reviewed his apnea link & PSG from [place] [facility]. Also his ONO on RA -feel that he does have OSA PSG underestimated since he only slept for 2h! ONO also shows sawtooth desatns s/o OSA He needs CPAP titration study, if willing to use CPAP at home

## 2015-04-20 NOTE — Telephone Encounter (Signed)
Lmtcb.

## 2015-04-20 NOTE — Telephone Encounter (Signed)
Pt wife returned call  [phone]

## 2015-04-20 NOTE — Telephone Encounter (Signed)
ok 

## 2015-04-20 NOTE — Telephone Encounter (Signed)
Spoke with pt's wife and advised of Dr Bari Mantis recommendations.  Wife states that pt currently has shingles on his nose and forehead and she will have to call us back when he would be able to complete cpap titration.  Sending to Dr Elsworth Soho as an Juluis Rainier

## 2015-04-23 DIAGNOSIS — B0230 Zoster ocular disease, unspecified: Secondary | ICD-10-CM | POA: Diagnosis not present

## 2015-05-04 DIAGNOSIS — B0230 Zoster ocular disease, unspecified: Secondary | ICD-10-CM | POA: Diagnosis not present

## 2015-05-06 DIAGNOSIS — B0230 Zoster ocular disease, unspecified: Secondary | ICD-10-CM | POA: Diagnosis not present

## 2015-05-08 DIAGNOSIS — B0052 Herpesviral keratitis: Secondary | ICD-10-CM | POA: Diagnosis not present

## 2015-05-12 ENCOUNTER — Encounter: Payer: Self-pay | Admitting: Pulmonary Disease

## 2015-05-13 DIAGNOSIS — B0230 Zoster ocular disease, unspecified: Secondary | ICD-10-CM | POA: Diagnosis not present

## 2015-05-15 DIAGNOSIS — F332 Major depressive disorder, recurrent severe without psychotic features: Secondary | ICD-10-CM | POA: Diagnosis not present

## 2015-05-20 DIAGNOSIS — B0230 Zoster ocular disease, unspecified: Secondary | ICD-10-CM | POA: Diagnosis not present

## 2015-05-22 ENCOUNTER — Telehealth: Payer: Self-pay | Admitting: Pulmonary Disease

## 2015-05-22 DIAGNOSIS — G4733 Obstructive sleep apnea (adult) (pediatric): Secondary | ICD-10-CM

## 2015-05-22 NOTE — Telephone Encounter (Signed)
Spoke with patient and pt's wife (on speaker phone), Patient has recovered from Shingles and is ready for the CPAP Titration study.  Ordered CPAP Titration study.  Nothing further needed.

## 2015-05-29 ENCOUNTER — Telehealth: Payer: Self-pay | Admitting: Pulmonary Disease

## 2015-05-29 NOTE — Telephone Encounter (Signed)
Patient received call from [place] [facility], was scheduled for 8/11 and placed on cancellation list.  Patients wife wanted to know how much the CPAP Titration study would cost.  I advised her that I am not sure about the cost and gave her the number for the Sleep [facility] and told her to try to call them to see if they knew who could give her that information.  She said she would call them and billing dept to see if they have any idea how much it would cost.  Nothing further needed.

## 2015-06-10 DIAGNOSIS — B0230 Zoster ocular disease, unspecified: Secondary | ICD-10-CM | POA: Diagnosis not present

## 2015-06-22 DIAGNOSIS — H00015 Hordeolum externum left lower eyelid: Secondary | ICD-10-CM | POA: Diagnosis not present

## 2015-07-03 DIAGNOSIS — F332 Major depressive disorder, recurrent severe without psychotic features: Secondary | ICD-10-CM | POA: Diagnosis not present

## 2015-07-06 ENCOUNTER — Ambulatory Visit (HOSPITAL_BASED_OUTPATIENT_CLINIC_OR_DEPARTMENT_OTHER): Payer: Medicare Other | Attending: Pulmonary Disease

## 2015-07-06 DIAGNOSIS — I493 Ventricular premature depolarization: Secondary | ICD-10-CM | POA: Insufficient documentation

## 2015-07-06 DIAGNOSIS — R0683 Snoring: Secondary | ICD-10-CM | POA: Diagnosis not present

## 2015-07-06 DIAGNOSIS — G4733 Obstructive sleep apnea (adult) (pediatric): Secondary | ICD-10-CM | POA: Insufficient documentation

## 2015-07-06 DIAGNOSIS — G4730 Sleep apnea, unspecified: Secondary | ICD-10-CM | POA: Diagnosis present

## 2015-07-07 ENCOUNTER — Other Ambulatory Visit: Payer: Self-pay | Admitting: Family Medicine

## 2015-07-20 ENCOUNTER — Encounter: Payer: Self-pay | Admitting: Family Medicine

## 2015-07-20 ENCOUNTER — Ambulatory Visit (INDEPENDENT_AMBULATORY_CARE_PROVIDER_SITE_OTHER): Payer: Medicare Other | Admitting: Family Medicine

## 2015-07-20 VITALS — BP 120/74 | HR 74 | Temp 98.0°F | Wt 206.0 lb

## 2015-07-20 DIAGNOSIS — I10 Essential (primary) hypertension: Secondary | ICD-10-CM | POA: Diagnosis not present

## 2015-07-20 DIAGNOSIS — E7800 Pure hypercholesterolemia, unspecified: Secondary | ICD-10-CM

## 2015-07-20 DIAGNOSIS — Z23 Encounter for immunization: Secondary | ICD-10-CM | POA: Diagnosis not present

## 2015-07-20 DIAGNOSIS — B029 Zoster without complications: Secondary | ICD-10-CM

## 2015-07-20 MED ORDER — AMLODIPINE BESYLATE 5 MG PO TABS: 5.0000 mg | ORAL_TABLET | Freq: Every day | ORAL | Status: DC

## 2015-07-20 NOTE — Progress Notes (Signed)
Pre visit review using our clinic review tool, if applicable. No additional management support is needed unless otherwise documented below in the visit note. 

## 2015-07-20 NOTE — Addendum Note (Signed)
Addended by: Marcina Millard on: 07/20/2015 01:54 PM   Modules accepted: Orders

## 2015-07-20 NOTE — Progress Notes (Signed)
   Subjective:    Patient ID: Jason [name], male    DOB: September 24, 1941, 74 y.o.   MRN: [id number]  HPI Medical follow-up  Hypertension. Patient on amlodipine and blood pressure stable. No peripheral edema. No recent headaches. He does occasionally feel "off balance "but no falls. No syncopal or presyncopal symptoms. Generally very sedentary  Episode of shingles involving left side of face several months ago. No visual deficits. Followed closely by ophthalmology. No recurrent pain  History of documented Prevnar 13 but not Pneumovax. He has obstructive sleep apnea followed by pulmonary.  Hyperlipidemia treated with simvastatin. Denies any myalgias. Lipids are checked last visit and stable Patient has lost some weight due to his efforts. Good appetite  Past Medical History  Diagnosis Date  . Routine general medical examination at a health care facility   . Pneumonia, organism unspecified   . Atrial fibrillation   . Pure hypercholesterolemia   . Osteoarthrosis, unspecified whether generalized or localized, unspecified site   . Gout, unspecified   . Unspecified nonpsychotic mental disorder   . Colon polyp   . Anxiety   . Depression   . Hypertension    Past Surgical History  Procedure Laterality Date  . Neg hx      reports that he has never smoked. He has never used smokeless tobacco. He reports that he does not drink alcohol or use illicit drugs. family history includes Arthritis in his other; Dementia in his mother; Heart failure in his father; Pneumonia in his father. There is no history of Colon cancer. No Known Allergies    Review of Systems  Constitutional: Negative for fatigue and unexpected weight change.  Eyes: Negative for visual disturbance.  Respiratory: Negative for cough, chest tightness and shortness of breath.   Cardiovascular: Negative for chest pain, palpitations and leg swelling.  Endocrine: Negative for polydipsia and polyuria.  Genitourinary: Negative for  dysuria.  Neurological: Negative for dizziness, syncope, weakness, light-headedness and headaches.       Objective:   Physical Exam  Constitutional: He appears well-developed and well-nourished.  Neck: Neck supple. No JVD present. No thyromegaly present.  Cardiovascular: Normal rate and regular rhythm.  Exam reveals no gallop.   Pulmonary/Chest: Effort normal and breath sounds normal. No respiratory distress. He has no wheezes. He has no rales.  Musculoskeletal: He exhibits no edema.  Neurological: He is alert.          Assessment & Plan:  #1 hypertension. Stable and at goal. Continue amlodipine #2 recent shingles involving left side of face. Recovered with no residual deficits.  We did discuss getting shingles vaccine at some point this year and he would like to consider this at follow-up in 6 months #3 health maintenance. No history of recorded 23 valent Pneumovax and this will be given today. #4 hyperlipidemia. Continue simvastatin. Recheck lipids at follow-up in 6 months

## 2015-07-21 ENCOUNTER — Telehealth: Payer: Self-pay | Admitting: Pulmonary Disease

## 2015-07-21 NOTE — Telephone Encounter (Signed)
Spoke with Coralyn Mark at sleep lab, states that his sleep test has not yet been scored, but once it is scored it will be automatically sent to RA's inbasket to be read. Forwarding to RA to make aware.

## 2015-07-21 NOTE — Telephone Encounter (Signed)
Pt is requesting sleep study from 07/06/15. Please advise RA thanks

## 2015-07-21 NOTE — Telephone Encounter (Signed)
Pl ask sleep lab to route to my box to read

## 2015-07-23 ENCOUNTER — Telehealth: Payer: Self-pay | Admitting: Pulmonary Disease

## 2015-07-23 NOTE — Telephone Encounter (Signed)
Patients wife calling to get results from CPAP Titration study, see TE 07/21/15 Advised that we will contact them as soon as we have results. Nothing further needed.

## 2015-07-27 ENCOUNTER — Ambulatory Visit (HOSPITAL_BASED_OUTPATIENT_CLINIC_OR_DEPARTMENT_OTHER): Payer: Medicare Other | Admitting: Pulmonary Disease

## 2015-07-27 DIAGNOSIS — G4733 Obstructive sleep apnea (adult) (pediatric): Secondary | ICD-10-CM | POA: Diagnosis not present

## 2015-07-27 NOTE — Telephone Encounter (Signed)
CPAP did not correct events Schedule OV to discuss options

## 2015-07-27 NOTE — Progress Notes (Signed)
Patient Name: Jason [name], Jason [name] Date: 07/06/2015 Gender: Male D.O.B: 03-31-1941 Age (years): 79 Referring Provider: Kara Mead MD, ABSM Height (inches): 75 Interpreting Physician: Kara Mead MD, ABSM Weight (lbs): 204 RPSGT: Madelon Lips BMI: 25 MRN: [id number] Neck Size: 17.50  CLINICAL INFORMATION The patient is referred for a CPAP titration to treat sleep apnea.  Date of NPSG, Split Night or OZH:YQMV sleep study showed AHI of 27 events per hour ( per patient report) Surprisingly, attended PSG showed AHI of 1 per hour, total sleep time was decreased. No REM or supine sleep was noted. There was 8 minutes of desaturation noted. PSG underestimated since he only slept for 2h! ONO also shows sawtooth desatns s/o OSA  SLEEP STUDY TECHNIQUE As per the AASM Manual for the Scoring of Sleep and Associated Events v2.3 (April 2016) with a hypopnea requiring 4% desaturations.  The channels recorded and monitored were frontal, central and occipital EEG, electrooculogram (EOG), submentalis EMG (chin), nasal and oral airflow, thoracic and abdominal wall motion, anterior tibialis EMG, snore microphone, electrocardiogram, and pulse oximetry. Continuous positive airway pressure (CPAP) was initiated at the beginning of the study and titrated to treat sleep-disordered breathing.  MEDICATIONS Medications administered by patient during sleep study : No sleep medicine administered.  TECHNICIAN COMMENTS Comments added by technician: Patient had more than two awakenings to use the bathroom. Patient was restless all through the night.    RESPIRATORY PARAMETERS Optimal PAP Pressure (cm):  AHI at Optimal Pressure (/hr): N/A Overall Minimal O2 (%): 89.00 Supine % at Optimal Pressure (%): N/A Minimal O2 at Optimal Pressure (%): 89.00    SLEEP ARCHITECTURE The study was initiated at 11:07:15 PM and ended at 5:11:56 AM.  Sleep onset time was 6.1 minutes and the sleep efficiency was 69.5%. The total sleep  time was 253.6 minutes.  The patient spent 47.12% of the night in stage N1 sleep, 50.71% in stage N2 sleep, 0.00% in stage N3 and 2.17% in REM.Stage REM latency was 117.5 minutes  Wake after sleep onset was 105.0. Alpha intrusion was absent. Supine sleep was 92.86%.  CARDIAC DATA The 2 lead EKG demonstrated sinus rhythm. The mean heart rate was 65.61 beats per minute. Other EKG findings include: PVCs.  LEG MOVEMENT DATA The total Periodic Limb Movements of Sleep (PLMS) were 164. The PLMS index was 38.80. A PLMS index of <15 is considered normal in adults.  IMPRESSIONS An optimal PAP pressure could not be selected for this patient based on the available study data. Moderate Central Sleep Apnea was noted during this titration (CAI = 20.3/h). Mild oxygen desaturations were observed during this titration (min O2 = 89.00%). The patient snored with Soft snoring volume during this titration study. 2-lead EKG demonstrated: PVCs Moderate periodic limb movements were observed during this study. Arousals associated with PLMs were rare.  DIAGNOSIS Obstructive Sleep Apnea (327.23 [G47.33 ICD-10]) Central apneas emerged during CPAP titration raising possibility of complex sleep apnea  RECOMMENDATIONS Recommend BiPAP or ASV titration study. Patient did not tolerate CPAP. Avoid alcohol, sedatives and other CNS depressants that may worsen sleep apnea and disrupt normal sleep architecture. Sleep hygiene should be reviewed to assess factors that may improve sleep quality. Weight management and regular exercise should be initiated or continued.   Kara Mead MD. [facility].  Pulmonary & Critical care

## 2015-07-27 NOTE — Addendum Note (Signed)
Addended by: Kara Mead V on: 07/27/2015 12:15 PM   Modules accepted: Level of Service

## 2015-07-27 NOTE — Telephone Encounter (Signed)
lmomtcb x1 

## 2015-07-28 NOTE — Telephone Encounter (Signed)
Patient notified. Scheduled to see TP 8/22 to discuss treatment options. Nothing further needed.

## 2015-07-30 ENCOUNTER — Encounter: Payer: Self-pay | Admitting: Pulmonary Disease

## 2015-08-02 ENCOUNTER — Other Ambulatory Visit: Payer: Self-pay | Admitting: Family Medicine

## 2015-08-06 ENCOUNTER — Ambulatory Visit (HOSPITAL_BASED_OUTPATIENT_CLINIC_OR_DEPARTMENT_OTHER): Payer: Self-pay

## 2015-08-17 ENCOUNTER — Ambulatory Visit (INDEPENDENT_AMBULATORY_CARE_PROVIDER_SITE_OTHER): Payer: Medicare Other | Admitting: Adult Health

## 2015-08-17 ENCOUNTER — Encounter: Payer: Self-pay | Admitting: Adult Health

## 2015-08-17 VITALS — BP 102/60 | HR 71 | Temp 98.5°F | Ht 74.0 in | Wt 207.0 lb

## 2015-08-17 DIAGNOSIS — F329 Major depressive disorder, single episode, unspecified: Secondary | ICD-10-CM | POA: Diagnosis not present

## 2015-08-17 DIAGNOSIS — F32A Depression, unspecified: Secondary | ICD-10-CM

## 2015-08-17 DIAGNOSIS — G4733 Obstructive sleep apnea (adult) (pediatric): Secondary | ICD-10-CM

## 2015-08-17 NOTE — Patient Instructions (Addendum)
I will call this afternoon after I speak with Dr. Elsworth Soho  Regarding Sleep apnea treatment.    Late Add :  Discuss this case in detail with Dr. Elsworth Soho  With review of Titration study .  Would like to start pt on CPAP on auto-titration with close follow up in 6 weeks with download  With hope that central will decrease with time on CPAP .  Can consider at follow up if central not decreased -consider BIPAP vs ASV titration study .

## 2015-08-18 ENCOUNTER — Other Ambulatory Visit: Payer: Self-pay | Admitting: Adult Health

## 2015-08-18 ENCOUNTER — Encounter: Payer: Self-pay | Admitting: Adult Health

## 2015-08-18 DIAGNOSIS — F32A Depression, unspecified: Secondary | ICD-10-CM | POA: Insufficient documentation

## 2015-08-18 DIAGNOSIS — F329 Major depressive disorder, single episode, unspecified: Secondary | ICD-10-CM | POA: Insufficient documentation

## 2015-08-18 DIAGNOSIS — F39 Unspecified mood [affective] disorder: Secondary | ICD-10-CM | POA: Insufficient documentation

## 2015-08-18 DIAGNOSIS — G4733 Obstructive sleep apnea (adult) (pediatric): Secondary | ICD-10-CM

## 2015-08-18 NOTE — Telephone Encounter (Signed)
Jason [name] - Patient wants to know if you know anything about the CPAP Pro... [email or web address]  He would like you to check the website for the CPAP pro.

## 2015-08-18 NOTE — Progress Notes (Signed)
Reviewed & agree with plan  

## 2015-08-18 NOTE — Assessment & Plan Note (Signed)
Pt with significant difficult to control depression on multiple medication  Hopefully correcting underlying sleep disorder may help with mood as well .  Medications may be affecting REM sleep as well.

## 2015-08-18 NOTE — Progress Notes (Signed)
   Subjective:    Patient ID: Jason [name], male    DOB: 1941-02-17, 74 y.o.   MRN: [id number]  HPI 74 year old seen for sleep consult 04/08/2015  He had a bout of depression in his 71s requiring ECT. 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 He is maintained on clonazepam, olanzapine, lithium by psychiatry.     08/17/15 Follow up : OSA /CPAP titration study  Pt was seen few months ago for sleep consult  He has chronic depression f/by Psychiatry on multiple medication  Has significant daytime sleepiness.  HST showed AHI 27/hr . However attended PSG AHI 1/h (felt to underestimated since he only slept for 2hr)  He was set up for a CPAP titration study .  Underwent CPAP titration on 07/06/15 that showed moderate Central Sleep Apnea with (CAI 20.3/hr )  And mild desats with O2 sat 89%. Moderate PLM but arousals were rare.  Felt he could have a complex sleep apnea since central apneas emerged during CPAP titration .  Discussed these results with pt and wife .  Pt says not matter how much he sleeps he still feels tired throughout day.  Does have some snoring per wife.  He has difficult to control depression , hx of A Fib , HTN . No known hx of CVA or CHF     Review of Systems Constitutional:   No  weight loss, night sweats,  Fevers, chills,  +fatigue, or  lassitude.  HEENT:   No headaches,  Difficulty swallowing,  Tooth/dental problems, or  Sore throat,                No sneezing, itching, ear ache, nasal congestion, post nasal drip,   CV:  No chest pain,  Orthopnea, PND, swelling in lower extremities, anasarca, dizziness, palpitations, syncope.   GI  No heartburn, indigestion, abdominal pain, nausea, vomiting, diarrhea, change in bowel habits, loss of appetite, bloody stools.   Resp: No shortness of breath with exertion or at rest.  No excess mucus, no productive cough,  No non-productive cough,  No coughing up of blood.  No change in  color of mucus.  No wheezing.  No chest wall deformity  Skin: no rash or lesions.  GU: no dysuria, change in color of urine, no urgency or frequency.  No flank pain, no hematuria   MS:  No joint pain or swelling.  No decreased range of motion.  No back pain.  Psych:  +chronic depression          Objective:   Physical Exam GEN: A/Ox3; depressed affect  , NAD, tall   HEENT:  [place]/AT,  EACs-clear, TMs-wnl, NOSE-clear, THROAT-clear, no lesions, no postnasal drip or exudate noted. , class 2 airway   NECK:  Supple w/ fair ROM; no JVD; normal carotid impulses w/o bruits; no thyromegaly or nodules palpated; no lymphadenopathy.  RESP  Clear  P & A; w/o, wheezes/ rales/ or rhonchi.no accessory muscle use, no dullness to percussion  CARD:  RRR, no m/r/g  , no peripheral edema, pulses intact, no cyanosis or clubbing.  GI:   Soft & nt; nml bowel sounds; no organomegaly or masses detected.  Musco: Warm bil, no deformities or joint swelling noted.   Neuro: alert, no focal deficits noted.    Skin: Warm, no lesions or rashes         Assessment & Plan:

## 2015-08-18 NOTE — Assessment & Plan Note (Signed)
HST with AHI of 27/hr , CPAP titration study showed Moderate Central Sleep Apnea during titration study  Raising the possibility of complex sleep apnea.  Discuss this case in detail with Dr. Elsworth Soho  With review of Titration study .  Would like to start pt on CPAP on auto-titration with close follow up in 6 weeks with download  With hope that central will decrease with time on CPAP .  Can consider at follow up if central not decreased -consider BIPAP vs ASV titration study .

## 2015-08-24 DIAGNOSIS — H0015 Chalazion left lower eyelid: Secondary | ICD-10-CM | POA: Diagnosis not present

## 2015-08-24 DIAGNOSIS — H0014 Chalazion left upper eyelid: Secondary | ICD-10-CM | POA: Diagnosis not present

## 2015-09-21 DIAGNOSIS — H2012 Chronic iridocyclitis, left eye: Secondary | ICD-10-CM | POA: Diagnosis not present

## 2015-10-01 DIAGNOSIS — F332 Major depressive disorder, recurrent severe without psychotic features: Secondary | ICD-10-CM | POA: Diagnosis not present

## 2015-10-05 DIAGNOSIS — F3341 Major depressive disorder, recurrent, in partial remission: Secondary | ICD-10-CM | POA: Diagnosis not present

## 2015-10-12 ENCOUNTER — Telehealth: Payer: Self-pay | Admitting: Pulmonary Disease

## 2015-10-12 ENCOUNTER — Ambulatory Visit (INDEPENDENT_AMBULATORY_CARE_PROVIDER_SITE_OTHER): Payer: Medicare Other | Admitting: Pulmonary Disease

## 2015-10-12 ENCOUNTER — Encounter: Payer: Self-pay | Admitting: Pulmonary Disease

## 2015-10-12 VITALS — BP 118/68 | HR 78 | Ht 74.5 in | Wt 205.4 lb

## 2015-10-12 DIAGNOSIS — F329 Major depressive disorder, single episode, unspecified: Secondary | ICD-10-CM | POA: Diagnosis not present

## 2015-10-12 DIAGNOSIS — G4733 Obstructive sleep apnea (adult) (pediatric): Secondary | ICD-10-CM | POA: Diagnosis not present

## 2015-10-12 DIAGNOSIS — F32A Depression, unspecified: Secondary | ICD-10-CM

## 2015-10-12 NOTE — Patient Instructions (Signed)
You may be getting some benefit from CPAP You are adjusting well FU in 2 months with download to see if centrals have gone away

## 2015-10-12 NOTE — Progress Notes (Signed)
   Subjective:    Patient ID: Jason [name], male    DOB: 09/28/1941, 74 y.o.   MRN: [id number]  HPI  74 year old seen for sleep consult 04/08/2015  He had a bout of depression in his 45s requiring ECT. 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 He is maintained on clonazepam, olanzapine, lithium by psychiatry.    He has persistent tiredness and fatigue in spite of CPAP therapy  10/12/2015  Chief Complaint  Patient presents with  . Follow-up    pt following for OSA. pt states he is doing well. pt using CPAP every night for about 8 hours. Mask and pressure good for pt. no concerns at this time. DME: [facility]    Discussed these results with pt and wife .  Pt says not matter how much he sleeps he still feels tired throughout day.  He has difficult to control depression , hx of A Fib , HTN . No known hx of CVA or CHF   Download  09/2015 on auto 5-15  >> good usage 7.5h Residual AHI 11/h, mostly obstructive, leak + on occasion, AHI fluctuates  Significant tests/ events  Home sleep study showed AHI of 27 events per hour ( per patient report) Surprisingly, attended PSG showed AHI of 1 per hour, total sleep time was decreased. No REM or supine sleep was noted. There was 8 minutes of desaturation noted  CPAP titration on 07/06/15  showed moderate Central Sleep Apnea with (CAI 20.3/hr )  And mild desats with O2 sat 89%. Moderate PLM but arousals were rare.  Felt he could have a complex sleep apnea since central apneas emerged during CPAP titration      Review of Systems neg for any significant sore throat, dysphagia, itching, sneezing, nasal congestion or excess/ purulent secretions, fever, chills, sweats, unintended wt loss, pleuritic or exertional cp, hempoptysis, orthopnea pnd or change in chronic leg swelling. Also denies presyncope, palpitations, heartburn, abdominal pain, nausea, vomiting, diarrhea or change in bowel or urinary  habits, dysuria,hematuria, rash, arthralgias, visual complaints, headache, numbness weakness or ataxia.     Objective:   Physical Exam  Gen. Pleasant, well-nourished, in no distress ENT - no lesions, no post nasal drip Neck: No JVD, no thyromegaly, no carotid bruits Lungs: no use of accessory muscles, no dullness to percussion, clear without rales or rhonchi  Cardiovascular: Rhythm regular, heart sounds  normal, no murmurs or gallops, no peripheral edema Musculoskeletal: No deformities, no cyanosis or clubbing        Assessment & Plan:

## 2015-10-12 NOTE — Telephone Encounter (Signed)
lmtcb x1 to schedule appt with TP

## 2015-10-12 NOTE — Assessment & Plan Note (Addendum)
He has persistent tiredness and fatigue in spite of good compliance with CPAP therapy. Events are not fully controlled-he does have some residual obstructive events on download. Central apneas emerged during titration study suggesting complex OSA  He is definitely getting some benefit from CPAP You are adjusting well FU in 2 months with download to see if centrals have gone away If not, options include bipap with back up or abandoning Rx  Weight loss encouraged, compliance with goal of at least 4-6 hrs every night is the expectation. Advised against medications with sedative side effects Cautioned against driving when sleepy - understanding that sleepiness will vary on a day to day basis

## 2015-10-13 NOTE — Assessment & Plan Note (Signed)
He has decreased doses on clonazepam and Zyprexa

## 2015-10-13 NOTE — Telephone Encounter (Signed)
Called and spoke to pt's wife. Appt made with TP on 12.16.16. Pt's wife verbalized understanding and denied any further questions or concerns at this time.

## 2015-10-15 DIAGNOSIS — B0230 Zoster ocular disease, unspecified: Secondary | ICD-10-CM | POA: Diagnosis not present

## 2015-10-27 ENCOUNTER — Encounter: Payer: Self-pay | Admitting: Pulmonary Disease

## 2015-11-11 ENCOUNTER — Telehealth: Payer: Self-pay | Admitting: Pulmonary Disease

## 2015-11-11 NOTE — Telephone Encounter (Signed)
lmtcb for [employer]

## 2015-11-11 NOTE — Telephone Encounter (Signed)
Claiborne Billings states that nothing further is needed from our office - this has already been handled by Rodena Piety [facility]). Nothing further needed.

## 2015-11-26 DIAGNOSIS — F332 Major depressive disorder, recurrent severe without psychotic features: Secondary | ICD-10-CM | POA: Diagnosis not present

## 2015-11-27 ENCOUNTER — Ambulatory Visit (INDEPENDENT_AMBULATORY_CARE_PROVIDER_SITE_OTHER): Payer: Medicare Other

## 2015-11-27 DIAGNOSIS — Z23 Encounter for immunization: Secondary | ICD-10-CM

## 2015-12-11 ENCOUNTER — Encounter: Payer: Self-pay | Admitting: Adult Health

## 2015-12-11 ENCOUNTER — Ambulatory Visit (INDEPENDENT_AMBULATORY_CARE_PROVIDER_SITE_OTHER): Payer: Medicare Other | Admitting: Adult Health

## 2015-12-11 VITALS — BP 130/72 | HR 75 | Ht 74.5 in | Wt 209.2 lb

## 2015-12-11 DIAGNOSIS — G4733 Obstructive sleep apnea (adult) (pediatric): Secondary | ICD-10-CM | POA: Diagnosis not present

## 2015-12-11 NOTE — Progress Notes (Signed)
Subjective:    Patient ID: Jason [name], male    DOB: December 29, 1940, 74 y.o.   MRN: [id number]  HPI  74 year old seen for sleep consult 04/08/2015  He had a bout of depression in his 60s requiring ECT. 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 He is maintained on clonazepam, olanzapine, lithium by psychiatry.     TEST  HST showed AHI 27/hr . However attended PSG AHI 1/h (felt to underestimated since he only slept for 2hr)  Underwent CPAP titration on 07/06/15 that showed moderate Central Sleep Apnea with (CAI 20.3/hr )    12/11/2015  Follow up : OSA Pt returns for a 2 month follow up  He was started on CPAP in August. Says he is doing okay on CPAP , wears most night for at least 4 hr .  Feels some rested but has daytime fatigue. Has cut back on some of his psych meds.  Still feels tired.  Download November 15 through December 14 shows good compliance with average usage around 8 hours. He is on AutoSet 5-15. AHI 7.9. 1 central event.  Patient does have some positive leaks.. We discussed change in his full face mask to a nasal mask. However, he declines..    Review of Systems  Constitutional:   No  weight loss, night sweats,  Fevers, chills,  +fatigue, or  lassitude.  HEENT:   No headaches,  Difficulty swallowing,  Tooth/dental problems, or  Sore throat,                No sneezing, itching, ear ache, nasal congestion, post nasal drip,   CV:  No chest pain,  Orthopnea, PND, swelling in lower extremities, anasarca, dizziness, palpitations, syncope.   GI  No heartburn, indigestion, abdominal pain, nausea, vomiting, diarrhea, change in bowel habits, loss of appetite, bloody stools.   Resp: No shortness of breath with exertion or at rest.  No excess mucus, no productive cough,  No non-productive cough,  No coughing up of blood.  No change in color of mucus.  No wheezing.  No chest wall deformity  Skin: no rash or lesions.  GU: no  dysuria, change in color of urine, no urgency or frequency.  No flank pain, no hematuria   MS:  No joint pain or swelling.  No decreased range of motion.  No back pain.  Psych:  +chronic depression          Objective:   Physical Exam  Filed Vitals:   12/11/15 1201 12/11/15 1203  BP: 130/72   Pulse:  75  Height:  6' 2.5" (1.892 m)  Weight:  209 lb 3.2 oz (94.892 kg)  SpO2:  100%    GEN: A/Ox3; depressed affect  , NAD, tall   HEENT:  [place]/AT,  EACs-clear, TMs-wnl, NOSE-clear, THROAT-clear, no lesions, no postnasal drip or exudate noted. , class 2 airway   NECK:  Supple w/ fair ROM; no JVD; normal carotid impulses w/o bruits; no thyromegaly or nodules palpated; no lymphadenopathy.  RESP  Clear  P & A; w/o, wheezes/ rales/ or rhonchi.no accessory muscle use, no dullness to percussion  CARD:  RRR, no m/r/g  , no peripheral edema, pulses intact, no cyanosis or clubbing.  GI:   Soft & nt; nml bowel sounds; no organomegaly or masses detected.  Musco: Warm bil, no deformities or joint swelling noted.   Neuro: alert, no focal deficits noted.    Skin: Warm, no  lesions or rashes         Assessment & Plan:

## 2015-12-11 NOTE — Assessment & Plan Note (Signed)
Continue on CPAP At bedtime   Wear for at least 6hrs each night .  Do not drive if sleepy.  Follow up Dr. Elsworth Soho  In 6 months and As needed

## 2015-12-11 NOTE — Patient Instructions (Signed)
Continue on CPAP At bedtime   Wear for at least 6hrs each night .  Do not drive if sleepy.  Follow up Dr. Elsworth Soho  In 6 months and As needed

## 2015-12-29 NOTE — Progress Notes (Signed)
Reviewed & agree with plan  

## 2016-01-20 ENCOUNTER — Ambulatory Visit (INDEPENDENT_AMBULATORY_CARE_PROVIDER_SITE_OTHER): Payer: Medicare Other | Admitting: Family Medicine

## 2016-01-20 ENCOUNTER — Encounter: Payer: Self-pay | Admitting: Family Medicine

## 2016-01-20 VITALS — BP 100/80 | HR 102 | Temp 98.7°F | Ht 74.5 in | Wt 205.6 lb

## 2016-01-20 DIAGNOSIS — E7800 Pure hypercholesterolemia, unspecified: Secondary | ICD-10-CM

## 2016-01-20 DIAGNOSIS — Z125 Encounter for screening for malignant neoplasm of prostate: Secondary | ICD-10-CM | POA: Diagnosis not present

## 2016-01-20 DIAGNOSIS — R413 Other amnesia: Secondary | ICD-10-CM | POA: Diagnosis not present

## 2016-01-20 DIAGNOSIS — I10 Essential (primary) hypertension: Secondary | ICD-10-CM | POA: Diagnosis not present

## 2016-01-20 DIAGNOSIS — E559 Vitamin D deficiency, unspecified: Secondary | ICD-10-CM

## 2016-01-20 LAB — BASIC METABOLIC PANEL
BUN: 14 mg/dL (ref 6–23)
CHLORIDE: 108 meq/L (ref 96–112)
CO2: 26 meq/L (ref 19–32)
Calcium: 9.9 mg/dL (ref 8.4–10.5)
Creatinine, Ser: 0.91 mg/dL (ref 0.40–1.50)
GFR: 86.94 mL/min (ref >60.00)
GLUCOSE: 95 mg/dL (ref 70–99)
POTASSIUM: 4.3 meq/L (ref 3.5–5.1)
SODIUM: 142 meq/L (ref 135–145)

## 2016-01-20 LAB — LIPID PANEL
CHOL/HDL RATIO: 3
Cholesterol: 160 mg/dL (ref 0–200)
HDL: 58.1 mg/dL (ref >39.00)
LDL CALC: 86 mg/dL (ref 0–99)
NONHDL: 101.7
Triglycerides: 77 mg/dL (ref 0.0–149.0)
VLDL: 15.4 mg/dL (ref 0.0–40.0)

## 2016-01-20 LAB — HEPATIC FUNCTION PANEL
ALT: 13 U/L (ref 0–53)
AST: 19 U/L (ref 0–37)
Albumin: 4.5 g/dL (ref 3.5–5.2)
Alkaline Phosphatase: 53 U/L (ref 39–117)
BILIRUBIN TOTAL: 0.6 mg/dL (ref 0.2–1.2)
Bilirubin, Direct: 0.1 mg/dL (ref 0.0–0.3)
Total Protein: 6.7 g/dL (ref 6.0–8.3)

## 2016-01-20 LAB — VITAMIN B12: Vitamin B-12: 358 pg/mL (ref 211–911)

## 2016-01-20 LAB — PSA: PSA: 3 ng/mL (ref 0.10–4.00)

## 2016-01-20 LAB — VITAMIN D 25 HYDROXY (VIT D DEFICIENCY, FRACTURES): VITD: 44.12 ng/mL (ref 30.00–100.00)

## 2016-01-20 LAB — TSH: TSH: 2.85 u[IU]/mL (ref 0.35–4.50)

## 2016-01-20 NOTE — Progress Notes (Signed)
Pre visit review using our clinic review tool, if applicable. No additional management support is needed unless otherwise documented below in the visit note. 

## 2016-01-20 NOTE — Progress Notes (Signed)
   Subjective:    Patient ID: Jason [name], male    DOB: Nov 09, 1941, 75 y.o.   MRN: [id number]  HPI Patient seen for medical follow-up.  He has chronic problems including hyperlipidemia, hypertension, obstructive sleep apnea, vitamin D deficiency, osteoarthritis. He is followed by pulmonary for obstructive sleep apnea and uses CPAP consistently. Medications reviewed. He has history of low vitamin D and takes 50,000 international units once weekly. Requesting follow-up level. He is due for follow-up lipids. His blood pressures been stable. No recent dizziness.  Wife is concerned he may be having some short-term memory issues recently. He is having difficulty with things like names. No recent falls. No recent active depression symptoms.  Past Medical History  Diagnosis Date  . Routine general medical examination at a health care facility   . Pneumonia, organism unspecified   . Atrial fibrillation ([place])   . Pure hypercholesterolemia   . Osteoarthrosis, unspecified whether generalized or localized, unspecified site   . Gout, unspecified   . Unspecified nonpsychotic mental disorder   . Colon polyp   . Anxiety   . Depression   . Hypertension    Past Surgical History  Procedure Laterality Date  . Neg hx      reports that he has never smoked. He has never used smokeless tobacco. He reports that he does not drink alcohol or use illicit drugs. family history includes Arthritis in his other; Dementia in his mother; Heart failure in his father; Pneumonia in his father. There is no history of Colon cancer. No Known Allergies    Review of Systems  Constitutional: Positive for fatigue.  Eyes: Negative for visual disturbance.  Respiratory: Negative for cough, chest tightness and shortness of breath.   Cardiovascular: Negative for chest pain, palpitations and leg swelling.  Neurological: Negative for dizziness, syncope, weakness, light-headedness and headaches.  Hematological: Does not  bruise/bleed easily.  Psychiatric/Behavioral: Negative for hallucinations, dysphoric mood and agitation.       Objective:   Physical Exam  Constitutional: He is oriented to person, place, and time. He appears well-developed and well-nourished.  HENT:  Right Ear: External ear normal.  Left Ear: External ear normal.  Mouth/Throat: Oropharynx is clear and moist.  Eyes: Pupils are equal, round, and reactive to light.  Neck: Neck supple. No thyromegaly present.  Cardiovascular: Normal rate and regular rhythm.   Pulmonary/Chest: Effort normal and breath sounds normal. No respiratory distress. He has no wheezes. He has no rales.  Musculoskeletal: He exhibits no edema.  Neurological: He is alert and oriented to person, place, and time.          Assessment & Plan:  #1 hypertension. Stable and at goal. Continue current medications  #2 hyperlipidemia. Check lipid and hepatic panel   #3 vitamin D deficiency. Repeat 25-hydroxy vitamin D level  #4 health maintenance. We had a long discussion regarding pros and cons of PSA testing. He is interested in still getting the screened.  #5 concern for possible short-term memory loss. Will add TSH and B12 to labs. They will return in 3 weeks and do mental status screening further at that point

## 2016-01-21 ENCOUNTER — Encounter: Payer: Self-pay | Admitting: Family Medicine

## 2016-01-21 ENCOUNTER — Encounter: Payer: Self-pay | Admitting: Adult Health

## 2016-02-02 DIAGNOSIS — B0230 Zoster ocular disease, unspecified: Secondary | ICD-10-CM | POA: Diagnosis not present

## 2016-02-03 DIAGNOSIS — F332 Major depressive disorder, recurrent severe without psychotic features: Secondary | ICD-10-CM | POA: Diagnosis not present

## 2016-02-09 ENCOUNTER — Other Ambulatory Visit: Payer: Self-pay | Admitting: Family Medicine

## 2016-02-10 ENCOUNTER — Ambulatory Visit: Payer: Self-pay | Admitting: Family Medicine

## 2016-02-17 ENCOUNTER — Ambulatory Visit (INDEPENDENT_AMBULATORY_CARE_PROVIDER_SITE_OTHER): Payer: Medicare Other | Admitting: Family Medicine

## 2016-02-17 ENCOUNTER — Encounter: Payer: Self-pay | Admitting: Family Medicine

## 2016-02-17 VITALS — BP 120/90 | HR 95 | Temp 98.6°F | Ht 74.5 in | Wt 202.8 lb

## 2016-02-17 DIAGNOSIS — R413 Other amnesia: Secondary | ICD-10-CM

## 2016-02-17 DIAGNOSIS — R972 Elevated prostate specific antigen [PSA]: Secondary | ICD-10-CM

## 2016-02-17 NOTE — Progress Notes (Signed)
Pre visit review using our clinic review tool, if applicable. No additional management support is needed unless otherwise documented below in the visit note. 

## 2016-02-17 NOTE — Progress Notes (Signed)
   Subjective:    Patient ID: Jason [name], male    DOB: 11-06-1941, 75 y.o.   MRN: [id number]  HPI   Follow-up  Patient seen today for a concern of memory impairment and PSA elevation.  Past medical history of hypertension and depression.  During prior visit on 01/20/16, patient voiced concern about difficulty with memory.  He and his wife concerned about recent change in memory and ability to recall individual names of persons.  Since his visit on 01/20/16, he began taking Cerefolin NAC one tab daily as recommended by his psychiatrist for memory. He reports feeling that is memory has improved since taking this supplement. However, he still requests an evaluation to assess his memory.  PSA checked times one month ago and 3.0 compared to last reading in January 2013 which was PSA level of 0.53.  No obstructive urinary symptoms.  Past Medical History  Diagnosis Date  . Routine general medical examination at a health care facility   . Pneumonia, organism unspecified   . Atrial fibrillation ([place])   . Pure hypercholesterolemia   . Osteoarthrosis, unspecified whether generalized or localized, unspecified site   . Gout, unspecified   . Unspecified nonpsychotic mental disorder   . Colon polyp   . Anxiety   . Depression   . Hypertension    Past Surgical History  Procedure Laterality Date  . Neg hx      reports that he has never smoked. He has never used smokeless tobacco. He reports that he does not drink alcohol or use illicit drugs. family history includes Arthritis in his other; Dementia in his mother; Heart failure in his father; Pneumonia in his father. There is no history of Colon cancer. No Known Allergies     Review of Systems  Constitutional: Negative.   HENT: Negative.   Eyes: Negative.   Respiratory: Negative.   Cardiovascular: Negative.   Gastrointestinal: Negative.   Endocrine: Negative.   Genitourinary: Negative.   Allergic/Immunologic: Negative.   Neurological:  Negative.   Hematological: Negative.   Psychiatric/Behavioral: Negative.        Objective:   Physical Exam  Constitutional: He is oriented to person, place, and time. He appears well-developed and well-nourished.  HENT:  Head: Normocephalic and atraumatic.  Cardiovascular: Normal rate, regular rhythm, normal heart sounds and intact distal pulses.   Pulmonary/Chest: Effort normal and breath sounds normal.  Musculoskeletal: Normal range of motion.  Neurological: He is alert and oriented to person, place, and time. He has normal reflexes.  Administered MMSE-Scored 30/30 -memory intact.  Skin: Skin is warm and dry.  Psychiatric: He has a normal mood and affect. His behavior is normal. Judgment and thought content normal.          Assessment & Plan:  1. Memory- Patient expressed concern regarding memory loss. Administered MMSE patient scored 30/30.  Pt reassured.  Recent B12 and TSH normal.  Plan: Will continue to monitor.  2. PSA level- current level 3.00 compared to level four years ago at 0.53.    Plan: Will recheck PSA in 6 months.  If continued elevation will consider referral to urology.   Follow-up on six months routine exam

## 2016-03-03 ENCOUNTER — Ambulatory Visit: Payer: Self-pay | Admitting: Family Medicine

## 2016-03-29 DIAGNOSIS — B0230 Zoster ocular disease, unspecified: Secondary | ICD-10-CM | POA: Diagnosis not present

## 2016-03-29 DIAGNOSIS — Z0100 Encounter for examination of eyes and vision without abnormal findings: Secondary | ICD-10-CM | POA: Diagnosis not present

## 2016-03-30 DIAGNOSIS — F332 Major depressive disorder, recurrent severe without psychotic features: Secondary | ICD-10-CM | POA: Diagnosis not present

## 2016-04-05 ENCOUNTER — Other Ambulatory Visit: Payer: Self-pay | Admitting: Family Medicine

## 2016-05-17 ENCOUNTER — Other Ambulatory Visit: Payer: Self-pay | Admitting: Family Medicine

## 2016-05-26 DIAGNOSIS — F332 Major depressive disorder, recurrent severe without psychotic features: Secondary | ICD-10-CM | POA: Diagnosis not present

## 2016-05-30 DIAGNOSIS — B0230 Zoster ocular disease, unspecified: Secondary | ICD-10-CM | POA: Diagnosis not present

## 2016-06-02 DIAGNOSIS — F3341 Major depressive disorder, recurrent, in partial remission: Secondary | ICD-10-CM | POA: Diagnosis not present

## 2016-06-08 ENCOUNTER — Ambulatory Visit (INDEPENDENT_AMBULATORY_CARE_PROVIDER_SITE_OTHER): Payer: Medicare Other | Admitting: Pulmonary Disease

## 2016-06-08 ENCOUNTER — Encounter: Payer: Self-pay | Admitting: Pulmonary Disease

## 2016-06-08 VITALS — BP 100/70 | HR 72 | Ht 74.0 in | Wt 206.0 lb

## 2016-06-08 DIAGNOSIS — F32A Depression, unspecified: Secondary | ICD-10-CM

## 2016-06-08 DIAGNOSIS — G4733 Obstructive sleep apnea (adult) (pediatric): Secondary | ICD-10-CM

## 2016-06-08 DIAGNOSIS — F329 Major depressive disorder, single episode, unspecified: Secondary | ICD-10-CM | POA: Diagnosis not present

## 2016-06-08 NOTE — Patient Instructions (Addendum)
Events are better controlled, down to 6/hour with CPAP  Can trial of nasal mask with chinstrap in the future  Try to use for at least 6 hours every night Call us for any problems

## 2016-06-08 NOTE — Progress Notes (Signed)
   Subjective:    Patient ID: Jason [name], male    DOB: [birth year], 75 y.o.   MRN: [id number]  HPI  75 year old seen for sleep consult 04/08/2015  He had a bout of depression in his 34s requiring ECT. 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 He is maintained on clonazepam, olanzapine, lithium by psychiatry.    He has persistent tiredness and fatigue in spite of CPAP therapy   06/08/2016  Chief Complaint  Patient presents with  . Follow-up    Pt here for a 7 month follow up for OSA. Uses CPAP on average 4-6 hours. Pt changed mask to a medium and it seems to work.    89m FU  He has slacked off on his usage Download was reviewed and this shows several missed days- on days that he uses he almost goes up to 6-8 hours Events are better controlled with AHI down to 6/hour on auto settings with average pressure of 12 cm and minimal leak Uses a full face mask and has occasional bloating  He admits to feeling less sleepy on nights that he uses the machine He has difficult to control depression , hx of A Fib , HTN . No known hx of CVA or CHF   Weight is unchanged  Significant tests/ events  Home sleep study showed AHI of 27 events per hour ( per patient report) Surprisingly, attended PSG showed AHI of 1 per hour, total sleep time was decreased. No REM or supine sleep was noted. There was 8 minutes of desaturation noted  CPAP titration on 07/06/15  showed moderate Central Sleep Apnea with (CAI 20.3/hr )  And mild desats with O2 sat 89%. Moderate PLM but arousals were rare.  Felt he could have a complex sleep apnea since central apneas emerged during CPAP titration    Review of Systems Patient denies significant dyspnea,cough, hemoptysis,  chest pain, palpitations, pedal edema, orthopnea, paroxysmal nocturnal dyspnea, lightheadedness, nausea, vomiting, abdominal or  leg pains      Objective:   Physical Exam   Gen. Pleasant,  well-nourished, in no distress ENT - no lesions, no post nasal drip Neck: No JVD, no thyromegaly, no carotid bruits Lungs: no use of accessory muscles, no dullness to percussion, clear without rales or rhonchi  Cardiovascular: Rhythm regular, heart sounds  normal, no murmurs or gallops, no peripheral edema Musculoskeletal: No deformities, no cyanosis or clubbing         Assessment & Plan:

## 2016-06-08 NOTE — Assessment & Plan Note (Signed)
Events are better controlled, down to 6/hour with CPAP  Can trial of nasal mask with chinstrap in the future  Try to use for at least 6 hours every night Call us for any problems

## 2016-06-08 NOTE — Assessment & Plan Note (Signed)
He has noted some improvement in mood and quality of life since using CPAP

## 2016-06-20 ENCOUNTER — Encounter: Payer: Self-pay | Admitting: Pulmonary Disease

## 2016-08-10 ENCOUNTER — Other Ambulatory Visit: Payer: Self-pay | Admitting: Family Medicine

## 2016-08-16 ENCOUNTER — Ambulatory Visit (INDEPENDENT_AMBULATORY_CARE_PROVIDER_SITE_OTHER): Payer: Medicare Other | Admitting: Family Medicine

## 2016-08-16 ENCOUNTER — Encounter: Payer: Self-pay | Admitting: Family Medicine

## 2016-08-16 VITALS — BP 100/70 | HR 78 | Temp 98.6°F | Ht 74.0 in | Wt 207.7 lb

## 2016-08-16 DIAGNOSIS — R972 Elevated prostate specific antigen [PSA]: Secondary | ICD-10-CM

## 2016-08-16 LAB — PSA: PSA: 0.83 ng/mL (ref 0.10–4.00)

## 2016-08-16 NOTE — Progress Notes (Signed)
Pre visit review using our clinic review tool, if applicable. No additional management support is needed unless otherwise documented below in the visit note. 

## 2016-08-16 NOTE — Progress Notes (Signed)
Subjective:     Patient ID: Jason [name], male   DOB: 1941-01-20, 75 y.o.   MRN: [id number]  HPI Patient here to follow-up regarding increased PSA velocity. He had PSA over year ago 0.53 and most recent PSA back and January 3.0. No dysuria. No slow stream. Overall feels well. Her chronic problems include history of depression, obstructive sleep apnea, hyperlipidemia, hypertension  He is followed by psychiatry regarding his depression and that is stable. Takes lithium. Recent TSH normal.  Past Medical History:  Diagnosis Date  . Anxiety   . Atrial fibrillation ([place])   . Colon polyp   . Depression   . Gout, unspecified   . Hypertension   . Osteoarthrosis, unspecified whether generalized or localized, unspecified site   . Pneumonia, organism unspecified   . Pure hypercholesterolemia   . Routine general medical examination at a health care facility   . Unspecified nonpsychotic mental disorder    Past Surgical History:  Procedure Laterality Date  . neg hx      reports that he has never smoked. He has never used smokeless tobacco. He reports that he does not drink alcohol or use drugs. family history includes Arthritis in his other; Dementia in his mother; Heart failure in his father; Pneumonia in his father. No Known Allergies   Review of Systems  Constitutional: Negative for chills and fever.  Genitourinary: Negative for decreased urine volume, difficulty urinating, dysuria, flank pain, hematuria and urgency.       Objective:   Physical Exam  Constitutional: He appears well-developed and well-nourished.  Cardiovascular: Normal rate and regular rhythm.   Pulmonary/Chest: Effort normal and breath sounds normal. No respiratory distress. He has no wheezes. He has no rales.  Psychiatric: He has a normal mood and affect. His behavior is normal. Judgment and thought content normal.       Assessment:     #1 history of increased PSA velocity. Asymptomatic  #2 hypertension stable  and at goal    Plan:     -Repeat PSA. -Continue current medications -Continue follow-up with psychiatry -We'll plan follow-up in 6 months and obtain further labs that point including lipids  [name] MD [facility] at [facility]

## 2016-08-17 ENCOUNTER — Encounter: Payer: Self-pay | Admitting: Gastroenterology

## 2016-08-18 DIAGNOSIS — F332 Major depressive disorder, recurrent severe without psychotic features: Secondary | ICD-10-CM | POA: Diagnosis not present

## 2016-08-21 ENCOUNTER — Emergency Department (HOSPITAL_BASED_OUTPATIENT_CLINIC_OR_DEPARTMENT_OTHER): Payer: Medicare Other

## 2016-08-21 ENCOUNTER — Emergency Department (HOSPITAL_BASED_OUTPATIENT_CLINIC_OR_DEPARTMENT_OTHER)
Admission: EM | Admit: 2016-08-21 | Discharge: 2016-08-22 | Disposition: A | Discharge status: Home / Self Care | Payer: Medicare Other | Attending: Emergency Medicine | Admitting: Emergency Medicine

## 2016-08-21 ENCOUNTER — Encounter (HOSPITAL_BASED_OUTPATIENT_CLINIC_OR_DEPARTMENT_OTHER): Payer: Self-pay | Admitting: Emergency Medicine

## 2016-08-21 DIAGNOSIS — I10 Essential (primary) hypertension: Secondary | ICD-10-CM | POA: Insufficient documentation

## 2016-08-21 DIAGNOSIS — M545 Low back pain, unspecified: Secondary | ICD-10-CM

## 2016-08-21 DIAGNOSIS — Z7982 Long term (current) use of aspirin: Secondary | ICD-10-CM | POA: Diagnosis not present

## 2016-08-21 DIAGNOSIS — Z79899 Other long term (current) drug therapy: Secondary | ICD-10-CM | POA: Insufficient documentation

## 2016-08-21 MED ORDER — KETOROLAC TROMETHAMINE 60 MG/2ML IM SOLN
15.0000 mg | Freq: Once | INTRAMUSCULAR | Status: AC
  Administered 2016-08-21: 15 mg via INTRAMUSCULAR
  Filled 2016-08-21: qty 2

## 2016-08-21 MED ORDER — CYCLOBENZAPRINE HCL 5 MG PO TABS: 5.0000 mg | ORAL_TABLET | Freq: Two times a day (BID) | ORAL | 0 refills | Status: DC | PRN

## 2016-08-21 MED ORDER — CYCLOBENZAPRINE HCL 5 MG PO TABS
5.0000 mg | ORAL_TABLET | Freq: Once | ORAL | Status: AC
  Administered 2016-08-21: 5 mg via ORAL
  Filled 2016-08-21: qty 1

## 2016-08-21 MED ORDER — METHYLPREDNISOLONE 4 MG PO TBPK: ORAL_TABLET | ORAL | 0 refills | Status: DC

## 2016-08-21 NOTE — ED Provider Notes (Signed)
[facility] DEPT [facility] Provider Note   CSN: [id number] Arrival date & time: 08/21/16  2117  By signing my name below, I, Dyke Brackett, attest that this documentation has been prepared under the direction and in the presence of No att. providers found . Electronically Signed: Dyke Brackett, Scribe. 08/21/2016. 11:33 PM.   History   Chief Complaint Chief Complaint  Patient presents with  . Back Pain    left lower   HPI Jason [name] is a 75 y.o. male who presents to the Emergency Department complaining of intermittent, sharp, worsening left lower back pain which began 2-3 weeks ago after cleaning. He states tonight he had difficulty standing up after dinner. His pain is worsened by some positions-- he rates his pain as 0 while sitting. Pt has taken two aleve and meloxicam with no relief. Pt denies numbness, weakness, leg pain, dysuria, or constipation. Pt's PCP is Dr. [name]. Denies fever.  The history is provided by the patient. No language interpreter was used.   Past Medical History:  Diagnosis Date  . Anxiety   . Atrial fibrillation ([place])   . Colon polyp   . Depression   . Gout, unspecified   . Hypertension   . Osteoarthrosis, unspecified whether generalized or localized, unspecified site   . Pneumonia, organism unspecified   . Pure hypercholesterolemia   . Routine general medical examination at a health care facility   . Unspecified nonpsychotic mental disorder     Patient Active Problem List   Diagnosis Date Noted  . Depression 08/18/2015  . OSA (obstructive sleep apnea) 01/19/2015  . Vitamin D deficiency 01/07/2013  . Need for prophylactic vaccination and inoculation against influenza 11/14/2011  . Hypertension 11/14/2011  . Osteoarthritis 11/20/2009  . PSYCHIATRIC DISORDER 07/16/2008  . History of pneumonia 07/16/2008  . History of atrial fibrillation   . HYPERCHOLESTEROLEMIA     Past Surgical History:  Procedure Laterality Date  . neg hx      Home  Medications    Prior to Admission medications   Medication Sig Start Date End Date Taking? Authorizing Provider  amLODipine (NORVASC) 5 MG tablet TAKE 1 TABLET (5 MG TOTAL) BY MOUTH DAILY. 04/05/16  Yes [name], MD  aspirin 81 MG tablet Take 81 mg by mouth daily.     Yes Historical Provider, MD  clonazePAM (KLONOPIN) 0.5 MG tablet Take 0.5 mg by mouth at bedtime.   Yes Historical Provider, MD  Coenzyme Q10 (COQ10) 100 MG CAPS Take 1 capsule by mouth daily.   Yes Historical Provider, MD  lithium carbonate 300 MG capsule Take 300 mg by mouth. Takes 2 tablets once per day with dinner. 07/17/15  Yes Historical Provider, MD  meloxicam (MOBIC) 15 MG tablet Take 15 mg by mouth as needed. One whole or half a tablet as needed.   Yes Historical Provider, MD  Methylfol-Algae-B12-Acetylcyst (CEREFOLIN NAC) 6-90.314-2-600 MG TABS Take 1 tablet by mouth daily.   Yes Historical Provider, MD  Multiple Vitamins-Minerals (MULTIVITAMIN & MINERAL PO) Take 1 tablet by mouth daily   Yes Historical Provider, MD  OLANZapine (ZYPREXA) 2.5 MG tablet Take 2.5 mg by mouth at bedtime.  07/04/15  Yes Historical Provider, MD  Omega-3 Krill Oil 500 MG CAPS Take 1 capsule by mouth daily.   Yes Historical Provider, MD  Probiotic Product (PROBIOTIC DAILY PO) Take 1 tablet by mouth every other day.   Yes Historical Provider, MD  sertraline (ZOLOFT) 100 MG tablet Take 200 mg by mouth daily.  Yes Historical Provider, MD  simvastatin (ZOCOR) 20 MG tablet TAKE 1 TABLET DAILY 05/17/16  Yes [name], MD  Vitamin D, Ergocalciferol, (DRISDOL) 50000 units CAPS capsule TAKE 1 CAPSULE EVERY 7 DAYS 08/10/16  Yes [name], MD  cyclobenzaprine (FLEXERIL) 5 MG tablet Take 1 tablet (5 mg total) by mouth 2 (two) times daily as needed for muscle spasms. 08/21/16   Merryl Hacker, MD  methylPREDNISolone (MEDROL DOSEPAK) 4 MG TBPK tablet Take as directed on packet 08/21/16   Merryl Hacker, MD    Family History Family  History  Problem Relation Age of Onset  . Dementia Mother   . Pneumonia Father   . Heart failure Father   . Arthritis Other   . Colon cancer Neg Hx     Social History Social History  Substance Use Topics  . Smoking status: Never Smoker  . Smokeless tobacco: Never Used  . Alcohol use No    Allergies   Review of patient's allergies indicates no known allergies.   Review of Systems Review of Systems  Constitutional: Negative for fever.  Gastrointestinal: Negative for constipation.  Genitourinary: Negative for dysuria and hematuria.  Musculoskeletal: Positive for back pain.  Neurological: Negative for weakness and numbness.  All other systems reviewed and are negative.  Physical Exam Updated Vital Signs BP 152/78 (BP Location: Left Arm)   Pulse 70   Temp 97.8 F (36.6 C) (Oral)   Resp 18   Ht 6\' 2"  (1.88 m)   Wt 205 lb (93 kg)   SpO2 96%   BMI 26.32 kg/m   Physical Exam  Constitutional: He is oriented to person, place, and time. He appears well-developed and well-nourished.  Elderly  HENT:  Head: Normocephalic and atraumatic.  Cardiovascular: Normal rate, regular rhythm and normal heart sounds.   No murmur heard. Pulmonary/Chest: Effort normal and breath sounds normal. No respiratory distress. He has no wheezes.  Abdominal: Soft. Bowel sounds are normal. There is no tenderness.  Musculoskeletal: He exhibits no deformity.  No midline lumbar tenderness, tenderness to palpation over the left SI joint, negative straight leg raise  Neurological: He is alert and oriented to person, place, and time.  Father 5 strength bilateral lower extremities, no clonus  Skin: Skin is warm and dry.  Psychiatric: He has a normal mood and affect.  Nursing note and vitals reviewed.   ED Treatments / Results  DIAGNOSTIC STUDIES:  Oxygen Saturation is 96% on RA, adequate by my interpretation.    COORDINATION OF CARE:  11:33 PM Discussed treatment plan with pt at bedside and pt  agreed to plan.   Labs (all labs ordered are listed, but only abnormal results are displayed) Labs Reviewed - No data to display  EKG  EKG Interpretation None       Radiology Dg Lumbar Spine Complete  Result Date: 08/21/2016 CLINICAL DATA:  Low back pain for 2-3 weeks, nontraumatic. EXAM: LUMBAR SPINE - COMPLETE 4+ VIEW COMPARISON:  None. FINDINGS: The lumbar vertebrae are normal in height. No fracture is evident. No spondylolysis or spondylolisthesis is evident. Moderately severe degenerative lumbar disc disease is present from L3 through L5. Mild facet arthritis is also present. IMPRESSION: Degenerative disc and facet changes.  Negative for acute fracture Electronically Signed   By: Andreas Newport M.D.   On: 08/21/2016 22:30    Procedures Procedures (including critical care time)  Medications Ordered in ED Medications  ketorolac (TORADOL) injection 15 mg (15 mg Intramuscular Given 08/21/16 2354)  cyclobenzaprine (FLEXERIL) tablet 5 mg (5 mg Oral Given 08/21/16 2353)     Initial Impression / Assessment and Plan / ED Course  I have reviewed the triage vital signs and the nursing notes.  Pertinent labs & imaging results that were available during my care of the patient were reviewed by me and considered in my medical decision making (see chart for details).  Clinical Course    Patient presents with left lower back pain. He is nontoxic on exam. Reports pain is worse with movement and started after doing some cleaning on the floor. No signs or symptoms of cauda equina. His x-ray shows degenerative disc disease but no fracture. He's tender over the SI joint. Patient was given Toradol and Flexeril. Will discharge with a Medrol Dosepak and Flexeril. If he has persistent pain he needs to be reevaluated and may need further imaging.  After history, exam, and medical workup I feel the patient has been appropriately medically screened and is safe for discharge home. Pertinent diagnoses  were discussed with the patient. Patient was given return precautions.   Final Clinical Impressions(s) / ED Diagnoses   Final diagnoses:  Left-sided low back pain without sciatica   I personally performed the services described in this documentation, which was scribed in my presence. The recorded information has been reviewed and is accurate.  New Prescriptions Discharge Medication List as of 08/21/2016 11:58 PM    START taking these medications   Details  cyclobenzaprine (FLEXERIL) 5 MG tablet Take 1 tablet (5 mg total) by mouth 2 (two) times daily as needed for muscle spasms., Starting Sun 08/21/2016, Print    methylPREDNISolone (MEDROL DOSEPAK) 4 MG TBPK tablet Take as directed on packet, Print         Merryl Hacker, MD 08/22/16 [phone]

## 2016-08-21 NOTE — Discharge Instructions (Signed)
You were seen today for back pain. Your xray shows degenerative disc disease.  You will be given a muscle relaxer and steroids for pain.  If symptoms persist, follow-up with your PCP for recheck.

## 2016-08-21 NOTE — ED Notes (Signed)
Dr Dina Rich at bedside for evaluation

## 2016-08-21 NOTE — ED Triage Notes (Addendum)
Patient states @ 3 weeks ago he began having left low back pain. Patient reports the pain started after "cleaning". Patient reports the pain seems to be worsening. Patient denies any associated symptoms, patient took 1 meloxican without relief since the pain started and 2 Aleve without relief as well. Patient states tonight he is coming for evaluation because he had a hard time getting up after sitting at dinner. Patient is ambulatory.

## 2016-08-22 DIAGNOSIS — M545 Low back pain: Secondary | ICD-10-CM | POA: Diagnosis not present

## 2016-09-05 DIAGNOSIS — M545 Low back pain: Secondary | ICD-10-CM | POA: Diagnosis not present

## 2016-10-24 DIAGNOSIS — F3341 Major depressive disorder, recurrent, in partial remission: Secondary | ICD-10-CM | POA: Diagnosis not present

## 2016-10-25 DIAGNOSIS — F332 Major depressive disorder, recurrent severe without psychotic features: Secondary | ICD-10-CM | POA: Diagnosis not present

## 2016-11-29 ENCOUNTER — Ambulatory Visit (INDEPENDENT_AMBULATORY_CARE_PROVIDER_SITE_OTHER): Payer: Medicare Other

## 2016-11-29 ENCOUNTER — Ambulatory Visit: Payer: Self-pay

## 2016-11-29 DIAGNOSIS — Z23 Encounter for immunization: Secondary | ICD-10-CM | POA: Diagnosis not present

## 2017-01-04 DIAGNOSIS — N411 Chronic prostatitis: Secondary | ICD-10-CM | POA: Diagnosis not present

## 2017-01-04 DIAGNOSIS — N402 Nodular prostate without lower urinary tract symptoms: Secondary | ICD-10-CM | POA: Diagnosis not present

## 2017-01-11 ENCOUNTER — Other Ambulatory Visit: Payer: Self-pay | Admitting: Family Medicine

## 2017-02-13 ENCOUNTER — Telehealth: Payer: Self-pay | Admitting: Family Medicine

## 2017-02-17 ENCOUNTER — Encounter: Payer: Self-pay | Admitting: Family Medicine

## 2017-02-17 ENCOUNTER — Ambulatory Visit (INDEPENDENT_AMBULATORY_CARE_PROVIDER_SITE_OTHER): Payer: Medicare Other | Admitting: Family Medicine

## 2017-02-17 VITALS — BP 110/80 | HR 70 | Ht 74.0 in | Wt 213.4 lb

## 2017-02-17 DIAGNOSIS — Z79899 Other long term (current) drug therapy: Secondary | ICD-10-CM

## 2017-02-17 DIAGNOSIS — I10 Essential (primary) hypertension: Secondary | ICD-10-CM | POA: Diagnosis not present

## 2017-02-17 DIAGNOSIS — E7800 Pure hypercholesterolemia, unspecified: Secondary | ICD-10-CM

## 2017-02-17 LAB — BASIC METABOLIC PANEL
BUN: 16 mg/dL (ref 6–23)
CALCIUM: 10 mg/dL (ref 8.4–10.5)
CO2: 28 mEq/L (ref 19–32)
Chloride: 106 mEq/L (ref 96–112)
Creatinine, Ser: 0.98 mg/dL (ref 0.40–1.50)
GFR: 79.57 mL/min (ref >60.00)
GLUCOSE: 93 mg/dL (ref 70–99)
Potassium: 4.2 mEq/L (ref 3.5–5.1)
SODIUM: 139 meq/L (ref 135–145)

## 2017-02-17 LAB — HEPATIC FUNCTION PANEL
ALBUMIN: 4.6 g/dL (ref 3.5–5.2)
ALK PHOS: 66 U/L (ref 39–117)
ALT: 11 U/L (ref 0–53)
AST: 18 U/L (ref 0–37)
BILIRUBIN TOTAL: 0.7 mg/dL (ref 0.2–1.2)
Bilirubin, Direct: 0.1 mg/dL (ref 0.0–0.3)
Total Protein: 6.8 g/dL (ref 6.0–8.3)

## 2017-02-17 LAB — LIPID PANEL
CHOL/HDL RATIO: 3
CHOLESTEROL: 184 mg/dL (ref 0–200)
HDL: 71.4 mg/dL (ref >39.00)
LDL CALC: 93 mg/dL (ref 0–99)
NONHDL: 113.06
Triglycerides: 101 mg/dL (ref 0.0–149.0)
VLDL: 20.2 mg/dL (ref 0.0–40.0)

## 2017-02-17 LAB — TSH: TSH: 3.18 u[IU]/mL (ref 0.35–4.50)

## 2017-02-17 NOTE — Progress Notes (Signed)
Pre visit review using our clinic review tool, if applicable. No additional management support is needed unless otherwise documented below in the visit note. 

## 2017-02-17 NOTE — Progress Notes (Signed)
Subjective:     Patient ID: Jason [name], male   DOB: 23-Jul-1941, 76 y.o.   MRN: [id number]  HPI Reason seen for routine medical follow-up. Recent prostatitis treated by urologist. He has hypertension treated with amlodipine. Home blood pressures been stable. He has hyperlipidemia treated with simvastatin. No myalgias. Is due for lab work today. He is followed by psychiatry and has history of depression currently stable sertraline and lithium. Last thyroid function with little over year ago.  Past Medical History:  Diagnosis Date  . Anxiety   . Atrial fibrillation ([place])   . Colon polyp   . Depression   . Gout, unspecified   . Hypertension   . Osteoarthrosis, unspecified whether generalized or localized, unspecified site   . Pneumonia, organism unspecified([place])   . Pure hypercholesterolemia   . Routine general medical examination at a health care facility   . Unspecified nonpsychotic mental disorder    Past Surgical History:  Procedure Laterality Date  . neg hx      reports that he has never smoked. He has never used smokeless tobacco. He reports that he does not drink alcohol or use drugs. family history includes Arthritis in his other; Dementia in his mother; Heart failure in his father; Pneumonia in his father. No Known Allergies   Review of Systems  Constitutional: Negative for fatigue.  Eyes: Negative for visual disturbance.  Respiratory: Negative for cough, chest tightness and shortness of breath.   Cardiovascular: Negative for chest pain, palpitations and leg swelling.  Endocrine: Negative for polydipsia and polyuria.  Neurological: Negative for dizziness, syncope, weakness, light-headedness and headaches.       Objective:   Physical Exam  Constitutional: He is oriented to person, place, and time. He appears well-developed and well-nourished.  HENT:  Right Ear: External ear normal.  Left Ear: External ear normal.  Mouth/Throat: Oropharynx is clear and moist.  Eyes:  Pupils are equal, round, and reactive to light.  Neck: Neck supple. No thyromegaly present.  Cardiovascular: Normal rate and regular rhythm.   Pulmonary/Chest: Effort normal and breath sounds normal. No respiratory distress. He has no wheezes. He has no rales.  Musculoskeletal: He exhibits no edema.  Neurological: He is alert and oriented to person, place, and time.       Assessment:     #1 hypertension stable and at goal  #2 dyslipidemia  #3 history of recurrent depression on lithium and in need of TSH monitoring.    Plan:     -Check labs with basic metabolic panel, lipid panel, hepatic panel, TSH -Schedule Medicare wellness visit -Routine follow-up 6 months  [name] MD [facility] at [facility]

## 2017-02-19 ENCOUNTER — Encounter: Payer: Self-pay | Admitting: Family Medicine

## 2017-03-09 DIAGNOSIS — F3341 Major depressive disorder, recurrent, in partial remission: Secondary | ICD-10-CM | POA: Diagnosis not present

## 2017-03-14 DIAGNOSIS — F332 Major depressive disorder, recurrent severe without psychotic features: Secondary | ICD-10-CM | POA: Diagnosis not present

## 2017-04-06 ENCOUNTER — Other Ambulatory Visit: Payer: Self-pay | Admitting: Family Medicine

## 2017-05-09 NOTE — Telephone Encounter (Signed)
Called to see if pt wanted to schedule awv - left message.( pt will be here Fri 2/23- told him we could probably schedule this on the same day )

## 2017-06-30 DIAGNOSIS — H2513 Age-related nuclear cataract, bilateral: Secondary | ICD-10-CM | POA: Diagnosis not present

## 2017-06-30 DIAGNOSIS — H5203 Hypermetropia, bilateral: Secondary | ICD-10-CM | POA: Diagnosis not present

## 2017-07-03 ENCOUNTER — Encounter: Payer: Self-pay | Admitting: Gastroenterology

## 2017-07-07 ENCOUNTER — Telehealth: Payer: Self-pay | Admitting: Family Medicine

## 2017-07-07 NOTE — Telephone Encounter (Signed)
Pt has AWV on 8/1 - health coach will be out of the office.  Left message for him to call us back to reschedule.  Mentioned that he has an appt on 8/24 - we can reschedule for that day since he will already be up here.

## 2017-07-14 ENCOUNTER — Telehealth: Payer: Self-pay | Admitting: Family Medicine

## 2017-07-14 NOTE — Telephone Encounter (Signed)
2nd attempt - pt has AWV on 8/1 - health coach will not be in the office.  Left message for pt to call us back so we can reschedule.

## 2017-08-02 ENCOUNTER — Telehealth: Payer: Self-pay | Admitting: Family Medicine

## 2017-08-02 ENCOUNTER — Other Ambulatory Visit: Payer: Self-pay | Admitting: Family Medicine

## 2017-08-02 NOTE — Telephone Encounter (Signed)
Pt has AWV scheduled for 8/14 - health coach has to leave early that day so we need to reschedule.  Left message for pt to call us back - pt will be here 8/24 - AWV after his appt?  There is a temporary hold on the 2pm slot in case he can stay.

## 2017-08-07 IMAGING — CR DG LUMBAR SPINE COMPLETE 4+V
5 series · 5 of 5 positions shown · non-contrast
Comparison: None.

CLINICAL DATA: Low back pain for 2-3 weeks, nontraumatic.

EXAM:
LUMBAR SPINE - COMPLETE 4+ VIEW

[t l-spine a.p.]
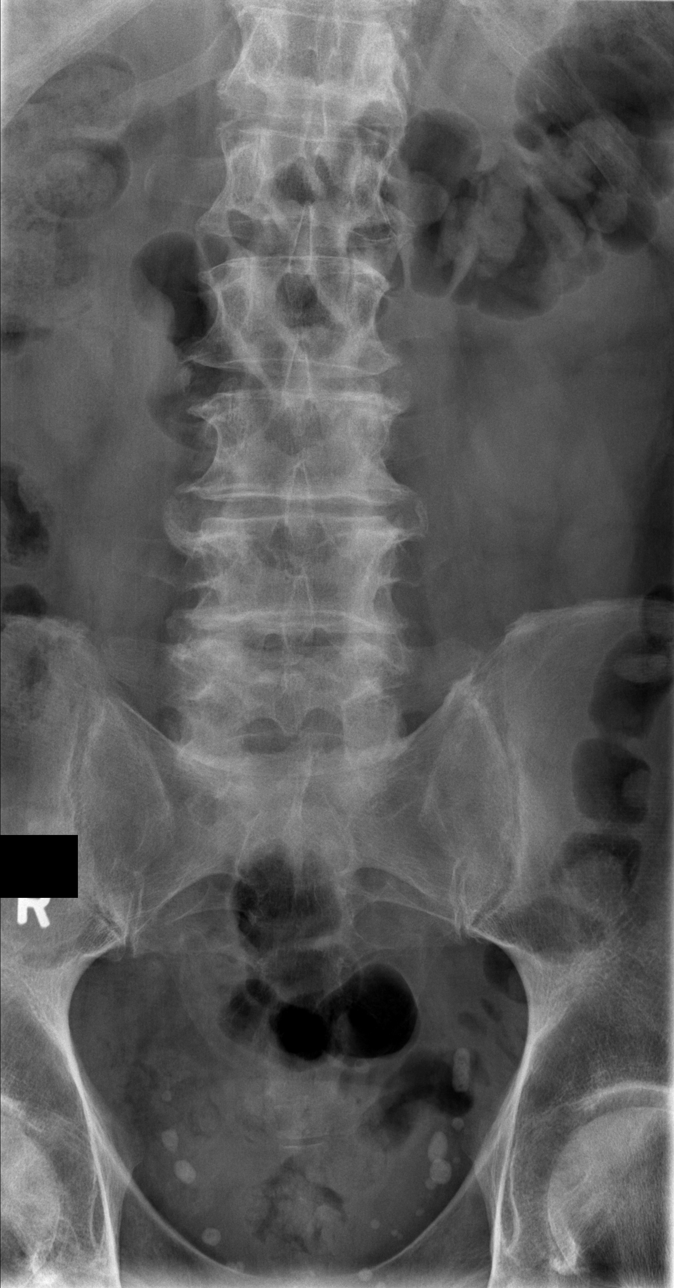

[t l-spine oblique exposure (1 of 2)]
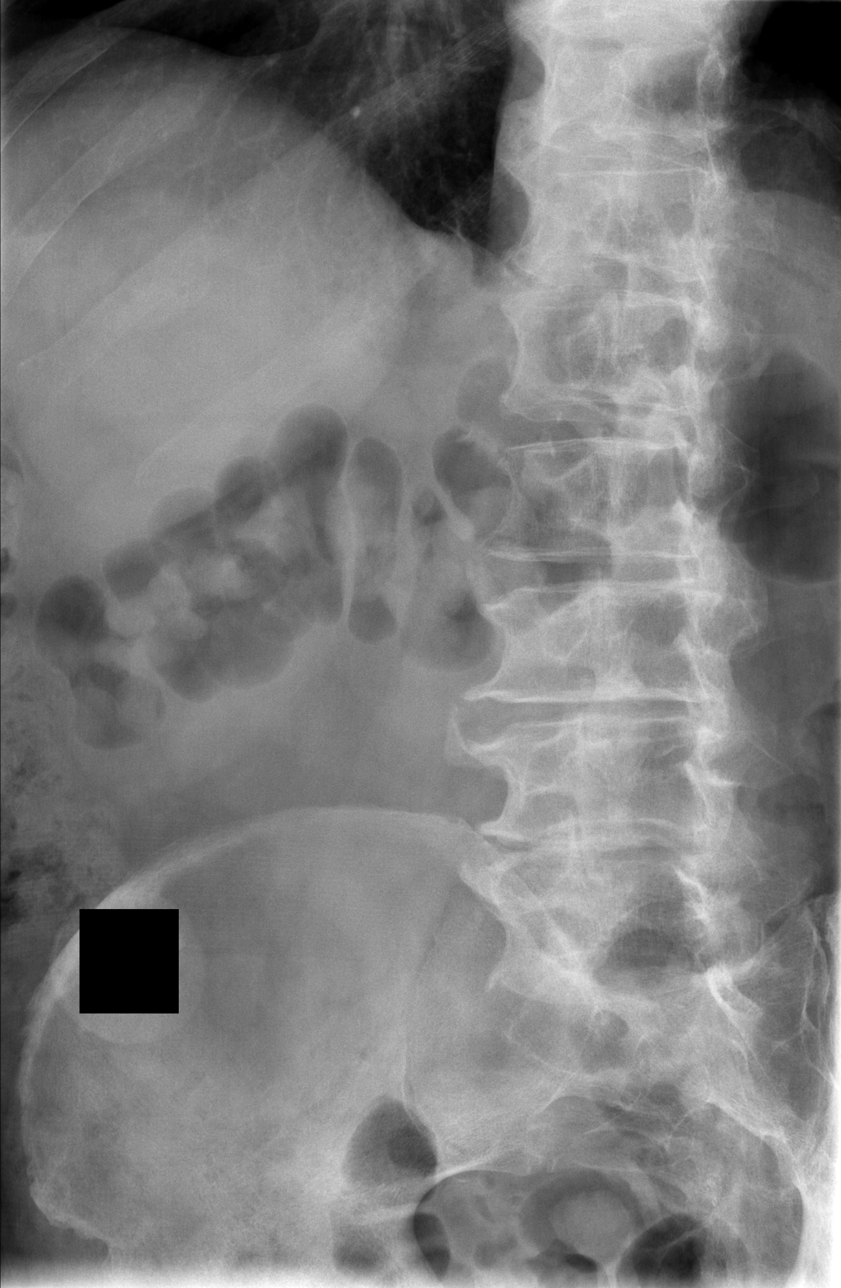

[t l-spine oblique exposure (2 of 2)]
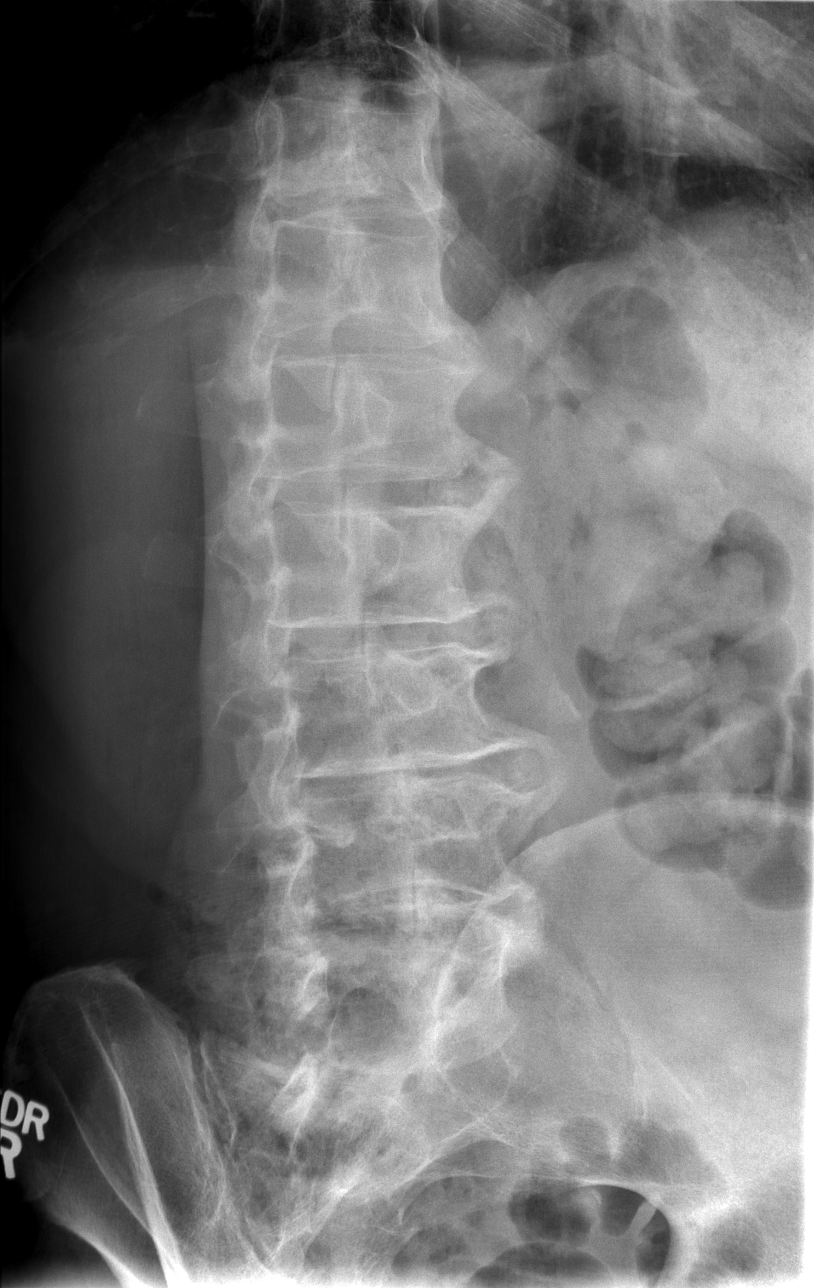

[t l-spine lat]
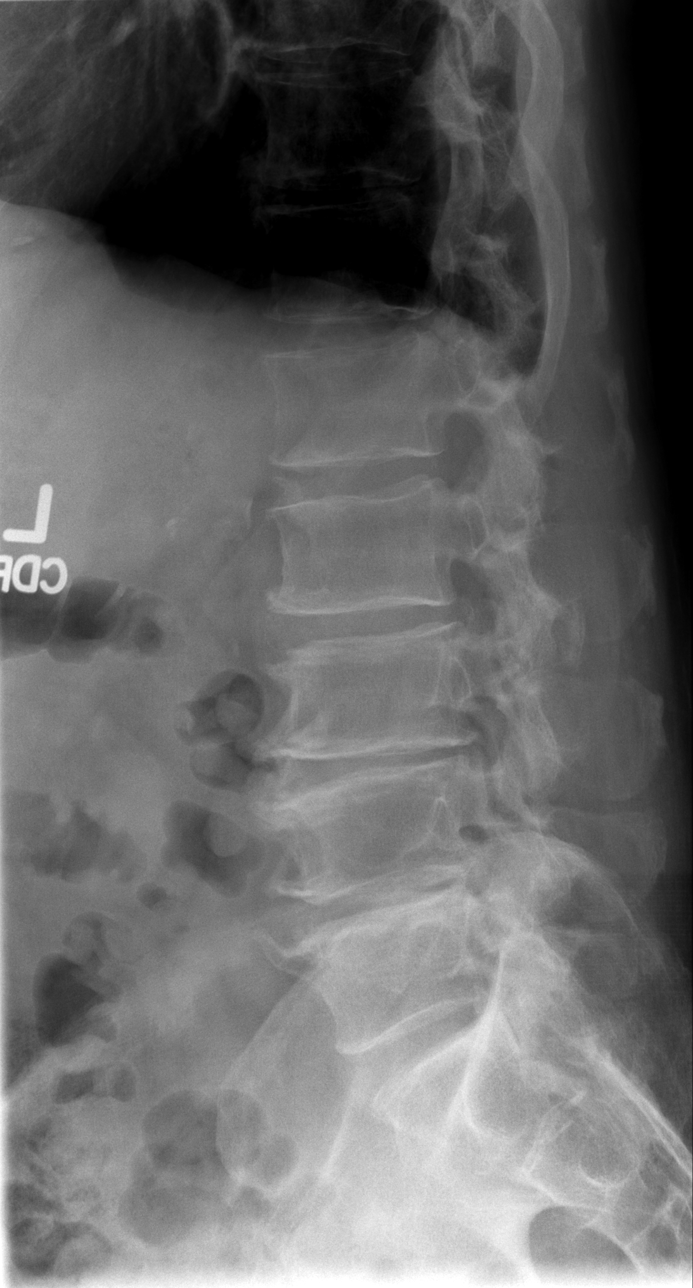

[t l-spine l5-s1 spot]
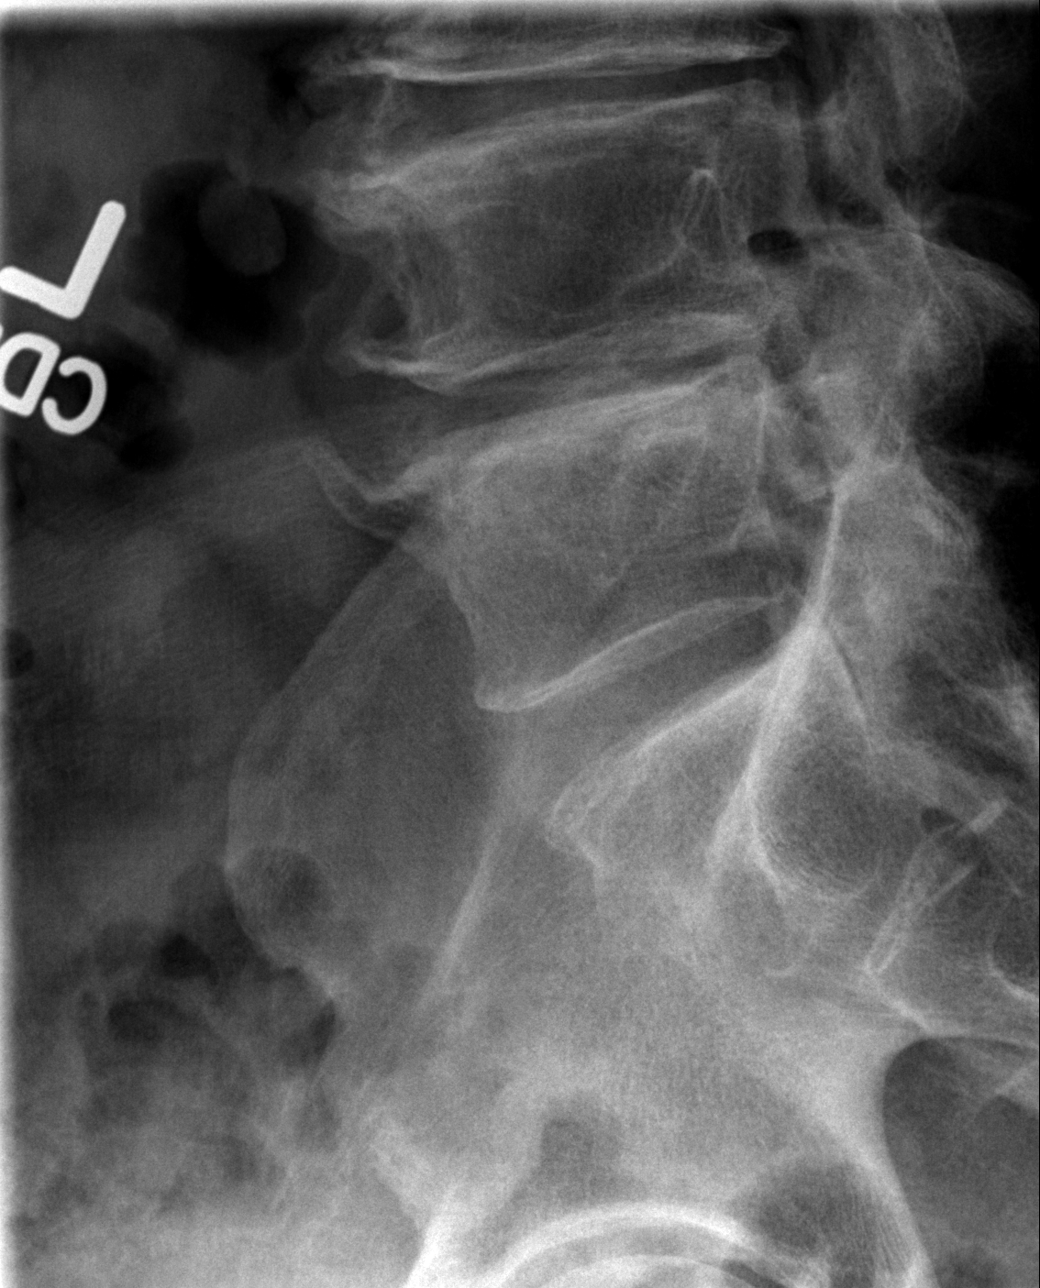

[5 of 5 positions shown; findings below may reference images not displayed]

FINDINGS: The lumbar vertebrae are normal in height. No fracture is evident.
No spondylolysis or spondylolisthesis is evident.

Moderately severe degenerative lumbar disc disease is present from
L3 through L5. Mild facet arthritis is also present.
IMPRESSION: Degenerative disc and facet changes.  Negative for acute fracture

## 2017-08-08 ENCOUNTER — Ambulatory Visit: Payer: Self-pay

## 2017-08-08 NOTE — Progress Notes (Signed)
Erroneous encounter

## 2017-08-16 DIAGNOSIS — F3341 Major depressive disorder, recurrent, in partial remission: Secondary | ICD-10-CM | POA: Diagnosis not present

## 2017-08-18 ENCOUNTER — Ambulatory Visit: Payer: Self-pay | Admitting: Family Medicine

## 2017-08-29 DIAGNOSIS — F332 Major depressive disorder, recurrent severe without psychotic features: Secondary | ICD-10-CM | POA: Diagnosis not present

## 2017-09-01 ENCOUNTER — Encounter: Payer: Self-pay | Admitting: *Deleted

## 2017-09-01 ENCOUNTER — Ambulatory Visit (AMBULATORY_SURGERY_CENTER): Payer: Self-pay | Admitting: *Deleted

## 2017-09-01 VITALS — Ht 74.5 in | Wt 213.0 lb

## 2017-09-01 DIAGNOSIS — Z1211 Encounter for screening for malignant neoplasm of colon: Secondary | ICD-10-CM

## 2017-09-01 MED ORDER — NA SULFATE-K SULFATE-MG SULF 17.5-3.13-1.6 GM/177ML PO SOLN: 1.0000 | Freq: Once | ORAL | 0 refills | Status: AC

## 2017-09-01 NOTE — Progress Notes (Signed)
No egg or soy allergy known to patient  No issues with past sedation with any surgeries  or procedures, no intubation problems  No diet pills per patient No home 02 use per patient  No blood thinners per patient  Pt states  issues with constipation - he say she takes a probiotic and with this he stools every 2-3 days and they always start out hard and end soft - has tried stool softeners with no success - due to this I am going to have pt do a 2 day prep - instructed ion PV today with wife and pt - all questions answered  Past hx of  A fib - not on any blood thinners currently EMMI video sent to pt's e mail - pt and wife declined video

## 2017-09-05 ENCOUNTER — Encounter: Payer: Self-pay | Admitting: Family Medicine

## 2017-09-05 ENCOUNTER — Ambulatory Visit (INDEPENDENT_AMBULATORY_CARE_PROVIDER_SITE_OTHER): Payer: Medicare Other | Admitting: Family Medicine

## 2017-09-05 VITALS — BP 110/80 | HR 76 | Temp 98.4°F | Wt 215.6 lb

## 2017-09-05 DIAGNOSIS — Z79899 Other long term (current) drug therapy: Secondary | ICD-10-CM

## 2017-09-05 DIAGNOSIS — Z23 Encounter for immunization: Secondary | ICD-10-CM

## 2017-09-05 DIAGNOSIS — E7800 Pure hypercholesterolemia, unspecified: Secondary | ICD-10-CM

## 2017-09-05 DIAGNOSIS — I10 Essential (primary) hypertension: Secondary | ICD-10-CM

## 2017-09-05 LAB — BASIC METABOLIC PANEL
BUN: 15 mg/dL (ref 6–23)
CALCIUM: 10 mg/dL (ref 8.4–10.5)
CHLORIDE: 107 meq/L (ref 96–112)
CO2: 25 mEq/L (ref 19–32)
CREATININE: 0.98 mg/dL (ref 0.40–1.50)
GFR: 79.45 mL/min (ref >60.00)
Glucose, Bld: 84 mg/dL (ref 70–99)
Potassium: 4 mEq/L (ref 3.5–5.1)
Sodium: 141 mEq/L (ref 135–145)

## 2017-09-05 LAB — TSH: TSH: 3.84 u[IU]/mL (ref 0.35–4.50)

## 2017-09-05 NOTE — Progress Notes (Signed)
Subjective:     Patient ID: Jason [name], male   DOB: May 14, 1941, 76 y.o.   MRN: [id number]  HPI   Patient seen for medical follow-up. He has history of hypertension, major depression, obstructive sleep apnea, mild hyperlipidemia. He is followed by psychiatry. Question of bipolar disorder. He is on Zyprexa and lithium. Needs follow-up basic metabolic panel and TSH. Depression symptoms are stable. He takes simvastatin for hyperlipidemia. Lipids were checked 6 months ago. All medications reviewed. Compliant with all. Denies any side effects. No recent chest pains. Not exercising any. Needs flu vaccine. He is considering new shingles vaccine.  Past Medical History:  Diagnosis Date  . Anxiety   . Atrial fibrillation ([place])    past hx  . Colon polyp   . Depression   . GERD (gastroesophageal reflux disease)   . Gout, unspecified   . Hypertension   . Osteoarthrosis, unspecified whether generalized or localized, unspecified site   . Pneumonia, organism unspecified(486)   . Pure hypercholesterolemia   . Routine general medical examination at a health care facility   . Sleep apnea    not using cpap currently  . Unspecified nonpsychotic mental disorder    Past Surgical History:  Procedure Laterality Date  . COLONOSCOPY  10/04/2006   jacobs  . neg hx    . UPPER GASTROINTESTINAL ENDOSCOPY  2015  . WISDOM TOOTH EXTRACTION      reports that he has never smoked. He has never used smokeless tobacco. He reports that he does not drink alcohol or use drugs. family history includes Arthritis in his other; Dementia in his mother; Heart failure in his father; Pneumonia in his father. No Known Allergies   Review of Systems  Constitutional: Negative for fatigue.  Eyes: Negative for visual disturbance.  Respiratory: Negative for cough, chest tightness and shortness of breath.   Cardiovascular: Negative for chest pain, palpitations and leg swelling.  Gastrointestinal: Negative for abdominal pain.   Endocrine: Negative for polydipsia and polyuria.  Genitourinary: Negative for dysuria.  Skin: Negative for rash.  Neurological: Negative for dizziness, syncope, weakness, light-headedness and headaches.       Objective:   Physical Exam  Constitutional: He is oriented to person, place, and time. He appears well-developed and well-nourished.  HENT:  Right Ear: External ear normal.  Left Ear: External ear normal.  Mouth/Throat: Oropharynx is clear and moist.  Eyes: Pupils are equal, round, and reactive to light.  Neck: Neck supple. No thyromegaly present.  Cardiovascular: Normal rate and regular rhythm.   Pulmonary/Chest: Effort normal and breath sounds normal. No respiratory distress. He has no wheezes. He has no rales.  Musculoskeletal: He exhibits no edema.  Neurological: He is alert and oriented to person, place, and time.       Assessment:     #1 hypertension stable and at goal  #2 hyperlipidemia  #3 history of recurrent depression followed by psychiatry on lithium and Zyprexa. Question bipolar disorder    Plan:     -We recommend he try to lose some weight. Increase walking and exercise -Check basic metabolic panel and TSH (on Lithium as above) -Flu vaccine given -Discussed new shingles vaccine and written prescription to get at pharmacy if he decides -Routine follow-up in 6 months and sooner as needed  [name] MD [facility]

## 2017-09-11 NOTE — Progress Notes (Addendum)
Subjective:   Jason [name] is a 76 y.o. male who presents for Medicare Annual/Subsequent preventive examination.  The Patient was informed that the wellness visit is to identify future health risk and educate and initiate measures that can reduce risk for increased disease through the lifespan.    Annual Wellness Assessment  Reports health as good   Preventive Screening -Counseling & Management  Medicare Annual Preventive Care Visit - Subsequent Last OV 08/2017   Health Maintenance Due  Topic Date Due  . COLONOSCOPY  10/04/2016    PSA 07/2016 Colonoscopy is scheduled this week on Friday    VS reviewed;   Diet - recommended lose weight  Ice cream and milkshakes Generally 2 meals Tries not to eat beef Likes fish, chicken;  Eats around lunch;  Can eat yogurt  Eats out a lot   BMI 28   Exercise Was walking and was a runner Played basketball until his 63 Run until his 60 Didn't like the gym  Can do the 2 mile loop [place] 62;    Dental  Stressors:  Dr. 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  Tobacco History  Smoking Status  . Never Smoker  Smokeless Tobacco  . Never Used     Counseling given: Yes   Past Medical History:  Diagnosis Date  . Anxiety   . Atrial fibrillation ([place])    past hx  . Colon polyp   . Depression   . GERD (gastroesophageal reflux disease)   . Gout, unspecified   . Hypertension     . Osteoarthrosis, unspecified whether generalized or localized, unspecified site   . Pneumonia, organism unspecified(486)   . Pure hypercholesterolemia   . Routine general medical examination at a health care facility   . Sleep apnea    not using cpap currently  . Unspecified nonpsychotic mental disorder    Past Surgical History:  Procedure Laterality Date  . COLONOSCOPY  10/04/2006   jacobs  . neg hx    . UPPER GASTROINTESTINAL ENDOSCOPY  2015  . WISDOM TOOTH EXTRACTION     Family History  Problem Relation Age of Onset  . Dementia Mother   . Pneumonia Father   . Heart failure Father   . Arthritis Other   . Colon cancer Neg Hx   . Colon polyps Neg Hx   . Esophageal cancer Neg Hx   . Stomach cancer Neg Hx   . Rectal cancer Neg Hx    History  Sexual Activity  . Sexual activity: Not on file    Outpatient Encounter Prescriptions as of 09/12/2017  Medication Sig  . amLODipine (NORVASC) 5 MG tablet TAKE 1 TABLET (5 MG TOTAL) BY MOUTH DAILY.  Marland Kitchen aspirin 81 MG tablet Take 81 mg by mouth daily.    . Biotin 10000 MCG TABS Take by mouth  daily. 1-2 a day  . bisacodyl (DULCOLAX) 5 MG EC tablet Take 5 mg by mouth once. X 4 on 9-19 for 2 day prep  . L-Methylfolate-B12-B6-B2 (CEREFOLIN PO) Take 1 capsule by mouth daily.  Marland Kitchen lithium carbonate 300 MG capsule Take 300 mg by mouth. Takes 2 tablets once per day with dinner.  . meloxicam (MOBIC) 15 MG tablet Take 15 mg by mouth as needed for pain.  . Methylfol-Algae-B12-Acetylcyst (CEREFOLIN NAC) 6-90.314-2-600 MG TABS Take 1 tablet by mouth daily.  . Multiple Vitamins-Minerals (MULTIVITAMIN & MINERAL PO) Take 1 tablet by mouth daily  . OLANZapine (ZYPREXA) 2.5 MG tablet Take 2.5 mg by mouth at bedtime.   . polyethylene glycol powder (MIRALAX) powder Take 1 Container by mouth once. 119 grams for colon 2 day prep on 9-21- to drink 9-19  . Probiotic Product (PROBIOTIC DAILY PO) Take 1 tablet by mouth every other day.  . sertraline (ZOLOFT) 100  MG tablet Take 200 mg by mouth daily.   . simvastatin (ZOCOR) 20 MG tablet TAKE 1 TABLET DAILY  . Vitamin D, Ergocalciferol, (DRISDOL) 50000 units CAPS capsule TAKE 1 CAPSULE EVERY 7 DAYS   No facility-administered encounter medications on file as of 09/12/2017.     Activities of Daily Living In your present state of health, do you have any difficulty performing the following activities: 09/12/2017  Hearing? N  Vision? N  Difficulty concentrating or making decisions? N  Walking or climbing stairs? N  Dressing or bathing? N  Doing errands, shopping? N  Preparing Food and eating ? N  Using the Toilet? N  In the past six months, have you accidently leaked urine? N  Do you have problems with loss of bowel control? Y  Comment somes constipation  Managing your Medications? N  Managing your Finances? N  Housekeeping or managing your Housekeeping? N  Some recent data might be hidden    Patient Care Team: [name], MD as PCP - General (Family Medicine)   Assessment:     Exercise Activities and Dietary recommendations Current Exercise Habits: Home exercise routine (to develop one this year )  Goals    . exercise          Will start walking regularly with wife if possible       Fall Risk Fall Risk  09/12/2017 01/19/2015 07/09/2013  Falls in the past year? Yes No No  Comment came in the front door and tripped; hit sheet rock wiht shoulder  - -   Depression Screen PHQ 2/9 Scores 09/12/2017 01/19/2015 07/09/2013  PHQ - 2 Score 0 0 0    Cognitive Function MMSE - Mini Mental State Exam 09/12/2017  Orientation to time 5  Orientation to Place 5  Registration 3  Attention/ Calculation 5  Recall 3  Language- name 2 objects 2  Language- repeat 1  Language- follow 3 step command 3  Language- read & follow direction 1  Write a sentence 1  Copy design 1  Total score 30       Immunization History  Administered Date(s) Administered  . H1N1 12/24/2008  . Influenza Split  11/14/2011, 10/05/2012  . Influenza Whole 10/15/2008, 11/20/2009, 12/10/2010  . Influenza, High Dose Seasonal PF 11/27/2015, 11/29/2016, 09/05/2017  . Influenza,inj,Quad PF,6+ Mos 09/26/2013, 10/23/2014  . Pneumococcal Conjugate-13 07/17/2014  . Pneumococcal Polysaccharide-23 07/20/2015  . Tdap 01/19/2015   Screening Tests Health Maintenance  Topic Date Due  . COLONOSCOPY  10/04/2016  . TETANUS/TDAP  01/19/2025  . INFLUENZA  VACCINE  Completed  . PNA vac Low Risk Adult  Completed      Plan:     PCP Notes   Health Maintenance Colonoscopy scheduled for Friday States he has rx for shingrix   Discussed Advanced Directive and agreed to complete   Abnormal Screens  MMSE normal; a couple of word finding issues   Referrals  To have colonoscopy this Friday Mental health followed by psych; feels he is doing well now   Patient concerns; States he will make apt with ENT to check on ears and see audiologist while there for a hearing screen  Nurse Concerns; As noted   Next PCP apt March 06, 2018        I have personally reviewed and noted the following in the patient's chart:   . Medical and social history . Use of alcohol, tobacco or illicit drugs  . Current medications and supplements . Functional ability and status . Nutritional status . Physical activity . Advanced directives . List of other physicians . Hospitalizations, surgeries, and ER visits in previous 12 months . Vitals . Screenings to include cognitive, depression, and falls . Referrals and appointments  In addition, I have reviewed and discussed with patient certain preventive protocols, quality metrics, and best practice recommendations. A written personalized care plan for preventive services as well as general preventive health recommendations were provided to patient.     WFUXN,ATFTD, RN  09/12/2017  Agree with assessment as above.  [name] MD [facility]

## 2017-09-12 ENCOUNTER — Ambulatory Visit (INDEPENDENT_AMBULATORY_CARE_PROVIDER_SITE_OTHER): Payer: Medicare Other

## 2017-09-12 VITALS — BP 120/80 | HR 76 | Ht 74.0 in | Wt 217.0 lb

## 2017-09-12 DIAGNOSIS — Z0000 Encounter for general adult medical examination without abnormal findings: Secondary | ICD-10-CM

## 2017-09-12 NOTE — Patient Instructions (Addendum)
Jason [name] , Thank you for taking time to come for your Medicare Wellness Visit. I appreciate your ongoing commitment to your health goals. Please review the following plan we discussed and let me know if I can assist you in the future.   Will have your colonoscopy this week  Will work on [employer]  Will try to complete AD; Given copy  Referred to [facility] for questions [place] offers free advance directive forms, as well as assistance in completing the forms themselves. For assistance, contact the Spiritual Care Department at [phone], or the Clinical Social Work Department at [phone].  Shingrix is a vaccine for the prevention of Shingles in Adults 50 and older.  If you are on Medicare, you can request a prescription from your doctor to be filled at a pharmacy.  Please check with your benefits regarding applicable copays or out of pocket expenses.  The Shingrix is given in 2 vaccines approx 8 weeks apart. You must receive the 2nd dose prior to 6 months from receipt of the first.    These are the goals we discussed: Goals    . exercise          Will start walking regularly with wife if possible        This is a list of the screening recommended for you and due dates:  Health Maintenance  Topic Date Due  . Colon Cancer Screening  10/04/2016  . Tetanus Vaccine  01/19/2025  . Flu Shot  Completed  . Pneumonia vaccines  Completed   Prevention of falls: Remove rugs or any tripping hazards in the home Use Non slip mats in bathtubs and showers Placing grab bars next to the toilet and or shower Placing handrails on both sides of the stair way Adding extra lighting in the home.   Personal safety issues reviewed:  1. Consider starting a community watch program per [facility] 2.  Changes batteries is smoke detector and/or carbon monoxide detector  3.  If you have firearms; keep them in a safe place 4.  Wear protection when in the sun; Always wear  sunscreen or a hat; It is good to have your doctor check your skin annually or review any new areas of concern 5. Driving safety; Keep in the right lane; stay 3 car lengths behind the car in front of you on the highway; look 3 times prior to pulling out; carry your cell phone everywhere you go!    Learn about the Yellow Dot program:  The program allows first responders at your emergency to have access to who your physician is, as well as your medications and medical conditions.  Citizens requesting the Yellow Dot Packages should contact Master Corporal Nunzio Cobbs at the [facility] [phone] for the first week of the program and beginning the week after Easter citizens should contact their Scientist, physiological.    Fall Prevention in the Home Falls can cause injuries and can affect people from all age groups. There are many simple things that you can do to make your home safe and to help prevent falls. What can I do on the outside of my home?  Regularly repair the edges of walkways and driveways and fix any cracks.  Remove high doorway thresholds.  Trim any shrubbery on the main path into your home.  Use bright outdoor lighting.  Clear walkways of debris and clutter, including tools and rocks.  Regularly check that handrails are securely fastened and  in good repair. Both sides of any steps should have handrails.  Install guardrails along the edges of any raised decks or porches.  Have leaves, snow, and ice cleared regularly.  Use sand or salt on walkways during winter months.  In the garage, clean up any spills right away, including grease or oil spills. What can I do in the bathroom?  Use night lights.  Install grab bars by the toilet and in the tub and shower. Do not use towel bars as grab bars.  Use non-skid mats or decals on the floor of the tub or shower.  If you need to sit down while you are in the shower, use a plastic, non-slip  stool.  Keep the floor dry. Immediately clean up any water that spills on the floor.  Remove soap buildup in the tub or shower on a regular basis.  Attach bath mats securely with double-sided non-slip rug tape.  Remove throw rugs and other tripping hazards from the floor. What can I do in the bedroom?  Use night lights.  Make sure that a bedside light is easy to reach.  Do not use oversized bedding that drapes onto the floor.  Have a firm chair that has side arms to use for getting dressed.  Remove throw rugs and other tripping hazards from the floor. What can I do in the kitchen?  Clean up any spills right away.  Avoid walking on wet floors.  Place frequently used items in easy-to-reach places.  If you need to reach for something above you, use a sturdy step stool that has a grab bar.  Keep electrical cables out of the way.  Do not use floor polish or wax that makes floors slippery. If you have to use wax, make sure that it is non-skid floor wax.  Remove throw rugs and other tripping hazards from the floor. What can I do in the stairways?  Do not leave any items on the stairs.  Make sure that there are handrails on both sides of the stairs. Fix handrails that are broken or loose. Make sure that handrails are as long as the stairways.  Check any carpeting to make sure that it is firmly attached to the stairs. Fix any carpet that is loose or worn.  Avoid having throw rugs at the top or bottom of stairways, or secure the rugs with carpet tape to prevent them from moving.  Make sure that you have a light switch at the top of the stairs and the bottom of the stairs. If you do not have them, have them installed. What are some other fall prevention tips?  Wear closed-toe shoes that fit well and support your feet. Wear shoes that have rubber soles or low heels.  When you use a stepladder, make sure that it is completely opened and that the sides are firmly locked. Have  someone hold the ladder while you are using it. Do not climb a closed stepladder.  Add color or contrast paint or tape to grab bars and handrails in your home. Place contrasting color strips on the first and last steps.  Use mobility aids as needed, such as canes, walkers, scooters, and crutches.  Turn on lights if it is dark. Replace any light bulbs that burn out.  Set up furniture so that there are clear paths. Keep the furniture in the same spot.  Fix any uneven floor surfaces.  Choose a carpet design that does not hide the edge of steps of a  stairway.  Be aware of any and all pets.  Review your medicines with your healthcare provider. Some medicines can cause dizziness or changes in blood pressure, which increase your risk of falling. Talk with your health care provider about other ways that you can decrease your risk of falls. This may include working with a physical therapist or trainer to improve your strength, balance, and endurance. This information is not intended to replace advice given to you by your health care provider. Make sure you discuss any questions you have with your health care provider. Document Released: 12/02/2002 Document Revised: 05/10/2016 Document Reviewed: 01/16/2015 Elsevier Interactive Patient Education  2017 [place] Maintenance, Male A healthy lifestyle and preventive care is important for your health and wellness. Ask your health care provider about what schedule of regular examinations is right for you. What should I know about weight and diet? Eat a Healthy Diet  Eat plenty of vegetables, fruits, whole grains, low-fat dairy products, and lean protein.  Do not eat a lot of foods high in solid fats, added sugars, or salt.  Maintain a Healthy Weight Regular exercise can help you achieve or maintain a healthy weight. You should:  Do at least 150 minutes of exercise each week. The exercise should increase your heart rate and make you  sweat (moderate-intensity exercise).  Do strength-training exercises at least twice a week.  Watch Your Levels of Cholesterol and Blood Lipids  Have your blood tested for lipids and cholesterol every 5 years starting at 74 years of age. If you are at high risk for heart disease, you should start having your blood tested when you are 74 years old. You may need to have your cholesterol levels checked more often if: ? Your lipid or cholesterol levels are high. ? You are older than 74 years of age. ? You are at high risk for heart disease.  What should I know about cancer screening? Many types of cancers can be detected early and may often be prevented. Lung Cancer  You should be screened every year for lung cancer if: ? You are a current smoker who has smoked for at least 30 years. ? You are a former smoker who has quit within the past 15 years.  Talk to your health care provider about your screening options, when you should start screening, and how often you should be screened.  Colorectal Cancer  Routine colorectal cancer screening usually begins at 74 years of age and should be repeated every 5-10 years until you are 74 years old. You may need to be screened more often if early forms of precancerous polyps or small growths are found. Your health care provider may recommend screening at an earlier age if you have risk factors for colon cancer.  Your health care provider may recommend using home test kits to check for hidden blood in the stool.  A small camera at the end of a tube can be used to examine your colon (sigmoidoscopy or colonoscopy). This checks for the earliest forms of colorectal cancer.  Prostate and Testicular Cancer  Depending on your age and overall health, your health care provider may do certain tests to screen for prostate and testicular cancer.  Talk to your health care provider about any symptoms or concerns you have about testicular or prostate cancer.  Skin  Cancer  Check your skin from head to toe regularly.  Tell your health care provider about any new moles or changes in moles,  especially if: ? There is a change in a mole's size, shape, or color. ? You have a mole that is larger than a pencil eraser.  Always use sunscreen. Apply sunscreen liberally and repeat throughout the day.  Protect yourself by wearing long sleeves, pants, a wide-brimmed hat, and sunglasses when outside.  What should I know about heart disease, diabetes, and high blood pressure?  If you are 44-61 years of age, have your blood pressure checked every 3-5 years. If you are 35 years of age or older, have your blood pressure checked every year. You should have your blood pressure measured twice-once when you are at a hospital or clinic, and once when you are not at a hospital or clinic. Record the average of the two measurements. To check your blood pressure when you are not at a hospital or clinic, you can use: ? An automated blood pressure machine at a pharmacy. ? A home blood pressure monitor.  Talk to your health care provider about your target blood pressure.  If you are between 37-53 years old, ask your health care provider if you should take aspirin to prevent heart disease.  Have regular diabetes screenings by checking your fasting blood sugar level. ? If you are at a normal weight and have a low risk for diabetes, have this test once every three years after the age of 56. ? If you are overweight and have a high risk for diabetes, consider being tested at a younger age or more often.  A one-time screening for abdominal aortic aneurysm (AAA) by ultrasound is recommended for men aged 41-75 years who are current or former smokers. What should I know about preventing infection? Hepatitis B If you have a higher risk for hepatitis B, you should be screened for this virus. Talk with your health care provider to find out if you are at risk for hepatitis B  infection. Hepatitis C Blood testing is recommended for:  Everyone born from [birth year] through 1963.  Anyone with known risk factors for hepatitis C.  Sexually Transmitted Diseases (STDs)  You should be screened each year for STDs including gonorrhea and chlamydia if: ? You are sexually active and are younger than 74 years of age. ? You are older than 74 years of age and your health care provider tells you that you are at risk for this type of infection. ? Your sexual activity has changed since you were last screened and you are at an increased risk for chlamydia or gonorrhea. Ask your health care provider if you are at risk.  Talk with your health care provider about whether you are at high risk of being infected with HIV. Your health care provider may recommend a prescription medicine to help prevent HIV infection.  What else can I do?  Schedule regular health, dental, and eye exams.  Stay current with your vaccines (immunizations).  Do not use any tobacco products, such as cigarettes, chewing tobacco, and e-cigarettes. If you need help quitting, ask your health care provider.  Limit alcohol intake to no more than 2 drinks per day. One drink equals 12 ounces of beer, 5 ounces of wine, or 1 ounces of hard liquor.  Do not use street drugs.  Do not share needles.  Ask your health care provider for help if you need support or information about quitting drugs.  Tell your health care provider if you often feel depressed.  Tell your health care provider if you have ever been abused or do  not feel safe at home. This information is not intended to replace advice given to you by your health care provider. Make sure you discuss any questions you have with your health care provider. Document Released: 06/09/2008 Document Revised: 08/10/2016 Document Reviewed: 09/15/2015 Elsevier Interactive Patient Education  2018 Elsevier Inc.  

## 2017-09-14 ENCOUNTER — Encounter: Payer: Self-pay | Admitting: Family Medicine

## 2017-09-15 ENCOUNTER — Ambulatory Visit (AMBULATORY_SURGERY_CENTER): Payer: Medicare Other | Admitting: Gastroenterology

## 2017-09-15 ENCOUNTER — Encounter: Payer: Self-pay | Admitting: Gastroenterology

## 2017-09-15 VITALS — BP 132/85 | HR 71 | Temp 98.0°F | Resp 22 | Ht 74.5 in | Wt 213.0 lb

## 2017-09-15 DIAGNOSIS — D122 Benign neoplasm of ascending colon: Secondary | ICD-10-CM

## 2017-09-15 DIAGNOSIS — D123 Benign neoplasm of transverse colon: Secondary | ICD-10-CM

## 2017-09-15 DIAGNOSIS — Z1211 Encounter for screening for malignant neoplasm of colon: Secondary | ICD-10-CM | POA: Diagnosis not present

## 2017-09-15 DIAGNOSIS — Z86010 Personal history of colonic polyps: Secondary | ICD-10-CM | POA: Diagnosis not present

## 2017-09-15 DIAGNOSIS — G4733 Obstructive sleep apnea (adult) (pediatric): Secondary | ICD-10-CM | POA: Diagnosis not present

## 2017-09-15 DIAGNOSIS — F319 Bipolar disorder, unspecified: Secondary | ICD-10-CM | POA: Diagnosis not present

## 2017-09-15 DIAGNOSIS — I10 Essential (primary) hypertension: Secondary | ICD-10-CM | POA: Diagnosis not present

## 2017-09-15 MED ORDER — SODIUM CHLORIDE 0.9 % IV SOLN: 500.0000 mL | INTRAVENOUS | Status: DC

## 2017-09-15 NOTE — Op Note (Signed)
[facility] Patient Name: Jason [name] Procedure Date: 09/15/2017 9:03 AM MRN: [id number] Endoscopist: Milus Banister , MD Age: 76 Referring MD:  Date of Birth: 09-Nov-1941 Gender: Male Account #: [id number] Procedure:                Colonoscopy Indications:              Screening for colorectal malignant neoplasm Medicines:                Monitored Anesthesia Care Procedure:                Pre-Anesthesia Assessment:                           - Prior to the procedure, a History and Physical                            was performed, and patient medications and                            allergies were reviewed. The patient's tolerance of                            previous anesthesia was also reviewed. The risks                            and benefits of the procedure and the sedation                            options and risks were discussed with the patient.                            All questions were answered, and informed consent                            was obtained. Prior Anticoagulants: The patient has                            taken no previous anticoagulant or antiplatelet                            agents. ASA Grade Assessment: II - A patient with                            mild systemic disease. After reviewing the risks                            and benefits, the patient was deemed in                            satisfactory condition to undergo the procedure.                           After obtaining informed consent, the colonoscope  was passed under direct vision. Throughout the                            procedure, the patient's blood pressure, pulse, and                            oxygen saturations were monitored continuously. The                            Colonoscope was introduced through the anus and                            advanced to the the cecum, identified by                            appendiceal orifice and  ileocecal valve. The                            colonoscopy was performed without difficulty. The                            patient tolerated the procedure well. The quality                            of the bowel preparation was good. The ileocecal                            valve, appendiceal orifice, and rectum were                            photographed. Scope In: 9:12:55 AM Scope Out: 9:24:24 AM Scope Withdrawal Time: 0 hours 9 minutes 29 seconds  Total Procedure Duration: 0 hours 11 minutes 29 seconds  Findings:                 Four sessile polyps were found in the transverse                            colon and ascending colon. The polyps were 2 to 7                            mm in size. These polyps were removed with a cold                            snare. Resection and retrieval were complete.                           The exam was otherwise without abnormality on                            direct and retroflexion views. Complications:            No immediate complications. Estimated blood loss:  None. Estimated Blood Loss:     Estimated blood loss: none. Impression:               - Four 2 to 7 mm polyps in the transverse colon and                            in the ascending colon, removed with a cold snare.                            Resected and retrieved.                           - The examination was otherwise normal on direct                            and retroflexion views. Recommendation:           - Patient has a contact number available for                            emergencies. The signs and symptoms of potential                            delayed complications were discussed with the                            patient. Return to normal activities tomorrow.                            Written discharge instructions were provided to the                            patient.                           - Resume previous diet.                            - Continue present medications.                           You will receive a letter within 2-3 weeks with the                            pathology results and my final recommendations.                           If the polyp(s) is proven to be 'pre-cancerous' on                            pathology, you will need repeat colonoscopy in 3-5                            years. Milus Banister, MD 09/15/2017 9:26:41 AM This report has been signed electronically.

## 2017-09-15 NOTE — Progress Notes (Signed)
Pt's states no medical or surgical changes since previsit or office visit.   Pt. Also states that he has never been "diagnosed with Atrial fibrillation".  He has never been on blood thinner.

## 2017-09-15 NOTE — Progress Notes (Signed)
Called to room to assist during endoscopic procedure.  Patient ID and intended procedure confirmed with present staff. Received instructions for my participation in the procedure from the performing physician.  

## 2017-09-15 NOTE — Patient Instructions (Signed)
YOU HAD AN ENDOSCOPIC PROCEDURE TODAY AT [facility]:   Refer to the procedure report that was given to you for any specific questions about what was found during the examination.  If the procedure report does not answer your questions, please call your gastroenterologist to clarify.  If you requested that your care partner not be given the details of your procedure findings, then the procedure report has been included in a sealed envelope for you to review at your convenience later.  YOU SHOULD EXPECT: Some feelings of bloating in the abdomen. Passage of more gas than usual.  Walking can help get rid of the air that was put into your GI tract during the procedure and reduce the bloating. If you had a lower endoscopy (such as a colonoscopy or flexible sigmoidoscopy) you may notice spotting of blood in your stool or on the toilet paper. If you underwent a bowel prep for your procedure, you may not have a normal bowel movement for a few days.  Please Note:  You might notice some irritation and congestion in your nose or some drainage.  This is from the oxygen used during your procedure.  There is no need for concern and it should clear up in a day or so.  SYMPTOMS TO REPORT IMMEDIATELY:   Following lower endoscopy (colonoscopy or flexible sigmoidoscopy):  Excessive amounts of blood in the stool  Significant tenderness or worsening of abdominal pains  Swelling of the abdomen that is new, acute  Fever of 100F or higher For urgent or emergent issues, a gastroenterologist can be reached at any hour by calling [phone].   DIET:  We do recommend a small meal at first, but then you may proceed to your regular diet.  Drink plenty of fluids but you should avoid alcoholic beverages for 24 hours.  ACTIVITY:  You should plan to take it easy for the rest of today and you should NOT DRIVE or use heavy machinery until tomorrow (because of the sedation medicines used during the test).     FOLLOW UP: Our staff will call the number listed on your records the next business day following your procedure to check on you and address any questions or concerns that you may have regarding the information given to you following your procedure. If we do not reach you, we will leave a message.  However, if you are feeling well and you are not experiencing any problems, there is no need to return our call.  We will assume that you have returned to your regular daily activities without incident.  If any biopsies were taken you will be contacted by phone or by letter within the next 1-3 weeks.  Please call us at [phone] if you have not heard about the biopsies in 3 weeks.   SIGNATURES/CONFIDENTIALITY: You and/or your care partner have signed paperwork which will be entered into your electronic medical record.  These signatures attest to the fact that that the information above on your After Visit Summary has been reviewed and is understood.  Full responsibility of the confidentiality of this discharge information lies with you and/or your care-partner.  Await pathology  Continue your normal medications  Please read handout about polyps

## 2017-09-15 NOTE — Progress Notes (Signed)
To recovery, report to RN, VSS. 

## 2017-09-18 ENCOUNTER — Telehealth: Payer: Self-pay

## 2017-09-18 NOTE — Telephone Encounter (Signed)
  Follow up Call-  Call Jason [name] number 09/15/2017  Post procedure Call Jason [name] phone  # [phone]  Permission to leave phone message Yes  Some recent data might be hidden     Patient questions:  Do you have a fever, pain , or abdominal swelling? No. Pain Score  0 *  Have you tolerated food without any problems? Yes.    Have you been able to return to your normal activities? Yes.    Do you have any questions about your discharge instructions: Diet   No. Medications  No. Follow up visit  No.  Do you have questions or concerns about your Care? No.  Actions: * If pain score is 4 or above: No action needed, pain <4.

## 2017-09-20 ENCOUNTER — Encounter: Payer: Self-pay | Admitting: Gastroenterology

## 2017-09-29 ENCOUNTER — Other Ambulatory Visit: Payer: Self-pay | Admitting: Family Medicine

## 2017-10-12 ENCOUNTER — Other Ambulatory Visit: Payer: Self-pay | Admitting: Family Medicine

## 2017-10-24 ENCOUNTER — Other Ambulatory Visit: Payer: Self-pay | Admitting: Family Medicine

## 2017-11-23 ENCOUNTER — Other Ambulatory Visit: Payer: Self-pay | Admitting: Family Medicine

## 2018-01-01 ENCOUNTER — Other Ambulatory Visit: Payer: Self-pay | Admitting: Family Medicine

## 2018-02-12 DIAGNOSIS — F3341 Major depressive disorder, recurrent, in partial remission: Secondary | ICD-10-CM | POA: Diagnosis not present

## 2018-02-13 DIAGNOSIS — F332 Major depressive disorder, recurrent severe without psychotic features: Secondary | ICD-10-CM | POA: Diagnosis not present

## 2018-03-06 ENCOUNTER — Encounter: Payer: Self-pay | Admitting: Family Medicine

## 2018-03-06 ENCOUNTER — Ambulatory Visit (INDEPENDENT_AMBULATORY_CARE_PROVIDER_SITE_OTHER): Payer: Medicare Other | Admitting: Family Medicine

## 2018-03-06 VITALS — BP 126/72 | HR 83 | Temp 97.6°F | Wt 217.9 lb

## 2018-03-06 DIAGNOSIS — E7800 Pure hypercholesterolemia, unspecified: Secondary | ICD-10-CM

## 2018-03-06 DIAGNOSIS — I10 Essential (primary) hypertension: Secondary | ICD-10-CM | POA: Diagnosis not present

## 2018-03-06 LAB — HEPATIC FUNCTION PANEL
ALT: 20 U/L (ref 0–53)
AST: 21 U/L (ref 0–37)
Albumin: 4.4 g/dL (ref 3.5–5.2)
Alkaline Phosphatase: 67 U/L (ref 39–117)
BILIRUBIN DIRECT: 0.1 mg/dL (ref 0.0–0.3)
TOTAL PROTEIN: 6.8 g/dL (ref 6.0–8.3)
Total Bilirubin: 0.6 mg/dL (ref 0.2–1.2)

## 2018-03-06 LAB — LIPID PANEL
CHOLESTEROL: 141 mg/dL (ref 0–200)
HDL: 49.2 mg/dL (ref >39.00)
LDL Cholesterol: 76 mg/dL (ref 0–99)
NonHDL: 91.49
TRIGLYCERIDES: 78 mg/dL (ref 0.0–149.0)
Total CHOL/HDL Ratio: 3
VLDL: 15.6 mg/dL (ref 0.0–40.0)

## 2018-03-06 NOTE — Progress Notes (Signed)
Subjective:     Patient ID: Jason [name], male   DOB: 04-Jan-1941, 77 y.o.   MRN: [id number]  HPI Patient seen for medical follow-up. History of hypertension, obstructive sleep apnea, hyperlipidemia. No longer uses CPAP. He states he felt this never seemed to help much. He takes amlodipine 5 mg daily for hypertension. Blood pressures been controlled. No dizziness. No headaches. Hyperlipidemia treated with simvastatin. No myalgias. He has history of recurrent depression and is followed by psychiatry and treated with sertraline and lithium. He gets lithium levels per psychiatry. He had TSH done last September which was stable.  He's had occasional burning type sensation across his anterior chest bilaterally. Symptoms occur at rest and very infrequent. Never with exertion. No heaviness or tightness. No radiation. No GERD symptoms. No swallowing difficulties.Marland Kitchen Appetite and weight are stable.  Past Medical History:  Diagnosis Date  . Anxiety   . Atrial fibrillation ([place])    past hx  . Colon polyp   . Depression   . GERD (gastroesophageal reflux disease)   . Gout, unspecified   . Hypertension   . Osteoarthrosis, unspecified whether generalized or localized, unspecified site   . Pneumonia, organism unspecified(486)   . Pure hypercholesterolemia   . Routine general medical examination at a health care facility   . Sleep apnea    not using cpap currently  . Unspecified nonpsychotic mental disorder    Past Surgical History:  Procedure Laterality Date  . COLONOSCOPY  10/04/2006   jacobs  . neg hx    . UPPER GASTROINTESTINAL ENDOSCOPY  2015  . WISDOM TOOTH EXTRACTION      reports that  has never smoked. he has never used smokeless tobacco. He reports that he does not drink alcohol or use drugs. family history includes Arthritis in his other; Dementia in his mother; Heart failure in his father; Pneumonia in his father. No Known Allergies   Review of Systems  Constitutional: Negative for  fatigue.  Eyes: Negative for visual disturbance.  Respiratory: Negative for cough, chest tightness and shortness of breath.   Cardiovascular: Negative for chest pain, palpitations and leg swelling.  Endocrine: Negative for polydipsia and polyuria.  Neurological: Negative for dizziness, syncope, weakness, light-headedness and headaches.       Objective:   Physical Exam  Constitutional: He appears well-developed and well-nourished.  Neck: Neck supple.  Cardiovascular: Normal rate and regular rhythm.  Pulmonary/Chest: Effort normal and breath sounds normal. No respiratory distress. He has no wheezes. He has no rales.  Musculoskeletal: He exhibits no edema.  Neurological: He is alert.  Psychiatric: He has a normal mood and affect. His behavior is normal.       Assessment:     #1 hypertension stable and at goal  #2 hyperlipidemia on simvastatin  #3 history of recurrent depression followed by psychiatry    Plan:     -Recheck labs with lipid panel and hepatic panel. -Needs at least yearly TSH with his lithium therapy. Recent electrolytes normal. -Discuss new shingles vaccine. He will check with local pharmacy regarding availability. -Routine follow-up in 6 months and sooner as needed  [name] MD [facility] at [facility]

## 2018-03-23 ENCOUNTER — Other Ambulatory Visit: Payer: Self-pay | Admitting: Family Medicine

## 2018-03-23 NOTE — Telephone Encounter (Signed)
Refills OK for 6 months. 

## 2018-03-26 NOTE — Telephone Encounter (Signed)
Rx done. 

## 2018-04-23 ENCOUNTER — Other Ambulatory Visit: Payer: Self-pay | Admitting: Family Medicine

## 2018-05-10 DIAGNOSIS — H43813 Vitreous degeneration, bilateral: Secondary | ICD-10-CM | POA: Diagnosis not present

## 2018-06-20 ENCOUNTER — Other Ambulatory Visit: Payer: Self-pay | Admitting: Family Medicine

## 2018-07-02 DIAGNOSIS — H2513 Age-related nuclear cataract, bilateral: Secondary | ICD-10-CM | POA: Diagnosis not present

## 2018-07-02 DIAGNOSIS — H524 Presbyopia: Secondary | ICD-10-CM | POA: Diagnosis not present

## 2018-07-12 DIAGNOSIS — F3170 Bipolar disorder, currently in remission, most recent episode unspecified: Secondary | ICD-10-CM | POA: Diagnosis not present

## 2018-07-17 DIAGNOSIS — F332 Major depressive disorder, recurrent severe without psychotic features: Secondary | ICD-10-CM | POA: Diagnosis not present

## 2018-07-22 ENCOUNTER — Other Ambulatory Visit: Payer: Self-pay | Admitting: Family Medicine

## 2018-09-03 ENCOUNTER — Ambulatory Visit (INDEPENDENT_AMBULATORY_CARE_PROVIDER_SITE_OTHER): Payer: Medicare Other | Admitting: Family Medicine

## 2018-09-03 ENCOUNTER — Encounter: Payer: Self-pay | Admitting: Family Medicine

## 2018-09-03 VITALS — BP 110/70 | HR 78 | Temp 98.2°F | Wt 219.3 lb

## 2018-09-03 DIAGNOSIS — I10 Essential (primary) hypertension: Secondary | ICD-10-CM

## 2018-09-03 DIAGNOSIS — Z79899 Other long term (current) drug therapy: Secondary | ICD-10-CM

## 2018-09-03 DIAGNOSIS — Z23 Encounter for immunization: Secondary | ICD-10-CM

## 2018-09-03 DIAGNOSIS — Z125 Encounter for screening for malignant neoplasm of prostate: Secondary | ICD-10-CM | POA: Diagnosis not present

## 2018-09-03 DIAGNOSIS — Z9181 History of falling: Secondary | ICD-10-CM | POA: Diagnosis not present

## 2018-09-03 LAB — BASIC METABOLIC PANEL
BUN: 16 mg/dL (ref 6–23)
CO2: 26 meq/L (ref 19–32)
Calcium: 10 mg/dL (ref 8.4–10.5)
Chloride: 105 mEq/L (ref 96–112)
Creatinine, Ser: 1.09 mg/dL (ref 0.40–1.50)
GFR: 70.08 mL/min (ref >60.00)
GLUCOSE: 87 mg/dL (ref 70–99)
Potassium: 4.5 mEq/L (ref 3.5–5.1)
SODIUM: 138 meq/L (ref 135–145)

## 2018-09-03 LAB — PSA, MEDICARE: PSA: 0.8 ng/mL (ref 0.10–4.00)

## 2018-09-03 LAB — TSH: TSH: 3.95 u[IU]/mL (ref 0.35–4.50)

## 2018-09-03 NOTE — Patient Instructions (Signed)
Consider stop aspirin use.

## 2018-09-03 NOTE — Progress Notes (Signed)
  Subjective:     Patient ID: Jason [name], male   DOB: 08/27/1941, 77 y.o.   MRN: [id number]  HPI Patient seen for medical follow-up. He has history of hypertension, mild hyperlipidemia, history of recurrent depression. He is followed by psychiatry. He is maintained on lithium. No history of bipolar disorder. He is due for follow-up renal function and thyroid because of his lithium. Psychiatrist monitors lithium levels but apparently has not had thyroid functions or renal function recently. He had lipids last visit.  Blood pressure been stable. No recent chest pain. He's had a couple falls. Feels somewhat unsteady with gait. No head injury. Vision stable. No neuropathy symptoms.  Past Medical History:  Diagnosis Date  . Anxiety   . Atrial fibrillation ([place])    past hx  . Colon polyp   . Depression   . GERD (gastroesophageal reflux disease)   . Gout, unspecified   . Hypertension   . Osteoarthrosis, unspecified whether generalized or localized, unspecified site   . Pneumonia, organism unspecified(486)   . Pure hypercholesterolemia   . Routine general medical examination at a health care facility   . Sleep apnea    not using cpap currently  . Unspecified nonpsychotic mental disorder    Past Surgical History:  Procedure Laterality Date  . COLONOSCOPY  10/04/2006   jacobs  . neg hx    . UPPER GASTROINTESTINAL ENDOSCOPY  2015  . WISDOM TOOTH EXTRACTION      reports that he has never smoked. He has never used smokeless tobacco. He reports that he does not drink alcohol or use drugs. family history includes Arthritis in his other; Dementia in his mother; Heart failure in his father; Pneumonia in his father. No Known Allergies   Review of Systems  Constitutional: Negative for fatigue and unexpected weight change.  Eyes: Negative for visual disturbance.  Respiratory: Negative for cough, chest tightness and shortness of breath.   Cardiovascular: Negative for chest pain, palpitations  and leg swelling.  Endocrine: Negative for polydipsia and polyuria.  Neurological: Negative for dizziness, syncope, weakness, light-headedness and headaches.  Psychiatric/Behavioral: Negative for confusion.       Objective:   Physical Exam  Constitutional: He is oriented to person, place, and time. He appears well-developed and well-nourished.  Cardiovascular: Normal rate and regular rhythm.  Pulmonary/Chest: Effort normal and breath sounds normal.  Musculoskeletal: He exhibits no edema.  Neurological: He is alert and oriented to person, place, and time. No cranial nerve deficit.  No focal weakness       Assessment:     #1 hypertension stable and at goal  #2 hyperlipidemia. Patient on simvastatin with no adverse side effects  #3 high-risk medication with lithium. Needs follow-up thyroid and renal function screen  #4 high-risk for falls  #5 Prostate cancer screening. Patient had lots of questions regarding ongoing screening    Plan:     -check labs with TSH and basic metabolic panel -Continue current medications -We recommended he consider some physical therapy to help with fall risk reduction -Routine follow-up in 6 months and sooner as needed -we discussed that based on [place] preventative task force recommendations would not recommend routine screening in a 77 year old male. We have very long discussion regarding pros and cons of screening and patient is requesting PSA  [name] MD [facility] at [facility]

## 2018-09-14 ENCOUNTER — Ambulatory Visit: Payer: Self-pay

## 2018-09-18 NOTE — Progress Notes (Addendum)
Subjective:   Jason [name] is a 77 y.o. male who presents for Medicare Annual/Subsequent preventive examination.  Reports health as Mental health followed by Psych Seen Dr. [name] 09/03/2018  2018 Diet - recommended lose weight  2019 chol/hdl 3 Mother and father in law have had acute incidents  Father in law broke his hip Takes care of sister   Diet Eating supper late 8 or 9 pm Generally 2 meals Breakfast- nothing Lunch; chicken salad sandwich  Supper goes out to eat Eat some fried but does not eat hamburgers  Sweets and ice cream  BMI 28 (27.4)   Exercise Was walking and was a runner Played basketball until his 60 Run until his 9 Didn't like the gym  Can do the 2 mile loop around [place] going  Lately  Will try to go back to exercise  Coached and planning exercise   Now volunteering at [employer]  May need to try other activities  Does have some fear of falling  does not sleep well at hs due to urinary frequency Will make apt with Dr. [name] to fup   There are no preventive care reminders to display for this patient.  PSA 08/2018  Colonoscopy 08/2017 - due 08/2020  Cardiac Risk Factors include: advanced age (>22men, >59 women);dyslipidemia;male gender;hypertension;family history of premature cardiovascular disease    Objective:    Vitals: BP 108/70   Pulse 71   Ht 6\' 3"  (1.905 m)   Wt 219 lb (99.3 kg)   SpO2 97%   BMI 27.37 kg/m   Body mass index is 27.37 kg/m.  Advanced Directives 09/19/2018 09/15/2017 09/12/2017 09/01/2017 08/21/2016 06/08/2016 08/17/2015  Does Patient Have a Medical Advance Directive? No No No No No No No  Would patient like information on creating a medical advance directive? - - - - No - patient declined information - -    Tobacco Social History   Tobacco Use  Smoking Status Never Smoker  Smokeless Tobacco Never Used     Counseling given: Not Answered   Clinical Intake:     Past  Medical History:  Diagnosis Date  . Anxiety   . Atrial fibrillation ([place])    past hx  . Colon polyp   . Depression   . GERD (gastroesophageal reflux disease)   . Gout, unspecified   . Hypertension   . Osteoarthrosis, unspecified whether generalized or localized, unspecified site   . Pneumonia, organism unspecified(486)   . Pure hypercholesterolemia   . Routine general medical examination at a health care facility   . Sleep apnea    not using cpap currently  . Unspecified nonpsychotic mental disorder    Past Surgical History:  Procedure Laterality Date  . COLONOSCOPY  10/04/2006   jacobs  . neg hx    . UPPER GASTROINTESTINAL ENDOSCOPY  2015  . WISDOM TOOTH EXTRACTION     Family History  Problem Relation Age of Onset  . Dementia Mother   . Pneumonia Father   . Heart failure Father   . Arthritis Other   . Colon cancer Neg Hx   . Colon polyps Neg Hx   . Esophageal cancer Neg Hx   . Stomach cancer Neg Hx   . Rectal cancer Neg Hx    Social History   Socioeconomic History  . Marital status: Married    Spouse name: [name]  . Number of children: 0  . Years of education: Not on file  . Highest  education level: Not on file  Occupational History  . Occupation: retired    Fish farm manager: [employer]  Social Needs  . Financial resource strain: Not on file  . Food insecurity:    Worry: Not on file    Inability: Not on file  . Transportation needs:    Medical: Not on file    Non-medical: Not on file  Tobacco Use  . Smoking status: Never Smoker  . Smokeless tobacco: Never Used  Substance and Sexual Activity  . Alcohol use: No  . Drug use: No  . Sexual activity: Not on file  Lifestyle  . Physical activity:    Days per week: Not on file    Minutes per session: Not on file  . Stress: Not on file  Relationships  . Social connections:    Talks on phone: Not on file    Gets together: Not on file    Attends religious service: Not on file    Active member of club or  organization: Not on file    Attends meetings of clubs or organizations: Not on file    Relationship status: Not on file  Other Topics Concern  . Not on file  Social History Narrative  . Not on file    Outpatient Encounter Medications as of 09/19/2018  Medication Sig  . amLODipine (NORVASC) 5 MG tablet TAKE 1 TABLET (5 MG TOTAL) BY MOUTH DAILY.  Marland Kitchen L-Methylfolate-B12-B6-B2 (CEREFOLIN PO) Take 1 capsule by mouth daily.  Marland Kitchen lithium carbonate 300 MG capsule Take 600 mg by mouth. Takes 2 tablets once per day with dinner.  . meloxicam (MOBIC) 15 MG tablet Take 15 mg by mouth as needed for pain.  Marland Kitchen sertraline (ZOLOFT) 100 MG tablet Take 200 mg by mouth daily.   . simvastatin (ZOCOR) 20 MG tablet TAKE 1 TABLET DAILY  . Vitamin D, Ergocalciferol, (DRISDOL) 50000 units CAPS capsule TAKE 1 CAPSULE EVERY 7 DAYS  . aspirin 81 MG tablet Take 81 mg by mouth daily.    . Probiotic Product (PROBIOTIC DAILY PO) Take 1 tablet by mouth every other day.   No facility-administered encounter medications on file as of 09/19/2018.     Activities of Daily Living In your present state of health, do you have any difficulty performing the following activities: 09/19/2018  Hearing? Y  Comment minor hearing loss  Vision? N  Difficulty concentrating or making decisions? Y  Walking or climbing stairs? N  Dressing or bathing? N  Doing errands, shopping? N  Preparing Food and eating ? N  Using the Toilet? N  In the past six months, have you accidently leaked urine? Y  Do you have problems with loss of bowel control? N  Managing your Medications? N  Managing your Finances? N  Housekeeping or managing your Housekeeping? N  Some recent data might be hidden    Patient Care Team: [name], MD as PCP - General (Family Medicine) Cottle, Billey Co., MD as Attending Physician (Psychiatry)   Assessment:   This is a routine wellness examination for Donyel.  Exercise Activities and Dietary  recommendations Current Exercise Habits: Home exercise routine, Type of exercise: walking, Time (Minutes): 45, Frequency (Times/Week): 3(will start walking at the park), Weekly Exercise (Minutes/Week): 135  Goals    . exercise     Will start walking regularly with wife if possible     . Exercise 150 min/wk Moderate Activity     Regular aerobic exercise;  [employer]  Start out  walking tomorrow in the park; Will plan time around daily events to schedule exercise         Fall Risk Fall Risk  09/19/2018 09/12/2017 01/19/2015 07/09/2013  Falls in the past year? Yes Yes No No  Comment - came in the front door and tripped; hit sheet rock wiht shoulder  - -  Number falls in past yr: 2 or more - - -  Comment fell in church; one time knee gave away; coming down steps  - - -  Injury with Fall? No - - -  Follow up Education provided - - -    Depression Screen PHQ 2/9 Scores 09/19/2018 09/12/2017 01/19/2015 07/09/2013  PHQ - 2 Score 0 0 0 0    Cognitive Function MMSE - Mini Mental State Exam 09/19/2018 09/12/2017  Orientation to time 3 5  Orientation to Place 5 5  Registration 3 3  Attention/ Calculation 5 5  Recall 2 3  Language- name 2 objects 2 2  Language- repeat 1 1  Language- follow 3 step command 3 3  Language- read & follow direction 1 1  Write a sentence 1 1  Copy design 1 1  Total score 59 30    Sister had early onset of alz 60 and died at 65 Discussed memory issues  Failed clock test and hands were misplaced for 3:35  Stated he knew the hands were wrong but could not self correct.  Missed year 2001 and date  Recall 2/3   To be make an apt with Dr. [name] to fup on urinary freq getting up 4 to 5 times at hs  May also fup regarding MMSE.       Immunization History  Administered Date(s) Administered  . H1N1 12/24/2008  . Influenza Split 11/14/2011, 10/05/2012  . Influenza Whole 10/15/2008, 11/20/2009, 12/10/2010  . Influenza, High Dose Seasonal PF  11/27/2015, 11/29/2016, 09/05/2017, 09/03/2018  . Influenza,inj,Quad PF,6+ Mos 09/26/2013, 10/23/2014  . Pneumococcal Conjugate-13 07/17/2014  . Pneumococcal Polysaccharide-23 07/20/2015  . Tdap 01/19/2015     Screening Tests Health Maintenance  Topic Date Due  . COLONOSCOPY  09/15/2020  . TETANUS/TDAP  01/19/2025  . INFLUENZA VACCINE  Completed  . PNA vac Low Risk Adult  Completed         Plan:      PCP Notes   Health Maintenance Flu vaccines not available today; will check with pharmacy  PSA 08/2018  Colonoscopy 08/2017 - due 08/2020  Abnormal Screens  MMSE 27/30;  2/3 recall Missed year and date Failed clock test   Referrals  none  Patient concerns; Gets up several times to go to the bathroom 2 to 3 times and maybe 5  Will make fup apt with Dr. [name]  Nurse Concerns; As noted.   Next PCP apt  03/05/19 (thought they were going to make an apt sooner)       I have personally reviewed and noted the following in the patient's chart:   . Medical and social history . Use of alcohol, tobacco or illicit drugs  . Current medications and supplements . Functional ability and status . Nutritional status . Physical activity . Advanced directives . List of other physicians . Hospitalizations, surgeries, and ER visits in previous 12 months . Vitals . Screenings to include cognitive, depression, and falls . Referrals and appointments  In addition, I have reviewed and discussed with patient certain preventive protocols, quality metrics, and best practice recommendations. A written personalized care plan for preventive services  as well as general preventive health recommendations were provided to patient.     PPGFQ,MKJIZ, RN  09/19/2018  I have reviewed the documentation for the AWV and [place] provided by the health coach and agree with their documentation. I was immediately available for any questions  [name] MD [place]  [facility] at [facility]

## 2018-09-19 ENCOUNTER — Ambulatory Visit (INDEPENDENT_AMBULATORY_CARE_PROVIDER_SITE_OTHER): Payer: Medicare Other

## 2018-09-19 VITALS — BP 108/70 | HR 71 | Ht 75.0 in | Wt 219.0 lb

## 2018-09-19 DIAGNOSIS — Z0000 Encounter for general adult medical examination without abnormal findings: Secondary | ICD-10-CM

## 2018-09-19 NOTE — Patient Instructions (Addendum)
Jason [name] , Thank you for taking time to come for your Medicare Wellness Visit. I appreciate your ongoing commitment to your health goals. Please review the following plan we discussed and let me know if I can assist you in the future.   Make an apt with Jason [name] for next year  Make an apt with Dr. Elease [name] to discuss frequent urination at hs and memory test   Shingrix is a vaccine for the prevention of Shingles in Adults 50 and older.  If you are on Medicare, the shingrix is covered under your Part D plan, so you will take both of the vaccines in the series at your pharmacy. Please check with your benefits regarding applicable copays or out of pocket expenses.  The Shingrix is given in 2 vaccines approx 8 weeks apart. You must receive the 2nd dose prior to 6 months from receipt of the first. Please have the pharmacist print out you Immunization  dates for our office records   You can have a hearing screen anywhere and it is free Deaf & Hard of Hearing Division Services - can assist with hearing aid x 1  No reviews  [facility]  [id number]  [phone] Jason [name]  [email or web address]    These are the goals we discussed: Goals    . exercise     Will start walking regularly with wife if possible     . Exercise 150 min/wk Moderate Activity     Regular aerobic exercise;  [employer]  Start out walking tomorrow in the park; Will plan time around daily events to schedule exercise         This is a list of the screening recommended for you and due dates:  Health Maintenance  Topic Date Due  . Colon Cancer Screening  09/15/2020  . Tetanus Vaccine  01/19/2025  . Flu Shot  Completed  . Pneumonia vaccines  Completed      Fall Prevention in the Home Falls can cause injuries. They can happen to people of all ages. There are many things you can do to make your home  safe and to help prevent falls. What can I do on the outside of my home?  Regularly fix the edges of walkways and driveways and fix any cracks.  Remove anything that might make you trip as you walk through a door, such as a raised step or threshold.  Trim any bushes or trees on the path to your home.  Use bright outdoor lighting.  Clear any walking paths of anything that might make someone trip, such as rocks or tools.  Regularly check to see if handrails are loose or broken. Make sure that both sides of any steps have handrails.  Any raised decks and porches should have guardrails on the edges.  Have any leaves, snow, or ice cleared regularly.  Use sand or salt on walking paths during winter.  Clean up any spills in your garage right away. This includes oil or grease spills. What can I do in the bathroom?  Use night lights.  Install grab bars by the toilet and in the tub and shower. Do not use towel bars as grab bars.  Use non-skid mats or decals in the tub or shower.  If you need to sit down in the shower, use a plastic, non-slip stool.  Keep the floor dry. Clean up any water that spills on the floor as soon as it happens.  Remove soap buildup  in the tub or shower regularly.  Attach bath mats securely with double-sided non-slip rug tape.  Do not have throw rugs and other things on the floor that can make you trip. What can I do in the bedroom?  Use night lights.  Make sure that you have a light by your bed that is easy to reach.  Do not use any sheets or blankets that are too big for your bed. They should not hang down onto the floor.  Have a firm chair that has side arms. You can use this for support while you get dressed.  Do not have throw rugs and other things on the floor that can make you trip. What can I do in the kitchen?  Clean up any spills right away.  Avoid walking on wet floors.  Keep items that you use a lot in easy-to-reach places.  If you  need to reach something above you, use a strong step stool that has a grab bar.  Keep electrical cords out of the way.  Do not use floor polish or wax that makes floors slippery. If you must use wax, use non-skid floor wax.  Do not have throw rugs and other things on the floor that can make you trip. What can I do with my stairs?  Do not leave any items on the stairs.  Make sure that there are handrails on both sides of the stairs and use them. Fix handrails that are broken or loose. Make sure that handrails are as long as the stairways.  Check any carpeting to make sure that it is firmly attached to the stairs. Fix any carpet that is loose or worn.  Avoid having throw rugs at the top or bottom of the stairs. If you do have throw rugs, attach them to the floor with carpet tape.  Make sure that you have a light switch at the top of the stairs and the bottom of the stairs. If you do not have them, ask someone to add them for you. What else can I do to help prevent falls?  Wear shoes that: ? Do not have high heels. ? Have rubber bottoms. ? Are comfortable and fit you well. ? Are closed at the toe. Do not wear sandals.  If you use a stepladder: ? Make sure that it is fully opened. Do not climb a closed stepladder. ? Make sure that both sides of the stepladder are locked into place. ? Ask someone to hold it for you, if possible.  Clearly mark and make sure that you can see: ? Any grab bars or handrails. ? First and last steps. ? Where the edge of each step is.  Use tools that help you move around (mobility aids) if they are needed. These include: ? Canes. ? Walkers. ? Scooters. ? Crutches.  Turn on the lights when you go into a dark area. Replace any light bulbs as soon as they burn out.  Set up your furniture so you have a clear path. Avoid moving your furniture around.  If any of your floors are uneven, fix them.  If there are any pets around you, be aware of where they  are.  Review your medicines with your doctor. Some medicines can make you feel dizzy. This can increase your chance of falling. Ask your doctor what other things that you can do to help prevent falls. This information is not intended to replace advice given to you by your health care provider. Make  sure you discuss any questions you have with your health care provider. Document Released: 10/08/2009 Document Revised: 05/19/2016 Document Reviewed: 01/16/2015 Elsevier Interactive Patient Education  2018 [place] Maintenance, Male A healthy lifestyle and preventive care is important for your health and wellness. Ask your health care provider about what schedule of regular examinations is right for you. What should I know about weight and diet? Eat a Healthy Diet  Eat plenty of vegetables, fruits, whole grains, low-fat dairy products, and lean protein.  Do not eat a lot of foods high in solid fats, added sugars, or salt.  Maintain a Healthy Weight Regular exercise can help you achieve or maintain a healthy weight. You should:  Do at least 150 minutes of exercise each week. The exercise should increase your heart rate and make you sweat (moderate-intensity exercise).  Do strength-training exercises at least twice a week.  Watch Your Levels of Cholesterol and Blood Lipids  Have your blood tested for lipids and cholesterol every 5 years starting at 75 years of age. If you are at high risk for heart disease, you should start having your blood tested when you are 75 years old. You may need to have your cholesterol levels checked more often if: ? Your lipid or cholesterol levels are high. ? You are older than 75 years of age. ? You are at high risk for heart disease.  What should I know about cancer screening? Many types of cancers can be detected early and may often be prevented. Lung Cancer  You should be screened every year for lung cancer if: ? You are a current smoker  who has smoked for at least 30 years. ? You are a former smoker who has quit within the past 15 years.  Talk to your health care provider about your screening options, when you should start screening, and how often you should be screened.  Colorectal Cancer  Routine colorectal cancer screening usually begins at 75 years of age and should be repeated every 5-10 years until you are 75 years old. You may need to be screened more often if early forms of precancerous polyps or small growths are found. Your health care provider may recommend screening at an earlier age if you have risk factors for colon cancer.  Your health care provider may recommend using home test kits to check for hidden blood in the stool.  A small camera at the end of a tube can be used to examine your colon (sigmoidoscopy or colonoscopy). This checks for the earliest forms of colorectal cancer.  Prostate and Testicular Cancer  Depending on your age and overall health, your health care provider may do certain tests to screen for prostate and testicular cancer.  Talk to your health care provider about any symptoms or concerns you have about testicular or prostate cancer.  Skin Cancer  Check your skin from head to toe regularly.  Tell your health care provider about any new moles or changes in moles, especially if: ? There is a change in a mole's size, shape, or color. ? You have a mole that is larger than a pencil eraser.  Always use sunscreen. Apply sunscreen liberally and repeat throughout the day.  Protect yourself by wearing long sleeves, pants, a wide-brimmed hat, and sunglasses when outside.  What should I know about heart disease, diabetes, and high blood pressure?  If you are 36-48 years of age, have your blood pressure checked every 3-5 years. If you are 40  years of age or older, have your blood pressure checked every year. You should have your blood pressure measured twice-once when you are at a hospital or  clinic, and once when you are not at a hospital or clinic. Record the average of the two measurements. To check your blood pressure when you are not at a hospital or clinic, you can use: ? An automated blood pressure machine at a pharmacy. ? A home blood pressure monitor.  Talk to your health care provider about your target blood pressure.  If you are between 47-57 years old, ask your health care provider if you should take aspirin to prevent heart disease.  Have regular diabetes screenings by checking your fasting blood sugar level. ? If you are at a normal weight and have a low risk for diabetes, have this test once every three years after the age of 57. ? If you are overweight and have a high risk for diabetes, consider being tested at a younger age or more often.  A one-time screening for abdominal aortic aneurysm (AAA) by ultrasound is recommended for men aged 26-75 years who are current or former smokers. What should I know about preventing infection? Hepatitis B If you have a higher risk for hepatitis B, you should be screened for this virus. Talk with your health care provider to find out if you are at risk for hepatitis B infection. Hepatitis C Blood testing is recommended for:  Everyone born from [birth year] through 1963.  Anyone with known risk factors for hepatitis C.  Sexually Transmitted Diseases (STDs)  You should be screened each year for STDs including gonorrhea and chlamydia if: ? You are sexually active and are younger than 75 years of age. ? You are older than 75 years of age and your health care provider tells you that you are at risk for this type of infection. ? Your sexual activity has changed since you were last screened and you are at an increased risk for chlamydia or gonorrhea. Ask your health care provider if you are at risk.  Talk with your health care provider about whether you are at high risk of being infected with HIV. Your health care provider may recommend a  prescription medicine to help prevent HIV infection.  What else can I do?  Schedule regular health, dental, and eye exams.  Stay current with your vaccines (immunizations).  Do not use any tobacco products, such as cigarettes, chewing tobacco, and e-cigarettes. If you need help quitting, ask your health care provider.  Limit alcohol intake to no more than 2 drinks per day. One drink equals 12 ounces of beer, 5 ounces of wine, or 1 ounces of hard liquor.  Do not use street drugs.  Do not share needles.  Ask your health care provider for help if you need support or information about quitting drugs.  Tell your health care provider if you often feel depressed.  Tell your health care provider if you have ever been abused or do not feel safe at home. This information is not intended to replace advice given to you by your health care provider. Make sure you discuss any questions you have with your health care provider. Document Released: 06/09/2008 Document Revised: 08/10/2016 Document Reviewed: 09/15/2015 Elsevier Interactive Patient Education  2018 [employer].   Hearing Loss Hearing loss is a partial or total loss of the ability to hear. This can be temporary or permanent, and it can happen in one or both ears. Hearing loss  may be referred to as deafness. Medical care is necessary to treat hearing loss properly and to prevent the condition from getting worse. Your hearing may partially or completely come back, depending on what caused your hearing loss and how severe it is. In some cases, hearing loss is permanent. What are the causes? Common causes of hearing loss include:  Too much wax in the ear canal.  Infection of the ear canal or middle ear.  Fluid in the middle ear.  Injury to the ear or surrounding area.  An object stuck in the ear.  Prolonged exposure to loud sounds, such as music.  Less common causes of hearing loss include:  Tumors in the ear.  Viral or  bacterial infections, such as meningitis.  A hole in the eardrum (perforated eardrum).  Problems with the hearing nerve that sends signals between the brain and the ear.  Certain medicines.  What are the signs or symptoms? Symptoms of this condition may include:  Difficulty telling the difference between sounds.  Difficulty following a conversation when there is background noise.  Lack of response to sounds in your environment. This may be most noticeable when you do not respond to startling sounds.  Needing to turn up the volume on the television, radio, etc.  Ringing in the ears.  Dizziness.  Pain in the ears.  How is this diagnosed? This condition is diagnosed based on a physical exam and a hearing test (audiometry). The audiometry test will be performed by a hearing specialist (audiologist). You may also be referred to an ear, nose, and throat (ENT) specialist (otolaryngologist). How is this treated? Treatment for recent onset of hearing loss may include:  Ear wax removal.  Being prescribed medicines to prevent infection (antibiotics).  Being prescribed medicines to reduce inflammation (corticosteroids).  Follow these instructions at home:  If you were prescribed an antibiotic medicine, take it as told by your health care provider. Do not stop taking the antibiotic even if you start to feel better.  Take over-the-counter and prescription medicines only as told by your health care provider.  Avoid loud noises.  Return to your normal activities as told by your health care provider. Ask your health care provider what activities are safe for you.  Keep all follow-up visits as told by your health care provider. This is important. Contact a health care provider if:  You feel dizzy.  You develop new symptoms.  You vomit or feel nauseous.  You have a fever. Get help right away if:  You develop sudden changes in your vision.  You have severe ear pain.  You  have new or increased weakness.  You have a severe headache. This information is not intended to replace advice given to you by your health care provider. Make sure you discuss any questions you have with your health care provider. Document Released: 12/12/2005 Document Revised: 05/19/2016 Document Reviewed: 04/29/2015 Elsevier Interactive Patient Education  2018 [employer].

## 2018-09-24 ENCOUNTER — Ambulatory Visit (INDEPENDENT_AMBULATORY_CARE_PROVIDER_SITE_OTHER): Payer: Medicare Other | Admitting: Family Medicine

## 2018-09-24 ENCOUNTER — Other Ambulatory Visit: Payer: Self-pay

## 2018-09-24 ENCOUNTER — Encounter: Payer: Self-pay | Admitting: Family Medicine

## 2018-09-24 VITALS — BP 106/64 | HR 71 | Temp 97.8°F | Wt 215.8 lb

## 2018-09-24 DIAGNOSIS — R3915 Urgency of urination: Secondary | ICD-10-CM

## 2018-09-24 MED ORDER — MIRABEGRON ER 25 MG PO TB24: 25.0000 mg | ORAL_TABLET | Freq: Every day | ORAL | 5 refills | Status: DC

## 2018-09-24 NOTE — Patient Instructions (Signed)
No caffeine after about 2 pm  Limit fluid intake at night.

## 2018-09-24 NOTE — Progress Notes (Signed)
  Subjective:     Patient ID: Jason [name], male   DOB: 06-06-1941, 77 y.o.   MRN: [id number]  HPI Patient was seen with chief complaint of urine frequency especially at night.  He states he frequently has to get up 4-6 times at night.  He denies any significant slow stream or any difficulty getting stream started.  No burning with urination.  Does tend to drink more fluids late in the day and occasional caffeine in the evening.  No alcohol use.  Recent PSA 0.8.  He is having increased fatigue which he attributes to sleep disruption at night.  Increased sense of urgency.  Past Medical History:  Diagnosis Date  . Anxiety   . Atrial fibrillation ([place])    past hx  . Colon polyp   . Depression   . GERD (gastroesophageal reflux disease)   . Gout, unspecified   . Hypertension   . Osteoarthrosis, unspecified whether generalized or localized, unspecified site   . Pneumonia, organism unspecified(486)   . Pure hypercholesterolemia   . Routine general medical examination at a health care facility   . Sleep apnea    not using cpap currently  . Unspecified nonpsychotic mental disorder    Past Surgical History:  Procedure Laterality Date  . COLONOSCOPY  10/04/2006   jacobs  . neg hx    . UPPER GASTROINTESTINAL ENDOSCOPY  2015  . WISDOM TOOTH EXTRACTION      reports that he has never smoked. He has never used smokeless tobacco. He reports that he does not drink alcohol or use drugs. family history includes Arthritis in his other; Dementia in his mother; Heart failure in his father; Pneumonia in his father. No Known Allergies   Review of Systems  Constitutional: Positive for fatigue. Negative for chills and fever.  Genitourinary: Positive for urgency. Negative for dysuria, flank pain and hematuria.       Objective:   Physical Exam  Constitutional: He appears well-developed and well-nourished.  Cardiovascular: Normal rate and regular rhythm.  Pulmonary/Chest: Effort normal and breath  sounds normal.  Musculoskeletal: He exhibits no edema.       Assessment:     Patient presents with several month history of progressive nocturia and urine urgency.  He is not describing any obstructive type symptoms nor any infectious symptoms such as burning with urination.  Recent PSA as above normal    Plan:     -Limit caffeine after about 2 PM -Limit overall fluids in the evening -Trial of Myrbetriq 25 mg nightly and give some feedback in a couple weeks if not improving  [name] MD [facility]

## 2018-10-24 ENCOUNTER — Other Ambulatory Visit: Payer: Self-pay | Admitting: Family Medicine

## 2018-11-05 ENCOUNTER — Other Ambulatory Visit: Payer: Self-pay | Admitting: Family Medicine

## 2018-12-14 ENCOUNTER — Other Ambulatory Visit: Payer: Self-pay | Admitting: Family Medicine

## 2018-12-23 DIAGNOSIS — F411 Generalized anxiety disorder: Secondary | ICD-10-CM

## 2019-01-08 ENCOUNTER — Other Ambulatory Visit: Payer: Self-pay | Admitting: Family Medicine

## 2019-01-10 ENCOUNTER — Other Ambulatory Visit: Payer: Self-pay | Admitting: Psychiatry

## 2019-01-11 LAB — LITHIUM LEVEL: Lithium Lvl: 0.5 mmol/L — ABNORMAL LOW (ref 0.6–1.2)

## 2019-01-16 ENCOUNTER — Ambulatory Visit: Payer: Self-pay | Admitting: Psychiatry

## 2019-02-07 ENCOUNTER — Ambulatory Visit: Payer: Self-pay | Admitting: Psychiatry

## 2019-02-21 ENCOUNTER — Ambulatory Visit (INDEPENDENT_AMBULATORY_CARE_PROVIDER_SITE_OTHER): Payer: Medicare Other | Admitting: Psychiatry

## 2019-02-21 ENCOUNTER — Encounter: Payer: Self-pay | Admitting: Psychiatry

## 2019-02-21 DIAGNOSIS — F422 Mixed obsessional thoughts and acts: Secondary | ICD-10-CM

## 2019-02-21 DIAGNOSIS — F411 Generalized anxiety disorder: Secondary | ICD-10-CM

## 2019-02-21 DIAGNOSIS — F3342 Major depressive disorder, recurrent, in full remission: Secondary | ICD-10-CM | POA: Diagnosis not present

## 2019-02-21 MED ORDER — SERTRALINE HCL 100 MG PO TABS: 200.0000 mg | ORAL_TABLET | Freq: Every day | ORAL | 1 refills | Status: DC

## 2019-02-21 MED ORDER — LITHIUM CARBONATE 300 MG PO CAPS: 600.0000 mg | ORAL_CAPSULE | Freq: Every day | ORAL | 1 refills | Status: DC

## 2019-02-21 NOTE — Progress Notes (Addendum)
[name] [id number] [birth year] 78 y.o.  Subjective:   Patient ID:  Jason [name] is a 78 y.o. (DOB 1941/01/31) male.  Chief Complaint:  Chief Complaint  Patient presents with  . Follow-up    Medication management   Last seen July 2019 HPI  Seen with wife. Jason [name] presents to the office today for follow-up of MDE, OCD and GAD.  At the last visit no changes made except Metafolbic plus added. No problems off the olanzapine for a year.  Fine.  Depression and anxiety under control.  Except notices Feb each year a change in disposition but it's minor.  A little anxious without reason.  Patient reports stable mood and denies depressed or irritable moods.   Patient denies difficulty with sleep initiation or maintenance. Denies appetite disturbance.  Patient reports that energy and motivation have been good.  Patient denies any difficulty with concentration.  Patient denies any suicidal ideation.  Stress caring for inlaws in their 90s.  F good cognition.  M cognitive problems.  Past Psychiatric Medication Trials: Wellbutrin side effects, Lexapro nausea, Abilify, paroxetine, fluoxetine briefly, do duloxetine, ECT, Effexor with a history of benefit, mirtazapine, Pristiq 100, buspirone side effects, sertraline 200, lithium 600, olanzapine 2.5 with benefit.   Review of Systems:  Review of Systems  Neurological: Negative for tremors and weakness.  Psychiatric/Behavioral: Negative for agitation, behavioral problems, confusion, decreased concentration, dysphoric mood, hallucinations, self-injury, sleep disturbance and suicidal ideas. The patient is nervous/anxious. The patient is not hyperactive.     Medications: I have reviewed the patient's current medications.  Current Outpatient Medications  Medication Sig Dispense Refill  . amLODipine (NORVASC) 5 MG tablet TAKE 1 TABLET (5 MG TOTAL) BY MOUTH DAILY. 90 tablet 2  . lithium carbonate 300 MG capsule Take 2 capsules (600 mg total) by  mouth daily. Takes 2 tablets once per day with dinner. 180 capsule 1  . meloxicam (MOBIC) 15 MG tablet Take 15 mg by mouth as needed for pain.    . Methylfol-Methylcob-Acetylcyst (CEREFOLIN NAC PO) Take by mouth.    . sertraline (ZOLOFT) 100 MG tablet Take 2 tablets (200 mg total) by mouth daily. 180 tablet 1  . simvastatin (ZOCOR) 20 MG tablet TAKE 1 TABLET DAILY 90 tablet 1  . simvastatin (ZOCOR) 20 MG tablet TAKE 1 TABLET DAILY 90 tablet 1  . Vitamin D, Ergocalciferol, (DRISDOL) 1.25 MG (50000 UT) CAPS capsule TAKE 1 CAPSULE EVERY 7 DAYS 12 capsule 1   No current facility-administered medications for this visit.     Medication Side Effects: None  Allergies: No Known Allergies  Past Medical History:  Diagnosis Date  . Anxiety   . Atrial fibrillation ([place])    past hx  . Colon polyp   . Depression   . GERD (gastroesophageal reflux disease)   . Gout, unspecified   . Hypertension   . Osteoarthrosis, unspecified whether generalized or localized, unspecified site   . Pneumonia, organism unspecified(486)   . Pure hypercholesterolemia   . Routine general medical examination at a health care facility   . Sleep apnea    not using cpap currently  . Unspecified nonpsychotic mental disorder     Family History  Problem Relation Age of Onset  . Dementia Mother   . Pneumonia Father   . Heart failure Father   . Arthritis Other   . Colon cancer Neg Hx   . Colon polyps Neg Hx   . Esophageal cancer Neg Hx   . Stomach  cancer Neg Hx   . Rectal cancer Neg Hx     Social History   Socioeconomic History  . Marital status: Married    Spouse name: [name]  . Number of children: 0  . Years of education: Not on file  . Highest education level: Not on file  Occupational History  . Occupation: retired    Fish farm manager: [employer]  Social Needs  . Financial resource strain: Not on file  . Food insecurity:    Worry: Not on file    Inability: Not on file  . Transportation needs:    Medical: Not  on file    Non-medical: Not on file  Tobacco Use  . Smoking status: Never Smoker  . Smokeless tobacco: Never Used  Substance and Sexual Activity  . Alcohol use: No  . Drug use: No  . Sexual activity: Not on file  Lifestyle  . Physical activity:    Days per week: Not on file    Minutes per session: Not on file  . Stress: Not on file  Relationships  . Social connections:    Talks on phone: Not on file    Gets together: Not on file    Attends religious service: Not on file    Active member of club or organization: Not on file    Attends meetings of clubs or organizations: Not on file    Relationship status: Not on file  . Intimate partner violence:    Fear of current or ex partner: Not on file    Emotionally abused: Not on file    Physically abused: Not on file    Forced sexual activity: Not on file  Other Topics Concern  . Not on file  Social History Narrative  . Not on file    Past Medical History, Surgical history, Social history, and Family history were reviewed and updated as appropriate.   Please see review of systems for further details on the patient's review from today.   Objective:   Physical Exam:  There were no vitals taken for this visit.  Physical Exam Constitutional:      General: He is not in acute distress.    Appearance: He is well-developed.  Musculoskeletal:        General: No deformity.  Neurological:     Mental Status: He is alert and oriented to person, place, and time.     Coordination: Coordination normal.  Psychiatric:        Attention and Perception: Attention normal. He is attentive. He does not perceive auditory hallucinations.        Mood and Affect: Mood normal. Mood is not anxious or depressed. Affect is not labile, blunt, angry or inappropriate.        Speech: Speech normal.        Behavior: Behavior normal.        Thought Content: Thought content normal. Thought content does not include homicidal or suicidal ideation.         Cognition and Memory: Cognition normal.        Judgment: Judgment normal.     Comments: Insight is fair to good.     Lab Review:     Component Value Date/Time   NA 138 09/03/2018 1410   K 4.5 09/03/2018 1410   CL 105 09/03/2018 1410   CO2 26 09/03/2018 1410   GLUCOSE 87 09/03/2018 1410   BUN 16 09/03/2018 1410   CREATININE 1.09 09/03/2018 1410   CALCIUM 10.0 09/03/2018 1410  PROT 6.8 03/06/2018 1343   ALBUMIN 4.4 03/06/2018 1343   AST 21 03/06/2018 1343   ALT 20 03/06/2018 1343   ALKPHOS 67 03/06/2018 1343   BILITOT 0.6 03/06/2018 1343   GFRNONAA 102.71 11/20/2009 0956   GFRAA 125 07/16/2008 0000       Component Value Date/Time   WBC 7.4 01/13/2014 1134   RBC 4.76 01/13/2014 1134   HGB 14.5 01/13/2014 1134   HCT 43.2 01/13/2014 1134   PLT 237.0 01/13/2014 1134   MCV 90.7 01/13/2014 1134   MCHC 33.5 01/13/2014 1134   RDW 13.3 01/13/2014 1134   LYMPHSABS 1.8 01/13/2014 1134   MONOABS 0.4 01/13/2014 1134   EOSABS 0.3 01/13/2014 1134   BASOSABS 0.0 01/13/2014 1134   Normal TSH in September.  Last lithium 0.8 in July 2019  Lithium level January 10, 2027 0.5  No results found for: POCLITH, LITHIUM   No results found for: PHENYTOIN, PHENOBARB, VALPROATE, CBMZ   .res Assessment: Plan:    Recurrent major depression in complete remission ([place])  Mixed obsessional thoughts and acts  Generalized anxiety disorder   Mr. Jutras has a long history of the above diagnoses which have been treatment resistant at times and multiple relapses.  He ultimately responded to the combination of sertraline and lithium and Zyprexa.  He has been able to wean off the Zyprexa without complication.  He's continuing to do well.  As noted multiple failed meds and a history of treatment resistance. Each diagnosis under control and meds helping.  Supportive therapy dealing with sick in-laws and caretaking.  Encourage self-care and recreation.  Counseled patient regarding potential benefits,  risks, and side effects of lithium to include potential risk of lithium affecting thyroid and renal function.  Discussed need for periodic lab monitoring to determine drug level and to assess for potential adverse effects.  Counseled patient regarding signs and symptoms of lithium toxicity and advised that they notify office immediately or seek urgent medical attention if experiencing these signs and symptoms.  Patient advised to contact office with any questions or concerns.  Lithium level is in the low normal range and is typical for the levels he has had over the last couple of years.  It has been effective at this low dosage so we will make no med changes.  Disc the difference between Cerefolin NAC and Metafolbic plus.  No med changes made.  This appt was 30 mins.  FU 6 mos  Lynder Parents, MD, DFAPA     Please see After Visit Summary for patient specific instructions.  Future Appointments  Date Time Provider [place]  03/05/2019 11:00 AM [name], MD [facility]  09/24/2019 11:00 AM [facility]    No orders of the defined types were placed in this encounter.     -------------------------------

## 2019-03-05 ENCOUNTER — Other Ambulatory Visit: Payer: Self-pay

## 2019-03-05 ENCOUNTER — Encounter: Payer: Self-pay | Admitting: Family Medicine

## 2019-03-05 ENCOUNTER — Ambulatory Visit (INDEPENDENT_AMBULATORY_CARE_PROVIDER_SITE_OTHER): Payer: Medicare Other | Admitting: Family Medicine

## 2019-03-05 VITALS — BP 110/68 | HR 73 | Temp 98.2°F | Ht 72.75 in | Wt 212.3 lb

## 2019-03-05 DIAGNOSIS — E7800 Pure hypercholesterolemia, unspecified: Secondary | ICD-10-CM

## 2019-03-05 DIAGNOSIS — I10 Essential (primary) hypertension: Secondary | ICD-10-CM | POA: Diagnosis not present

## 2019-03-05 NOTE — Progress Notes (Signed)
  Subjective:     Patient ID: Jason [name], male   DOB: [birth year], 78 y.o.   MRN: [id number]  HPI Patient here for medical follow-up.  He has hypertension treated with amlodipine 5 mg daily.  He is on 20 mg of simvastatin for hyperlipidemia.  Has been on this combination for several years.  Blood pressure well controlled.  No significant dizziness or orthostatic symptoms.  He has history of recurrent depression and anxiety and is followed by psychiatry and maintained on lithium and sertraline.  Still has occasional anxiety symptoms in public but generally fairly well controlled.  Past Medical History:  Diagnosis Date  . Anxiety   . Atrial fibrillation ([place])    past hx  . Colon polyp   . Depression   . GERD (gastroesophageal reflux disease)   . Gout, unspecified   . Hypertension   . Osteoarthrosis, unspecified whether generalized or localized, unspecified site   . Pneumonia, organism unspecified(486)   . Pure hypercholesterolemia   . Routine general medical examination at a health care facility   . Sleep apnea    not using cpap currently  . Unspecified nonpsychotic mental disorder    Past Surgical History:  Procedure Laterality Date  . COLONOSCOPY  10/04/2006   jacobs  . neg hx    . UPPER GASTROINTESTINAL ENDOSCOPY  2015  . WISDOM TOOTH EXTRACTION      reports that he has never smoked. He has never used smokeless tobacco. He reports that he does not drink alcohol or use drugs. family history includes Arthritis in an other family member; Dementia in his mother; Heart failure in his father; Pneumonia in his father. No Known Allergies   Review of Systems  Constitutional: Negative for fatigue.  Eyes: Negative for visual disturbance.  Respiratory: Negative for cough, chest tightness and shortness of breath.   Cardiovascular: Negative for chest pain, palpitations and leg swelling.  Neurological: Negative for dizziness, syncope, weakness, light-headedness and headaches.        Objective:   Physical Exam Constitutional:      Appearance: He is well-developed.  HENT:     Right Ear: External ear normal.     Left Ear: External ear normal.  Eyes:     Pupils: Pupils are equal, round, and reactive to light.  Neck:     Musculoskeletal: Neck supple.     Thyroid: No thyromegaly.  Cardiovascular:     Rate and Rhythm: Normal rate and regular rhythm.  Pulmonary:     Effort: Pulmonary effort is normal. No respiratory distress.     Breath sounds: Normal breath sounds. No wheezing or rales.  Neurological:     Mental Status: He is alert and oriented to person, place, and time.        Assessment:     #1 hypertension stable and at goal  #2 history of chronic anxiety and depression stable-followed by psychiatry.  #3 dyslipidemia Plan:     -Continue current drug regimen.  He declines labs today but will plan to come back fasting in 6 months and obtain lipids and hepatic panel then  [name] MD [facility]

## 2019-04-05 ENCOUNTER — Other Ambulatory Visit: Payer: Self-pay | Admitting: Psychiatry

## 2019-04-17 ENCOUNTER — Other Ambulatory Visit: Payer: Self-pay | Admitting: Family Medicine

## 2019-04-24 ENCOUNTER — Ambulatory Visit: Payer: Self-pay | Admitting: *Deleted

## 2019-04-24 ENCOUNTER — Emergency Department (HOSPITAL_BASED_OUTPATIENT_CLINIC_OR_DEPARTMENT_OTHER): Payer: Medicare Other

## 2019-04-24 ENCOUNTER — Other Ambulatory Visit: Payer: Self-pay

## 2019-04-24 ENCOUNTER — Emergency Department (HOSPITAL_BASED_OUTPATIENT_CLINIC_OR_DEPARTMENT_OTHER)
Admission: EM | Admit: 2019-04-24 | Discharge: 2019-04-24 | Disposition: A | Discharge status: Home / Self Care | Payer: Medicare Other | Attending: Emergency Medicine | Admitting: Emergency Medicine

## 2019-04-24 ENCOUNTER — Encounter (HOSPITAL_BASED_OUTPATIENT_CLINIC_OR_DEPARTMENT_OTHER): Payer: Self-pay

## 2019-04-24 DIAGNOSIS — Y999 Unspecified external cause status: Secondary | ICD-10-CM | POA: Diagnosis not present

## 2019-04-24 DIAGNOSIS — Z791 Long term (current) use of non-steroidal anti-inflammatories (NSAID): Secondary | ICD-10-CM | POA: Insufficient documentation

## 2019-04-24 DIAGNOSIS — Z79899 Other long term (current) drug therapy: Secondary | ICD-10-CM | POA: Diagnosis not present

## 2019-04-24 DIAGNOSIS — S0181XA Laceration without foreign body of other part of head, initial encounter: Secondary | ICD-10-CM | POA: Insufficient documentation

## 2019-04-24 DIAGNOSIS — S01421A Laceration with foreign body of right cheek and temporomandibular area, initial encounter: Secondary | ICD-10-CM | POA: Insufficient documentation

## 2019-04-24 DIAGNOSIS — Y92003 Bedroom of unspecified non-institutional (private) residence as the place of occurrence of the external cause: Secondary | ICD-10-CM | POA: Insufficient documentation

## 2019-04-24 DIAGNOSIS — S199XXA Unspecified injury of neck, initial encounter: Secondary | ICD-10-CM | POA: Diagnosis not present

## 2019-04-24 DIAGNOSIS — Y939 Activity, unspecified: Secondary | ICD-10-CM | POA: Diagnosis not present

## 2019-04-24 DIAGNOSIS — W06XXXA Fall from bed, initial encounter: Secondary | ICD-10-CM | POA: Insufficient documentation

## 2019-04-24 DIAGNOSIS — I10 Essential (primary) hypertension: Secondary | ICD-10-CM | POA: Insufficient documentation

## 2019-04-24 DIAGNOSIS — S0990XA Unspecified injury of head, initial encounter: Secondary | ICD-10-CM | POA: Diagnosis not present

## 2019-04-24 MED ORDER — LIDOCAINE HCL (PF) 1 % IJ SOLN
30.0000 mL | Freq: Once | INTRAMUSCULAR | Status: DC
  Filled 2019-04-24: qty 30

## 2019-04-24 NOTE — ED Provider Notes (Signed)
[facility] EMERGENCY DEPARTMENT Provider Note   CSN: [id number] Arrival date & time: 04/24/19  1619    History   Chief Complaint Chief Complaint  Patient presents with   Head Injury    HPI Jason [name] is a 78 y.o. male with medical history significant for hypertension,  A. fib presenting to emergency department today with chief complaint of lacerations to his face after a fall.  Patient does not take any anticoagulants.  This fall happened 2 hours prior to arrival.  Patient states he was lying in bed with his wife watching TV and must have dozed off.  The next thing he remembers is falling out of bed and hitting his face on the bedside table.   He has laceration to his right cheek and chin.  He also has redness to his right sclera that his wife reports she noticed immediately after the fall.  He reports pain to his face immediately after the fall however states now he is pain-free.  Did not take anything for pain prior to arrival.  He states the bleeding stopped when he applied pressure to the lacerations.  He denies any head pain, visual changes, neck pain, loose teeth,  Patient denies biting his tongue, urinary incontinence Patient thinks his last tetanus shot was in the last 5 years.  Looking through his medical records it appears he had Tdap on 01/19/2015.   Spoke with patient's wife who witnessed the fall.  She denies any seizure-like activity or syncope.  She states patient is a vivid dreamer or and often times will flail his arms and legs.   History provided by patient, spouse, with additional history obtained from chart review.     Past Medical History:  Diagnosis Date   Anxiety    Atrial fibrillation ([place])    past hx   Colon polyp    Depression    GERD (gastroesophageal reflux disease)    Gout, unspecified    Hypertension    Osteoarthrosis, unspecified whether generalized or localized, unspecified site    Pneumonia, organism unspecified(486)    Pure  hypercholesterolemia    Routine general medical examination at a health care facility    Sleep apnea    not using cpap currently   Unspecified nonpsychotic mental disorder     Patient Active Problem List   Diagnosis Date Noted   GAD (generalized anxiety disorder) 12/23/2018   At high risk for falls 09/03/2018   Depression 08/18/2015   OSA (obstructive sleep apnea) 01/19/2015   Vitamin D deficiency 01/07/2013   Need for prophylactic vaccination and inoculation against influenza 11/14/2011   Hypertension 11/14/2011   Osteoarthritis 11/20/2009   PSYCHIATRIC DISORDER 07/16/2008   History of pneumonia 07/16/2008   History of atrial fibrillation    HYPERCHOLESTEROLEMIA     Past Surgical History:  Procedure Laterality Date   COLONOSCOPY  10/04/2006   jacobs   neg hx     UPPER GASTROINTESTINAL ENDOSCOPY  2015   WISDOM TOOTH EXTRACTION          Home Medications    Prior to Admission medications   Medication Sig Start Date End Date Taking? Authorizing Provider  amLODipine (NORVASC) 5 MG tablet TAKE 1 TABLET (5 MG TOTAL) BY MOUTH DAILY. 04/18/19   Burchette, Alinda Sierras, MD  lithium carbonate 300 MG capsule Take 2 capsules (600 mg total) by mouth daily. Takes 2 tablets once per day with dinner. 02/21/19   Cottle, Billey Co., MD  meloxicam [facility]) 15  MG tablet Take 15 mg by mouth as needed for pain.    [provider]  Methylfol-Algae-B12-Acetylcyst (CEREFOLIN NAC) 6-90.314-2-600 MG TABS TAKE 1 TABLET BY MOUTH EVERY DAY 04/06/19   Cottle, Billey Co., MD  Methylfol-Methylcob-Acetylcyst (CEREFOLIN NAC PO) Take by mouth daily.     [provider]  sertraline (ZOLOFT) 100 MG tablet Take 2 tablets (200 mg total) by mouth daily. 02/21/19   Cottle, Billey Co., MD  simvastatin (ZOCOR) 20 MG tablet TAKE 1 TABLET DAILY 04/24/18   Burchette, Alinda Sierras, MD  Vitamin D, Ergocalciferol, (DRISDOL) 1.25 MG (50000 UT) CAPS capsule TAKE 1 CAPSULE EVERY 7 DAYS 01/08/19    Burchette, Alinda Sierras, MD    Family History Family History  Problem Relation Age of Onset   Dementia Mother    Pneumonia Father    Heart failure Father    Arthritis Other    Colon cancer Neg Hx    Colon polyps Neg Hx    Esophageal cancer Neg Hx    Stomach cancer Neg Hx    Rectal cancer Neg Hx     Social History Social History   Tobacco Use   Smoking status: Never Smoker   Smokeless tobacco: Never Used  Substance Use Topics   Alcohol use: No   Drug use: No     Allergies   Patient has no known allergies.   Review of Systems Review of Systems  Constitutional: Negative for chills and fever.  HENT: Negative for congestion, facial swelling, rhinorrhea, sinus pressure and sore throat.   Eyes: Positive for redness. Negative for photophobia, pain, discharge and visual disturbance.  Respiratory: Negative for cough, shortness of breath and wheezing.   Cardiovascular: Negative for chest pain and palpitations.  Gastrointestinal: Negative for abdominal pain, constipation, diarrhea, nausea and vomiting.  Genitourinary: Negative for dysuria.  Musculoskeletal: Negative for arthralgias, back pain, myalgias and neck pain.  Skin: Positive for wound. Negative for rash.  Neurological: Negative for dizziness, syncope, weakness, numbness and headaches.  Psychiatric/Behavioral: Negative for confusion.     Physical Exam Updated Vital Signs BP 133/83 (BP Location: Left Arm)    Pulse 65    Temp 98.7 F (37.1 C) (Oral)    Resp 20    Ht 6\' 1"  (1.854 m)    Wt 94.3 kg    SpO2 100%    BMI 27.44 kg/m   Physical Exam Constitutional:      General: He is not in acute distress.    Appearance: He is not toxic-appearing.  HENT:     Head: Normocephalic. No Battle's sign.     Jaw: No tenderness, swelling, pain on movement or malocclusion.      Comments: No tenderness to palpation of skull or nose. No deformities or crepitus noted.  1 cm laceration under right eye, bleeding is  controlled.  There is surrounding ecchymosis.  2.5 cm gaping laceration to chin, bleeding is controlled.  There is no surrounding erythema or ecchymosis.    Right Ear: No hemotympanum.     Left Ear: No hemotympanum.     Nose: Nose normal. No nasal deformity, signs of injury or nasal tenderness.     Right Nostril: No septal hematoma.     Left Nostril: No septal hematoma.     Mouth/Throat:     Mouth: Mucous membranes are moist.     Pharynx: Oropharynx is clear.  Eyes:     General:        Right eye: No discharge.  Left eye: No discharge.     Extraocular Movements: Extraocular movements intact.     Pupils: Pupils are equal, round, and reactive to light.     Comments: redness to right sclera  Neck:     Musculoskeletal: Normal range of motion. No muscular tenderness.     Comments: Full ROM intact without spinous process TTP. No bony stepoffs or deformities, no paraspinous muscle TTP or muscle spasms. No rigidity or meningeal signs. No bruising, erythema, or swelling.  Cardiovascular:     Rate and Rhythm: Normal rate and regular rhythm.     Pulses: Normal pulses.     Heart sounds: Normal heart sounds.  Pulmonary:     Effort: Pulmonary effort is normal.     Breath sounds: Normal breath sounds.  Abdominal:     General: There is no distension.     Palpations: Abdomen is soft.     Tenderness: There is no abdominal tenderness. There is no guarding or rebound.  Musculoskeletal: Normal range of motion.     Right lower leg: No edema.     Left lower leg: No edema.     Comments: No tenderness to thoracic or lumbar spine.  Full range of motion of thoracic and lumbar spine with flexion, hyperextension, lateral flexion.  No midline tenderness or step-offs.  No tenderness to palpation of spinous processes of thoracic spine or lumbar spine.  Skin:    General: Skin is warm and dry.  Neurological:     General: No focal deficit present.     Mental Status: He is alert and oriented to person,  place, and time.     Comments: Speech is clear and goal oriented, follows commands CN III-XII intact, no facial droop Normal strength in upper and lower extremities bilaterally including dorsiflexion and plantar flexion, strong and equal grip strength Sensation normal to light and sharp touch Moves extremities without ataxia, coordination intact Normal finger to nose and rapid alternating movements Normal gait and balance    Psychiatric:        Behavior: Behavior normal.      ED Treatments / Results  Labs (all labs ordered are listed, but only abnormal results are displayed) Labs Reviewed - No data to display  EKG None  Radiology Ct Head Wo Contrast  Result Date: 04/24/2019 CLINICAL DATA:  [place] from the bed striking the head. EXAM: CT HEAD WITHOUT CONTRAST CT CERVICAL SPINE WITHOUT CONTRAST TECHNIQUE: Multidetector CT imaging of the head and cervical spine was performed following the standard protocol without intravenous contrast. Multiplanar CT image reconstructions of the cervical spine were also generated. COMPARISON:  None. FINDINGS: CT HEAD FINDINGS Brain: No sign of acute infarction, mass lesion, hemorrhage, hydrocephalus or extra-axial collection. Minor chronic small-vessel ischemic changes of the basal ganglia and hemispheric white matter. Vascular: There is atherosclerotic calcification of the major vessels at the base of the brain. Skull: Negative Sinuses/Orbits: There is what appears to be an old medial wall orbital blowout fracture on the right. No sign that this is acute. There is no layering fluid in any of the sinuses. Insignificant retention cyst in the left maxillary sinus. Orbits appear negative otherwise. Other: None CT CERVICAL SPINE FINDINGS Alignment: Normal Skull base and vertebrae: No fracture. Soft tissues and spinal canal: Negative Disc levels: Ordinary degenerative spondylosis from C3-4 through C7-T1 with disc space narrowing, endplate osteophytes and disc  bulges. No apparent significant stenosis of the canal. Osteophytic encroachment upon the neural foramina at those levels. Ordinary facet osteoarthritis on  both sides. Upper chest: Negative Other: None IMPRESSION: Head CT: No acute or traumatic finding. Mild chronic small-vessel change of the basal ganglia and white matter. Probable old medial orbital wall fracture. Cervical spine CT: No acute or traumatic finding. Ordinary degenerative changes. Electronically Signed   By: Nelson Chimes M.D.   On: 04/24/2019 19:47   Ct Cervical Spine Wo Contrast  Result Date: 04/24/2019 CLINICAL DATA:  [place] from the bed striking the head. EXAM: CT HEAD WITHOUT CONTRAST CT CERVICAL SPINE WITHOUT CONTRAST TECHNIQUE: Multidetector CT imaging of the head and cervical spine was performed following the standard protocol without intravenous contrast. Multiplanar CT image reconstructions of the cervical spine were also generated. COMPARISON:  None. FINDINGS: CT HEAD FINDINGS Brain: No sign of acute infarction, mass lesion, hemorrhage, hydrocephalus or extra-axial collection. Minor chronic small-vessel ischemic changes of the basal ganglia and hemispheric white matter. Vascular: There is atherosclerotic calcification of the major vessels at the base of the brain. Skull: Negative Sinuses/Orbits: There is what appears to be an old medial wall orbital blowout fracture on the right. No sign that this is acute. There is no layering fluid in any of the sinuses. Insignificant retention cyst in the left maxillary sinus. Orbits appear negative otherwise. Other: None CT CERVICAL SPINE FINDINGS Alignment: Normal Skull base and vertebrae: No fracture. Soft tissues and spinal canal: Negative Disc levels: Ordinary degenerative spondylosis from C3-4 through C7-T1 with disc space narrowing, endplate osteophytes and disc bulges. No apparent significant stenosis of the canal. Osteophytic encroachment upon the neural foramina at those levels. Ordinary facet  osteoarthritis on both sides. Upper chest: Negative Other: None IMPRESSION: Head CT: No acute or traumatic finding. Mild chronic small-vessel change of the basal ganglia and white matter. Probable old medial orbital wall fracture. Cervical spine CT: No acute or traumatic finding. Ordinary degenerative changes. Electronically Signed   By: Nelson Chimes M.D.   On: 04/24/2019 19:47    Procedures .Marland KitchenLaceration Repair Date/Time: 04/24/2019 9:16 PM Performed by: Cherre Robins, PA-C Authorized by: Cherre Robins, PA-C   Consent:    Consent obtained:  Verbal   Consent given by:  Patient   Risks discussed:  Infection and pain   Alternatives discussed:  No treatment and delayed treatment Universal protocol:    Immediately prior to procedure, a time out was called: yes     Patient identity confirmed:  Verbally with patient Anesthesia (see MAR for exact dosages):    Anesthesia method:  Local infiltration Laceration details:    Location:  Face   Face location:  Chin   Length (cm):  2.5 Repair type:    Repair type:  Simple Pre-procedure details:    Preparation:  Patient was prepped and draped in usual sterile fashion Exploration:    Hemostasis achieved with:  Direct pressure   Wound exploration: wound explored through full range of motion and entire depth of wound probed and visualized     Contaminated: no   Treatment:    Area cleansed with:  Saline   Amount of cleaning:  Standard   Irrigation solution:  Sterile saline   Irrigation volume:  500   Irrigation method:  Pressure wash   Visualized foreign bodies/material removed: no   Skin repair:    Repair method:  Sutures   Suture size:  5-0   Suture material:  Prolene   Suture technique:  Simple interrupted   Number of sutures:  6 Approximation:    Approximation:  Close Post-procedure details:  Dressing:  Open (no dressing) .Marland KitchenLaceration Repair Date/Time: 04/24/2019 9:18 PM Performed by: Cherre Robins, PA-C Authorized  by: Cherre Robins, PA-C   Consent:    Consent obtained:  Verbal   Consent given by:  Patient   Risks discussed:  Infection and pain   Alternatives discussed:  No treatment Anesthesia (see MAR for exact dosages):    Anesthesia method:  Local infiltration   Local anesthetic:  Lidocaine 1% w/o epi Laceration details:    Location:  Face   Face location:  R cheek   Length (cm):  1 Repair type:    Repair type:  Simple Pre-procedure details:    Preparation:  Patient was prepped and draped in usual sterile fashion and imaging obtained to evaluate for foreign bodies Exploration:    Hemostasis achieved with:  Direct pressure   Wound exploration: wound explored through full range of motion and entire depth of wound probed and visualized     Contaminated: no   Treatment:    Area cleansed with:  Saline   Amount of cleaning:  Standard   Irrigation solution:  Sterile saline   Irrigation volume:  250 ml   Irrigation method:  Syringe Skin repair:    Repair method:  Sutures   Suture size:  5-0   Suture material:  Prolene   Number of sutures:  1 Approximation:    Approximation:  Close Post-procedure details:    Dressing:  Open (no dressing)   (including critical care time)  Medications Ordered in ED Medications  lidocaine (PF) (XYLOCAINE) 1 % injection 30 mL (has no administration in time range)     Initial Impression / Assessment and Plan / ED Course  I have reviewed the triage vital signs and the nursing notes.  Pertinent labs & imaging results that were available during my care of the patient were reviewed by me and considered in my medical decision making (see chart for details).  78 year old male presenting with head injury after falling out of bed.  He has lacerations to his right cheek and chin that occurred 2 hours PTA . He is nontoxic appearing, resting comfortably.  He does not take any anticoagulants. CT head and neck viewed by me show no signs of bleeding, no acute  injury. On exam bleeding is controlled with direct pressure from both lacerations. He has redness to right sclera consistent with conjunctival hemorrhage. His vision is normal, there are no signs of hyphema. He has no pain to the eye, doubt corneal abrasion or laceration. Pupils are equal and reactive to light, EOMs are intact and without pain on movement.   Pressure irrigation performed. Wounds explored and base of wound visualized in a bloodless field without evidence of foreign body. Laceration repair per procedure note above, tolerated well. Tetanus is up to date. Do not feel that abx are indicated at this time based on wound appearance and lack of significant comorbidities. Discussed suture home care as well as need for wound recheck and suture removal in 5 days.  I discussed results, treatment plan, need for follow-up, and return precautions with the patient and his wife including signs of infection. Provided opportunity for questions, patient and wife confirmed understanding and are in agreement with plan. The patient was discussed with and seen by Dr. Billy Fischer who agrees with the treatment plan.   This note was prepared with assistance of Systems analyst. Occasional wrong-word or sound-a-like substitutions may have occurred due to the inherent limitations of voice recognition  software.   Final Clinical Impressions(s) / ED Diagnoses   Final diagnoses:  Facial laceration, initial encounter    ED Discharge Orders    None       Cherre Robins, PA-C 04/25/19 1113    Gareth Morgan, MD 04/25/19 1626

## 2019-04-24 NOTE — Telephone Encounter (Signed)
Please see message. °

## 2019-04-24 NOTE — Discharge Instructions (Addendum)

## 2019-04-24 NOTE — ED Notes (Signed)
Pt understood dc material. NAD noted. All questions answered to satisfaction. Ice pack given at dc. Pt escorted to checkout window.

## 2019-04-24 NOTE — Telephone Encounter (Signed)
Wife stated her husband fell out of the bed because of a dream he was having he hit the right side of his face and chin on the night stand.  He has a gash in the middle of his chin and has a cut under his right eye. This right eye is blood shot but he can see out of it. He has some blood coming from inside of his mouth and thinks he also cut his cheek on the inside of his mouth. He is not on any blood thinners. He did not lose consciousness. Pt is alert and oriented. Advised to put ice to his face and go to an urgent care for possible stiches. Last tetanus booster was 2016. Wife voiced understanding. Notified flow at [facility] at [facility]. Routing triage to office for review.  Answer Assessment - Initial Assessment Questions 1. MECHANISM: "How did the injury happen?"      Pt was dreaming and fell out of the bed 2. ONSET: "When did the injury happen?" (Minutes or hours ago)     Just now 3. LOCATION: "What part of the face is injured?"     The chin and right side of his fave 4. APPEARANCE of INJURY: "What does the face look like?"     Cut on the chin and under his right eye 5. BLEEDING: "Is it bleeding now?" If so, ask, "Is it difficult to stop?"     Yes a little 6. PAIN: "Is there pain?" If so, ask: "How bad is the pain?"  (e.g., Scale 1-10; or mild, moderate, severe)     Some pain, mild to moderate 7. SIZE: For cuts, bruises, or swelling, ask: "How large is it?" (e.g., inches or centimeters)      The cut on his chin is about a 3/4 inch gash and looks deep. And a cut under the right eye. 8. TETANUS: For any breaks in the skin, ask: "When was the last tetanus booster?"    01/19/2015 per chart 9. OTHER SYMPTOMS: "Do you have any other symptoms?" (e.g., neck pain, headache, loss of consciousness)     none 10. PREGNANCY: "Is there any chance you are pregnant?" "When was your last menstrual period?"       n/a  Protocols used: FACE INJURY-A-AH

## 2019-04-24 NOTE — Telephone Encounter (Signed)
noted 

## 2019-04-24 NOTE — ED Notes (Signed)
ED Provider at bedside. 

## 2019-04-24 NOTE — ED Triage Notes (Addendum)
Pt states he was napping/dreaming ~1.5 hours-"woke flapping arms" and fell from bed-pt hit head, chin on table-lac noted to chin, redness to right sclera-denies visual chnages-denies LOC-NAD-steady gait

## 2019-04-26 ENCOUNTER — Telehealth: Payer: Self-pay | Admitting: *Deleted

## 2019-04-26 NOTE — Telephone Encounter (Signed)
Copied from [place] [id number]. Topic: General - Other >> Apr 25, 2019  4:36 PM Antonieta Iba C wrote: Reason for CRM: pt/spouse called in to schedule ED follow up to have stitches removed on Monday. They were advised to see PCP within 2-5 days.   Please assist.

## 2019-04-29 ENCOUNTER — Ambulatory Visit (INDEPENDENT_AMBULATORY_CARE_PROVIDER_SITE_OTHER): Payer: Medicare Other | Admitting: Family Medicine

## 2019-04-29 ENCOUNTER — Encounter: Payer: Self-pay | Admitting: Family Medicine

## 2019-04-29 ENCOUNTER — Other Ambulatory Visit: Payer: Self-pay

## 2019-04-29 VITALS — BP 96/58 | HR 77 | Temp 98.3°F | Ht 72.75 in | Wt 204.5 lb

## 2019-04-29 DIAGNOSIS — S0181XA Laceration without foreign body of other part of head, initial encounter: Secondary | ICD-10-CM

## 2019-04-29 NOTE — Patient Instructions (Signed)
Keep wound clean with soap and water.

## 2019-04-29 NOTE — Progress Notes (Signed)
  Subjective:     Patient ID: Jason [name], male   DOB: 03/02/1941, 78 y.o.   MRN: [id number]  HPI Patient here for ER follow-up for suture removal.  On Wednesday he apparently was having a dream and wife states that his legs were kicking and went off the bed first followed by the rest of his body.  His head struck the nightstand and he had a laceration just below the right eye and involving the chin.  Chin laceration received 6 sutures and one suture below the right eye.  He went to emergency room over in [facility].  CT head no acute findings.  CT neck degenerative changes but no fracture.  He is doing well at this time.  No headaches.  No confusion.  No dizziness.  No other injuries reported.  There was no loss of consciousness following the injury and no concern for any cognitive deficits  Past Medical History:  Diagnosis Date  . Anxiety   . Atrial fibrillation ([place])    past hx  . Colon polyp   . Depression   . GERD (gastroesophageal reflux disease)   . Gout, unspecified   . Hypertension   . Osteoarthrosis, unspecified whether generalized or localized, unspecified site   . Pneumonia, organism unspecified(486)   . Pure hypercholesterolemia   . Routine general medical examination at a health care facility   . Sleep apnea    not using cpap currently  . Unspecified nonpsychotic mental disorder    Past Surgical History:  Procedure Laterality Date  . COLONOSCOPY  10/04/2006   jacobs  . neg hx    . UPPER GASTROINTESTINAL ENDOSCOPY  2015  . WISDOM TOOTH EXTRACTION      reports that he has never smoked. He has never used smokeless tobacco. He reports that he does not drink alcohol or use drugs. family history includes Arthritis in an other family member; Dementia in his mother; Heart failure in his father; Pneumonia in his father. No Known Allergies   Review of Systems  Respiratory: Negative for shortness of breath.   Cardiovascular: Negative for chest pain.  Neurological: Negative  for seizures and headaches.  Psychiatric/Behavioral: Negative for confusion.       Objective:   Physical Exam Constitutional:      Appearance: Normal appearance.  HENT:     Head:     Comments: Patient has contusions of the face with some bruising.  Very small laceration below the right eye which is healing well.  He has laceration right side of chin which is also healing well without signs of secondary infection 1 suture removed from below the right eye and 6 sutures removed from the chin region without difficulty Musculoskeletal:     Right lower leg: No edema.     Left lower leg: No edema.  Neurological:     General: No focal deficit present.     Mental Status: He is alert and oriented to person, place, and time.        Assessment:     Facial lacerations following fall healing well with no signs of secondary infection    Plan:     -Sutures removed -Keep clean with soap and water -Patient will keep his regular scheduled office follow-up and sooner as needed  [name] MD [facility]

## 2019-05-02 ENCOUNTER — Other Ambulatory Visit: Payer: Self-pay | Admitting: Family Medicine

## 2019-08-20 ENCOUNTER — Ambulatory Visit (INDEPENDENT_AMBULATORY_CARE_PROVIDER_SITE_OTHER): Payer: Medicare Other | Admitting: Psychiatry

## 2019-08-20 ENCOUNTER — Other Ambulatory Visit: Payer: Self-pay

## 2019-08-20 ENCOUNTER — Encounter: Payer: Self-pay | Admitting: Psychiatry

## 2019-08-20 DIAGNOSIS — F3342 Major depressive disorder, recurrent, in full remission: Secondary | ICD-10-CM

## 2019-08-20 DIAGNOSIS — F422 Mixed obsessional thoughts and acts: Secondary | ICD-10-CM | POA: Diagnosis not present

## 2019-08-20 DIAGNOSIS — F411 Generalized anxiety disorder: Secondary | ICD-10-CM | POA: Diagnosis not present

## 2019-08-20 NOTE — Progress Notes (Signed)
[name] 1941/03/23 78 y.o.  Subjective:   Patient ID:  Jason [name] is a 78 y.o. (DOB 08-Jul-1941) male.  Chief Complaint:  Chief Complaint  Patient presents with  . Follow-up     Medication Management  . Depression     Medication Management  . Sleeping Problem    Depression        Associated symptoms include no decreased concentration and no suicidal ideas.   Seen with wife. Jason [name] presents to the office today for follow-up of MDE, OCD and GAD.  Last seen February 21, 2019.  No meds were changed.  Doing great with depression and anxiety.  Under control.  W Jason [name]'s F in Hospice care and she helps. F in law 50.  M dementia.  Fine.  Depression and anxiety under control.  Except notices Feb each year a change in disposition but it's minor.  A little anxious without reason.  Patient reports stable mood and denies depressed or irritable moods.   Patient denies difficulty with sleep initiation or maintenance but it is split with some napping.  EMA. Denies appetite disturbance.  Patient reports that energy and motivation have been good.  Patient denies any difficulty with concentration.  Patient denies any suicidal ideation.  Stress caring for inlaws in their 90s.  F good cognition.  M cognitive problems.  Past Psychiatric Medication Trials: Wellbutrin side effects, Lexapro nausea, Abilify, paroxetine, fluoxetine briefly, do duloxetine, ECT, Effexor with a history of benefit, mirtazapine, Pristiq 100, buspirone side effects, sertraline 200, lithium 600, olanzapine 2.5 with benefit.   Review of Systems:  Review of Systems  Neurological: Negative for tremors and weakness.  Psychiatric/Behavioral: Positive for depression. Negative for agitation, behavioral problems, confusion, decreased concentration, dysphoric mood, hallucinations, self-injury, sleep disturbance and suicidal ideas. The patient is nervous/anxious. The patient is not hyperactive.     Medications: I have  reviewed the patient's current medications.  Current Outpatient Medications  Medication Sig Dispense Refill  . amLODipine (NORVASC) 5 MG tablet TAKE 1 TABLET (5 MG TOTAL) BY MOUTH DAILY. 90 tablet 1  . lithium carbonate 300 MG capsule Take 2 capsules (600 mg total) by mouth daily. Takes 2 tablets once per day with dinner. 180 capsule 1  . meloxicam (MOBIC) 15 MG tablet Take 15 mg by mouth as needed for pain.    . Methylfol-Algae-B12-Acetylcyst (CEREFOLIN NAC) 6-90.314-2-600 MG TABS TAKE 1 TABLET BY MOUTH EVERY DAY 90 tablet 3  . sertraline (ZOLOFT) 100 MG tablet Take 2 tablets (200 mg total) by mouth daily. 180 tablet 1  . simvastatin (ZOCOR) 20 MG tablet TAKE 1 TABLET DAILY 90 tablet 1  . Vitamin D, Ergocalciferol, (DRISDOL) 1.25 MG (50000 UT) CAPS capsule TAKE 1 CAPSULE EVERY 7 DAYS 12 capsule 1   No current facility-administered medications for this visit.     Medication Side Effects: None except occ jerks from lithium  Allergies: No Known Allergies  Past Medical History:  Diagnosis Date  . Anxiety   . Atrial fibrillation ([place])    past hx  . Colon polyp   . Depression   . GERD (gastroesophageal reflux disease)   . Gout, unspecified   . Hypertension   . Osteoarthrosis, unspecified whether generalized or localized, unspecified site   . Pneumonia, organism unspecified(486)   . Pure hypercholesterolemia   . Routine general medical examination at a health care facility   . Sleep apnea    not using cpap currently  . Unspecified nonpsychotic mental disorder  Family History  Problem Relation Age of Onset  . Dementia Mother   . Pneumonia Father   . Heart failure Father   . Arthritis Other   . Colon cancer Neg Hx   . Colon polyps Neg Hx   . Esophageal cancer Neg Hx   . Stomach cancer Neg Hx   . Rectal cancer Neg Hx     Social History   Socioeconomic History  . Marital status: Married    Spouse name: [name]  . Number of children: 0  . Years of education: Not on file   . Highest education level: Not on file  Occupational History  . Occupation: retired    Fish farm manager: [employer]  Social Needs  . Financial resource strain: Not on file  . Food insecurity    Worry: Not on file    Inability: Not on file  . Transportation needs    Medical: Not on file    Non-medical: Not on file  Tobacco Use  . Smoking status: Never Smoker  . Smokeless tobacco: Never Used  Substance and Sexual Activity  . Alcohol use: No  . Drug use: No  . Sexual activity: Not on file  Lifestyle  . Physical activity    Days per week: Not on file    Minutes per session: Not on file  . Stress: Not on file  Relationships  . Social Herbalist on phone: Not on file    Gets together: Not on file    Attends religious service: Not on file    Active member of club or organization: Not on file    Attends meetings of clubs or organizations: Not on file    Relationship status: Not on file  . Intimate partner violence    Fear of current or ex partner: Not on file    Emotionally abused: Not on file    Physically abused: Not on file    Forced sexual activity: Not on file  Other Topics Concern  . Not on file  Social History Narrative  . Not on file    Past Medical History, Surgical history, Social history, and Family history were reviewed and updated as appropriate.   Please see review of systems for further details on the patient's review from today.   Objective:   Physical Exam:  There were no vitals taken for this visit.  Physical Exam Constitutional:      General: He is not in acute distress.    Appearance: He is well-developed.  Musculoskeletal:        General: No deformity.  Neurological:     Mental Status: He is alert and oriented to person, place, and time.     Coordination: Coordination normal.  Psychiatric:        Attention and Perception: Attention normal. He is attentive. He does not perceive auditory hallucinations.        Mood and Affect: Mood normal.  Mood is not anxious or depressed. Affect is not labile, blunt, angry or inappropriate.        Speech: Speech normal.        Behavior: Behavior normal.        Thought Content: Thought content normal. Thought content does not include homicidal or suicidal ideation.        Cognition and Memory: Cognition normal.        Judgment: Judgment normal.     Comments: Insight is fair to good.     Lab Review:  Component Value Date/Time   NA 138 09/03/2018 1410   K 4.5 09/03/2018 1410   CL 105 09/03/2018 1410   CO2 26 09/03/2018 1410   GLUCOSE 87 09/03/2018 1410   BUN 16 09/03/2018 1410   CREATININE 1.09 09/03/2018 1410   CALCIUM 10.0 09/03/2018 1410   PROT 6.8 03/06/2018 1343   ALBUMIN 4.4 03/06/2018 1343   AST 21 03/06/2018 1343   ALT 20 03/06/2018 1343   ALKPHOS 67 03/06/2018 1343   BILITOT 0.6 03/06/2018 1343   GFRNONAA 102.71 11/20/2009 0956   GFRAA 125 07/16/2008 0000       Component Value Date/Time   WBC 7.4 01/13/2014 1134   RBC 4.76 01/13/2014 1134   HGB 14.5 01/13/2014 1134   HCT 43.2 01/13/2014 1134   PLT 237.0 01/13/2014 1134   MCV 90.7 01/13/2014 1134   MCHC 33.5 01/13/2014 1134   RDW 13.3 01/13/2014 1134   LYMPHSABS 1.8 01/13/2014 1134   MONOABS 0.4 01/13/2014 1134   EOSABS 0.3 01/13/2014 1134   BASOSABS 0.0 01/13/2014 1134   Normal TSH in September.  Last lithium 0.8 in July 2019  Lithium level January 10, 2027 0.5  Lithium Lvl  Date Value Ref Range Status  01/10/2019 0.5 (L) 0.6 - 1.2 mmol/L Final     No results found for: PHENYTOIN, PHENOBARB, VALPROATE, CBMZ   .res Assessment: Plan:    Recurrent major depression in complete remission ([place])  Mixed obsessional thoughts and acts  Generalized anxiety disorder   Mr. Peduzzi has a long history of the above diagnoses which have been treatment resistant at times and multiple relapses.  He ultimately responded to the combination of sertraline and lithium and Zyprexa.  He has been able to wean off the  Zyprexa without complication.  He's continuing to do well.  As noted multiple failed meds and a history of treatment resistance. Each diagnosis under control and meds helping.  Supportive therapy dealing with sick in-laws and caretaking.  Encourage self-care and recreation.  Counseled patient regarding potential benefits, risks, and side effects of lithium to include potential risk of lithium affecting thyroid and renal function.  Discussed need for periodic lab monitoring to determine drug level and to assess for potential adverse effects.  Counseled patient regarding signs and symptoms of lithium toxicity and advised that they notify office immediately or seek urgent medical attention if experiencing these signs and symptoms.  Patient advised to contact office with any questions or concerns.  Call if any changes in BP meds.  Disc DDI  Lithium level is in the low normal range and is typical for the levels he has had over the last couple of years.  It has been effective at this low dosage so we will make no med changes.  Disc the difference between Cerefolin NAC and Metafolbic plus.  No med changes made. High relapse risk if reduce meds.  This appt was 30 mins.  FU 6 mos  Lynder Parents, MD, DFAPA     Please see After Visit Summary for patient specific instructions.  Future Appointments  Date Time Provider [place]  09/06/2019  1:30 PM [name], MD [facility]  09/24/2019 11:00 AM [facility]    No orders of the defined types were placed in this encounter.     -------------------------------

## 2019-09-06 ENCOUNTER — Other Ambulatory Visit: Payer: Self-pay

## 2019-09-06 ENCOUNTER — Ambulatory Visit (INDEPENDENT_AMBULATORY_CARE_PROVIDER_SITE_OTHER): Payer: Medicare Other | Admitting: Family Medicine

## 2019-09-06 DIAGNOSIS — Z79899 Other long term (current) drug therapy: Secondary | ICD-10-CM | POA: Diagnosis not present

## 2019-09-06 DIAGNOSIS — E7800 Pure hypercholesterolemia, unspecified: Secondary | ICD-10-CM | POA: Diagnosis not present

## 2019-09-06 DIAGNOSIS — I10 Essential (primary) hypertension: Secondary | ICD-10-CM

## 2019-09-06 NOTE — Progress Notes (Signed)
Patient ID: Jason [name], male   DOB: 1941/07/08, 78 y.o.   MRN: [id number]  This visit type was conducted due to national recommendations for restrictions regarding the COVID-19 pandemic in an effort to limit this patient's exposure and mitigate transmission in our community.   Virtual Visit via Telephone Note  I connected with Jason [name] on 09/06/19 at  1:30 PM EDT by telephone and verified that I am speaking with the correct person using two identifiers.   I discussed the limitations, risks, security and privacy concerns of performing an evaluation and management service by telephone and the availability of in person appointments. I also discussed with the patient that there may be a patient responsible charge related to this service. The patient expressed understanding and agreed to proceed.  Location patient: home Location provider: work or home office Participants present for the call: patient, provider Patient did not have a visit in the prior 7 days to address this/these issue(s).   History of Present Illness: Patient is for routine medical follow-up.  Hypertension, hyperlipidemia, history of recurrent depression, generalized anxiety.  He is followed by psychiatry and is maintained on lithium and also sertraline.  He feels that his anxiety and depression symptoms are relatively stable.  Compliant with medications.  Denies any medication side effects.  No recent falls.  Due for follow-up labs.   Observations/Objective: Patient sounds cheerful and well on the phone. I do not appreciate any SOB. Speech and thought processing are grossly intact. Patient reported vitals:  Assessment and Plan:  #1 hypertension which has been stable -Check basic metabolic panel  #2 hyperlipidemia -Check lipid and hepatic panel  #3 high risk medication with lithium -Check TSH  Follow Up Instructions:  -This coming Monday at 11 AM for labs   [id number] 5-10 [id number] 11-20 99443 21-30 I did not refer  this patient for an OV in the next 24 hours for this/these issue(s).  I discussed the assessment and treatment plan with the patient. The patient was provided an opportunity to ask questions and all were answered. The patient agreed with the plan and demonstrated an understanding of the instructions.   The patient was advised to call back or seek an in-person evaluation if the symptoms worsen or if the condition fails to improve as anticipated.  I provided 23 minutes of non-face-to-face time during this encounter.   Jason Littler, MD

## 2019-09-09 ENCOUNTER — Other Ambulatory Visit: Payer: Self-pay

## 2019-09-10 DIAGNOSIS — H2513 Age-related nuclear cataract, bilateral: Secondary | ICD-10-CM | POA: Diagnosis not present

## 2019-09-10 DIAGNOSIS — H524 Presbyopia: Secondary | ICD-10-CM | POA: Diagnosis not present

## 2019-09-11 ENCOUNTER — Other Ambulatory Visit: Payer: Self-pay

## 2019-09-11 ENCOUNTER — Other Ambulatory Visit (INDEPENDENT_AMBULATORY_CARE_PROVIDER_SITE_OTHER): Payer: Medicare Other

## 2019-09-11 ENCOUNTER — Encounter: Payer: Self-pay | Admitting: Family Medicine

## 2019-09-11 DIAGNOSIS — I10 Essential (primary) hypertension: Secondary | ICD-10-CM

## 2019-09-11 DIAGNOSIS — E7800 Pure hypercholesterolemia, unspecified: Secondary | ICD-10-CM

## 2019-09-11 DIAGNOSIS — Z79899 Other long term (current) drug therapy: Secondary | ICD-10-CM | POA: Diagnosis not present

## 2019-09-11 LAB — HEPATIC FUNCTION PANEL
ALT: 12 U/L (ref 0–53)
AST: 18 U/L (ref 0–37)
Albumin: 4.5 g/dL (ref 3.5–5.2)
Alkaline Phosphatase: 77 U/L (ref 39–117)
Bilirubin, Direct: 0.1 mg/dL (ref 0.0–0.3)
Total Bilirubin: 0.5 mg/dL (ref 0.2–1.2)
Total Protein: 6.5 g/dL (ref 6.0–8.3)

## 2019-09-11 LAB — BASIC METABOLIC PANEL
BUN: 16 mg/dL (ref 6–23)
CO2: 27 mEq/L (ref 19–32)
Calcium: 10 mg/dL (ref 8.4–10.5)
Chloride: 106 mEq/L (ref 96–112)
Creatinine, Ser: 0.99 mg/dL (ref 0.40–1.50)
GFR: 73.48 mL/min (ref >60.00)
Glucose, Bld: 80 mg/dL (ref 70–99)
Potassium: 4.3 mEq/L (ref 3.5–5.1)
Sodium: 141 mEq/L (ref 135–145)

## 2019-09-11 LAB — LIPID PANEL
Cholesterol: 186 mg/dL (ref 0–200)
HDL: 67.9 mg/dL (ref >39.00)
LDL Cholesterol: 105 mg/dL — ABNORMAL HIGH (ref 0–99)
NonHDL: 117.81
Total CHOL/HDL Ratio: 3
Triglycerides: 66 mg/dL (ref 0.0–149.0)
VLDL: 13.2 mg/dL (ref 0.0–40.0)

## 2019-09-11 LAB — TSH: TSH: 2.73 u[IU]/mL (ref 0.35–4.50)

## 2019-09-24 ENCOUNTER — Ambulatory Visit: Payer: Self-pay

## 2019-10-03 ENCOUNTER — Other Ambulatory Visit: Payer: Self-pay | Admitting: Family Medicine

## 2019-10-09 ENCOUNTER — Other Ambulatory Visit: Payer: Self-pay | Admitting: Family Medicine

## 2019-11-12 ENCOUNTER — Other Ambulatory Visit: Payer: Self-pay

## 2019-11-12 DIAGNOSIS — Z20828 Contact with and (suspected) exposure to other viral communicable diseases: Secondary | ICD-10-CM | POA: Diagnosis not present

## 2019-11-12 DIAGNOSIS — Z20822 Contact with and (suspected) exposure to covid-19: Secondary | ICD-10-CM

## 2019-11-13 LAB — NOVEL CORONAVIRUS, NAA: SARS-CoV-2, NAA: NOT DETECTED

## 2019-11-20 ENCOUNTER — Other Ambulatory Visit: Payer: Self-pay

## 2019-11-25 ENCOUNTER — Other Ambulatory Visit: Payer: Self-pay

## 2019-11-25 DIAGNOSIS — Z20822 Contact with and (suspected) exposure to covid-19: Secondary | ICD-10-CM

## 2019-11-25 DIAGNOSIS — Z20828 Contact with and (suspected) exposure to other viral communicable diseases: Secondary | ICD-10-CM | POA: Diagnosis not present

## 2019-11-26 LAB — NOVEL CORONAVIRUS, NAA: SARS-CoV-2, NAA: DETECTED — AB

## 2019-11-28 ENCOUNTER — Telehealth: Payer: Self-pay | Admitting: Nurse Practitioner

## 2019-11-28 ENCOUNTER — Telehealth: Payer: Self-pay | Admitting: Family Medicine

## 2019-11-28 NOTE — Telephone Encounter (Signed)
Copied from [place] [phone]. Topic: General - Other >> Nov 28, 2019  9:09 AM Jodie Echevaria wrote: Reason for CRM: Patient wife called to say that her husband was diagnosed with positive covid this week and they were informed by a nurse that he was eligible for the treatment program due to his age and him being a patient with high BP. She is calling to get Dr Anastasio Auerbach input on if he think its a good idea for him to go through this treatment process. Please call patient at Ph# [phone]

## 2019-11-28 NOTE — Telephone Encounter (Signed)
Called to Discuss with patient about Covid symptoms and the use of bamlanivimab, a monoclonal antibody infusion for those with mild to moderate Covid symptoms and at a high risk of hospitalization.     Pt is qualified for this infusion at the [facility] infusion center due to co-morbid conditions and/or a member of an at-risk group.    Patient is currently being managed for the following problems:  Patient Active Problem List   Diagnosis Date Noted  . GAD (generalized anxiety disorder) 12/23/2018  . At high risk for falls 09/03/2018  . Depression 08/18/2015  . OSA (obstructive sleep apnea) 01/19/2015  . Vitamin D deficiency 01/07/2013  . Need for prophylactic vaccination and inoculation against influenza 11/14/2011  . Hypertension 11/14/2011  . Osteoarthritis 11/20/2009  . PSYCHIATRIC DISORDER 07/16/2008  . History of pneumonia 07/16/2008  . History of atrial fibrillation   . HYPERCHOLESTEROLEMIA       Patient will think about it and call me back.

## 2019-11-29 ENCOUNTER — Other Ambulatory Visit: Payer: Self-pay | Admitting: Nurse Practitioner

## 2019-11-29 DIAGNOSIS — U071 COVID-19: Secondary | ICD-10-CM

## 2019-11-29 DIAGNOSIS — I10 Essential (primary) hypertension: Secondary | ICD-10-CM

## 2019-11-29 NOTE — Telephone Encounter (Signed)
I called his wife and she just wanted to let you know that patient is having the antibody infusion done. Sending as Juluis Rainier!!!!

## 2019-11-29 NOTE — Progress Notes (Signed)
  I connected by phone with Jason [name] on 11/29/2019 at 8:40 AM to discuss the potential use of an new treatment for mild to moderate COVID-19 viral infection in non-hospitalized patients.  This patient is a 78 y.o. male that meets the FDA criteria for Emergency Use Authorization of bamlanivimab:  Has a (+) direct SARS-CoV-2 viral test result  Has mild or moderate COVID-19   Is ? 76 years of age and weighs ? 40 kg  Is NOT hospitalized due to COVID-19  Is NOT requiring oxygen therapy or requiring an increase in baseline oxygen flow rate due to COVID-19  Is within 10 days of symptom onset  Has at least one of the high risk factor(s) for progression to severe COVID-19 and/or hospitalization as defined in EUA.  Specific high risk criteria : Hypertension Patient is being managed for the following: Patient Active Problem List   Diagnosis Date Noted  . GAD (generalized anxiety disorder) 12/23/2018  . At high risk for falls 09/03/2018  . Depression 08/18/2015  . OSA (obstructive sleep apnea) 01/19/2015  . Vitamin D deficiency 01/07/2013  . Need for prophylactic vaccination and inoculation against influenza 11/14/2011  . Hypertension 11/14/2011  . Osteoarthritis 11/20/2009  . PSYCHIATRIC DISORDER 07/16/2008  . History of pneumonia 07/16/2008  . History of atrial fibrillation   . HYPERCHOLESTEROLEMIA     I have spoken and communicated the following to the patient or parent/caregiver:  1. FDA has authorized the emergency use of bamlanivimab for the treatment of mild to moderate COVID-19 in adults and pediatric patients with positive results of direct SARS-CoV-2 viral testing who are 58 years of age and older weighing at least 40 kg, and who are at high risk for progressing to severe COVID-19 and/or hospitalization.  2. The significant known and potential risks and benefits of bamlanivimab, and the extent to which such potential risks and benefits are unknown.  3. Information on  available alternative treatments and the risks and benefits of those alternatives, including clinical trials.  4. Patients treated with bamlanivimab should continue to self-isolate and use infection control measures (e.g., wear mask, isolate, social distance, avoid sharing personal items, clean and disinfect "high touch" surfaces, and frequent handwashing) according to CDC guidelines.   5. The patient or parent/caregiver has the option to accept or refuse bamlanivimab.  After reviewing this information with the patient, The patient agreed to proceed with receiving the infusion of bamlanivimab and will be provided a copy of the Fact sheet prior to receiving the infusion.  [name] 11/29/2019 8:40 AM

## 2019-11-29 NOTE — Telephone Encounter (Signed)
noted 

## 2019-12-02 ENCOUNTER — Ambulatory Visit (HOSPITAL_COMMUNITY)
Admission: RE | Admit: 2019-12-02 | Discharge: 2019-12-02 | Disposition: A | Discharge status: Home / Self Care | Payer: Medicare Other | Source: Ambulatory Visit | Attending: Critical Care Medicine | Admitting: Critical Care Medicine

## 2019-12-02 ENCOUNTER — Encounter (HOSPITAL_COMMUNITY): Payer: Self-pay

## 2019-12-02 DIAGNOSIS — U071 COVID-19: Secondary | ICD-10-CM | POA: Diagnosis not present

## 2019-12-02 DIAGNOSIS — Z23 Encounter for immunization: Secondary | ICD-10-CM | POA: Diagnosis not present

## 2019-12-02 DIAGNOSIS — I10 Essential (primary) hypertension: Secondary | ICD-10-CM | POA: Diagnosis not present

## 2019-12-02 MED ORDER — METHYLPREDNISOLONE SODIUM SUCC 125 MG IJ SOLR: 125.0000 mg | Freq: Once | INTRAMUSCULAR | Status: DC | PRN

## 2019-12-02 MED ORDER — ALBUTEROL SULFATE HFA 108 (90 BASE) MCG/ACT IN AERS: 2.0000 | INHALATION_SPRAY | Freq: Once | RESPIRATORY_TRACT | Status: DC | PRN

## 2019-12-02 MED ORDER — SODIUM CHLORIDE 0.9 % IV SOLN
700.0000 mg | Freq: Once | INTRAVENOUS | Status: AC
  Administered 2019-12-02: 12:00:00 700 mg via INTRAVENOUS
  Filled 2019-12-02: qty 20

## 2019-12-02 MED ORDER — SODIUM CHLORIDE 0.9 % IV SOLN
INTRAVENOUS | Status: DC | PRN
  Administered 2019-12-02: 12:00:00 via INTRAVENOUS

## 2019-12-02 MED ORDER — DIPHENHYDRAMINE HCL 50 MG/ML IJ SOLN: 50.0000 mg | Freq: Once | INTRAMUSCULAR | Status: DC | PRN

## 2019-12-02 MED ORDER — FAMOTIDINE IN NACL 20-0.9 MG/50ML-% IV SOLN: 20.0000 mg | Freq: Once | INTRAVENOUS | Status: DC | PRN

## 2019-12-02 MED ORDER — EPINEPHRINE 0.3 MG/0.3ML IJ SOAJ: 0.3000 mg | Freq: Once | INTRAMUSCULAR | Status: DC | PRN

## 2019-12-02 NOTE — Progress Notes (Signed)
Patient ID: Jason [name], male   DOB: 1941-11-12, 78 y.o.   MRN: [id number]    Diagnosis: [id number]  Procedure: Covid Infusion Clinic Med: bamlanivimab infusion - Provided patient with bamlanimivab fact sheet for patients, parents and caregivers prior to infusion.  Complications: No immediate complications noted.  Discharge: Discharged home   Estrella Deeds 12/02/2019

## 2019-12-13 ENCOUNTER — Other Ambulatory Visit: Payer: Self-pay | Admitting: Psychiatry

## 2019-12-13 ENCOUNTER — Other Ambulatory Visit: Payer: Self-pay | Admitting: Family Medicine

## 2020-01-13 ENCOUNTER — Other Ambulatory Visit: Payer: Self-pay | Admitting: Family Medicine

## 2020-01-13 NOTE — Telephone Encounter (Signed)
Not on current med list. Do you know if patient is supposed to still take this?

## 2020-01-13 NOTE — Telephone Encounter (Signed)
I would confirm if he is still taking this.  Not sure why not  on list.  If taking all along, OK to refill for one year.

## 2020-01-27 ENCOUNTER — Other Ambulatory Visit: Payer: Self-pay | Admitting: Psychiatry

## 2020-03-02 ENCOUNTER — Ambulatory Visit: Payer: Medicare Other | Attending: Internal Medicine

## 2020-03-02 DIAGNOSIS — Z23 Encounter for immunization: Secondary | ICD-10-CM | POA: Insufficient documentation

## 2020-03-02 NOTE — Progress Notes (Signed)
   Covid-19 Vaccination Clinic  Name:  Jason [name]    MRN: [id number] DOB: 10-Apr-1941  03/02/2020  [name] was observed post Covid-19 immunization for 15 minutes without incident. He was provided with Vaccine Information Sheet and instruction to access the V-Safe system.   [name] was instructed to call 911 with any severe reactions post vaccine: Marland Kitchen Difficulty breathing  . Swelling of face and throat  . A fast heartbeat  . A bad rash all over body  . Dizziness and weakness   Immunizations Administered    Name Date Dose VIS Date Route   Pfizer COVID-19 Vaccine 03/02/2020 10:58 AM 0.3 mL 12/06/2019 Intramuscular   Manufacturer: [place]   Lot: [id number]   [place]: [id number]

## 2020-03-10 DIAGNOSIS — H903 Sensorineural hearing loss, bilateral: Secondary | ICD-10-CM | POA: Diagnosis not present

## 2020-03-10 DIAGNOSIS — J343 Hypertrophy of nasal turbinates: Secondary | ICD-10-CM | POA: Diagnosis not present

## 2020-03-10 DIAGNOSIS — H6123 Impacted cerumen, bilateral: Secondary | ICD-10-CM | POA: Insufficient documentation

## 2020-03-11 ENCOUNTER — Other Ambulatory Visit: Payer: Self-pay | Admitting: Psychiatry

## 2020-03-22 ENCOUNTER — Other Ambulatory Visit: Payer: Self-pay | Admitting: Psychiatry

## 2020-04-01 ENCOUNTER — Ambulatory Visit: Payer: Medicare Other | Attending: Internal Medicine

## 2020-04-01 DIAGNOSIS — Z23 Encounter for immunization: Secondary | ICD-10-CM

## 2020-04-01 NOTE — Progress Notes (Signed)
   Covid-19 Vaccination Clinic  Name:  Jason [name]    MRN: [id number] DOB: 10/18/1941  04/01/2020  [name] was observed post Covid-19 immunization for 15 minutes without incident. He was provided with Vaccine Information Sheet and instruction to access the V-Safe system.   [name] was instructed to call 911 with any severe reactions post vaccine: Marland Kitchen Difficulty breathing  . Swelling of face and throat  . A fast heartbeat  . A bad rash all over body  . Dizziness and weakness   Immunizations Administered    Name Date Dose VIS Date Route   Pfizer COVID-19 Vaccine 04/01/2020 11:29 AM 0.3 mL 12/06/2019 Intramuscular   Manufacturer: [employer]   Lot: [id number]   [place]: [id number]

## 2020-04-05 ENCOUNTER — Other Ambulatory Visit: Payer: Self-pay | Admitting: Family Medicine

## 2020-04-08 DIAGNOSIS — H903 Sensorineural hearing loss, bilateral: Secondary | ICD-10-CM | POA: Diagnosis not present

## 2020-04-11 ENCOUNTER — Other Ambulatory Visit: Payer: Self-pay | Admitting: Family Medicine

## 2020-04-19 ENCOUNTER — Encounter (HOSPITAL_BASED_OUTPATIENT_CLINIC_OR_DEPARTMENT_OTHER): Payer: Self-pay | Admitting: Emergency Medicine

## 2020-04-19 ENCOUNTER — Emergency Department (HOSPITAL_BASED_OUTPATIENT_CLINIC_OR_DEPARTMENT_OTHER)
Admission: EM | Admit: 2020-04-19 | Discharge: 2020-04-19 | Disposition: A | Discharge status: Home / Self Care | Payer: Medicare Other | Attending: Emergency Medicine | Admitting: Emergency Medicine

## 2020-04-19 ENCOUNTER — Other Ambulatory Visit: Payer: Self-pay

## 2020-04-19 ENCOUNTER — Emergency Department (HOSPITAL_BASED_OUTPATIENT_CLINIC_OR_DEPARTMENT_OTHER): Payer: Medicare Other

## 2020-04-19 DIAGNOSIS — I10 Essential (primary) hypertension: Secondary | ICD-10-CM | POA: Insufficient documentation

## 2020-04-19 DIAGNOSIS — R1032 Left lower quadrant pain: Secondary | ICD-10-CM | POA: Diagnosis not present

## 2020-04-19 DIAGNOSIS — Z79899 Other long term (current) drug therapy: Secondary | ICD-10-CM | POA: Diagnosis not present

## 2020-04-19 DIAGNOSIS — K5900 Constipation, unspecified: Secondary | ICD-10-CM | POA: Insufficient documentation

## 2020-04-19 LAB — URINALYSIS, ROUTINE W REFLEX MICROSCOPIC
Bilirubin Urine: NEGATIVE
Glucose, UA: NEGATIVE mg/dL
Hgb urine dipstick: NEGATIVE
Ketones, ur: NEGATIVE mg/dL
Leukocytes,Ua: NEGATIVE
Nitrite: NEGATIVE
Protein, ur: NEGATIVE mg/dL
Specific Gravity, Urine: <1.005 — ABNORMAL LOW (ref 1.005–1.030)
pH: 7 (ref 5.0–8.0)

## 2020-04-19 LAB — COMPREHENSIVE METABOLIC PANEL
ALT: 19 U/L (ref 0–44)
AST: 23 U/L (ref 15–41)
Albumin: 4.4 g/dL (ref 3.5–5.0)
Alkaline Phosphatase: 57 U/L (ref 38–126)
Anion gap: 9 (ref 5–15)
BUN: 16 mg/dL (ref 8–23)
CO2: 20 mmol/L — ABNORMAL LOW (ref 22–32)
Calcium: 9.7 mg/dL (ref 8.9–10.3)
Chloride: 109 mmol/L (ref 98–111)
Creatinine, Ser: 1.15 mg/dL (ref 0.61–1.24)
GFR calc Af Amer: >60 mL/min (ref >60)
GFR calc non Af Amer: >60 mL/min (ref >60)
Glucose, Bld: 92 mg/dL (ref 70–99)
Potassium: 4.3 mmol/L (ref 3.5–5.1)
Sodium: 138 mmol/L (ref 135–145)
Total Bilirubin: 0.8 mg/dL (ref 0.3–1.2)
Total Protein: 7.1 g/dL (ref 6.5–8.1)

## 2020-04-19 LAB — CBC
HCT: 39.6 % (ref 39.0–52.0)
Hemoglobin: 13.2 g/dL (ref 13.0–17.0)
MCH: 32.3 pg (ref 26.0–34.0)
MCHC: 33.3 g/dL (ref 30.0–36.0)
MCV: 96.8 fL (ref 80.0–100.0)
Platelets: 199 10*3/uL (ref 150–400)
RBC: 4.09 MIL/uL — ABNORMAL LOW (ref 4.22–5.81)
RDW: 12.6 % (ref 11.5–15.5)
WBC: 6.7 10*3/uL (ref 4.0–10.5)
nRBC: 0 % (ref 0.0–0.2)

## 2020-04-19 LAB — LIPASE, BLOOD: Lipase: 24 U/L (ref 11–51)

## 2020-04-19 MED ORDER — SODIUM CHLORIDE 0.9 % IV BOLUS
500.0000 mL | Freq: Once | INTRAVENOUS | Status: AC
  Administered 2020-04-19: 500 mL via INTRAVENOUS

## 2020-04-19 MED ORDER — SENNOSIDES-DOCUSATE SODIUM 8.6-50 MG PO TABS: 2.0000 | ORAL_TABLET | Freq: Every day | ORAL | 0 refills | Status: DC | PRN

## 2020-04-19 MED ORDER — IOHEXOL 300 MG/ML  SOLN
100.0000 mL | Freq: Once | INTRAMUSCULAR | Status: AC | PRN
  Administered 2020-04-19: 14:00:00 100 mL via INTRAVENOUS

## 2020-04-19 MED ORDER — POLYETHYLENE GLYCOL 3350 17 G PO PACK: 17.0000 g | PACK | Freq: Every day | ORAL | 0 refills | Status: DC | PRN

## 2020-04-19 NOTE — Discharge Instructions (Addendum)
You were seen in the ER today for abdominal pain and constipation.  Your work-up in the emergency department was overall reassuring.  Your labs did not show any significant abnormalities.  Your CT scan did not show any significant new findings, you do have some stool in your colon, we suspect your symptoms are most likely related to constipation.  Your CT scan did show that you have some degenerative changes in your back and hip as well as some plaque in your blood vessels, please discuss this with your primary care provider.  We are sending you home with the following medicines to help with your symptoms including MiraLAX and senokot-S.  Please start by taking each of these once daily-1 packet of MiraLAX and 2 tablets of Senokot-S, if you are still unable to have a fairly normal bowel movement you may increase each of these medications to twice per day as needed.  We have prescribed you new medication(s) today. Discuss the medications prescribed today with your pharmacist as they can have adverse effects and interactions with your other medicines including over the counter and prescribed medications. Seek medical evaluation if you start to experience new or abnormal symptoms after taking one of these medicines, seek care immediately if you start to experience difficulty breathing, feeling of your throat closing, facial swelling, or rash as these could be indications of a more serious allergic reaction  Please follow attached diet guidelines.  Please follow-up with your primary care provider within 3 days for reevaluation.  Return to the ER for new or worsening symptoms including but not limited to increased pain, change in quality/location of pain, inability to have a bowel movement, blood in your stool, inability to keep fluids down, fever, or any other concerns.

## 2020-04-19 NOTE — ED Triage Notes (Addendum)
Pt here with constipation and left sided abdominal pain since Wednesday. LBM 4/20

## 2020-04-19 NOTE — ED Provider Notes (Signed)
[facility] Provider Note   CSN: [id number] Arrival date & time: 04/19/20  1125     History Chief Complaint  Patient presents with   Abdominal Pain   Constipation    Jason [name] is a 79 y.o. male with a history of hypertension, atrial fibrillation, hypercholesterolemia, sleep apnea, GERD, and anxiety who presents to the emergency department with complaints of constipation & abdominal pain for the past 5 days. Patient states that his last BM was 04/20, since then he has had 1 bowel movement 2 days prior which was very small and firm. Tried miralax once without relief. Has had pain to the LLQ intermittently with constipation, feels like sharp/stabbing pain, more when up and moving, no alleviating factors. No pain at present. Denies fever, chills, nausea, vomiting, dysuria, hematuria, melena, hematochezia, bright red blood per rectum, chest pain, or dyspnea.  HPI     Past Medical History:  Diagnosis Date   Anxiety    Atrial fibrillation ([place])    past hx   Colon polyp    Depression    GERD (gastroesophageal reflux disease)    Gout, unspecified    Hypertension    Osteoarthrosis, unspecified whether generalized or localized, unspecified site    Pneumonia, organism unspecified(486)    Pure hypercholesterolemia    Routine general medical examination at a health care facility    Sleep apnea    not using cpap currently   Unspecified nonpsychotic mental disorder     Patient Active Problem List   Diagnosis Date Noted   GAD (generalized anxiety disorder) 12/23/2018   At high risk for falls 09/03/2018   Depression 08/18/2015   OSA (obstructive sleep apnea) 01/19/2015   Vitamin D deficiency 01/07/2013   Need for prophylactic vaccination and inoculation against influenza 11/14/2011   Hypertension 11/14/2011   Osteoarthritis 11/20/2009   PSYCHIATRIC DISORDER 07/16/2008   History of pneumonia 07/16/2008   History of atrial  fibrillation    HYPERCHOLESTEROLEMIA     Past Surgical History:  Procedure Laterality Date   COLONOSCOPY  10/04/2006   jacobs   neg hx     UPPER GASTROINTESTINAL ENDOSCOPY  2015   WISDOM TOOTH EXTRACTION         Family History  Problem Relation Age of Onset   Dementia Mother    Pneumonia Father    Heart failure Father    Arthritis Other    Colon cancer Neg Hx    Colon polyps Neg Hx    Esophageal cancer Neg Hx    Stomach cancer Neg Hx    Rectal cancer Neg Hx     Social History   Tobacco Use   Smoking status: Never Smoker   Smokeless tobacco: Never Used  Substance Use Topics   Alcohol use: No   Drug use: No    Home Medications Prior to Admission medications   Medication Sig Start Date End Date Taking? Authorizing Provider  amLODipine (NORVASC) 5 MG tablet TAKE 1 TABLET BY MOUTH EVERY DAY 04/13/20   Burchette, Alinda Sierras, MD  lithium carbonate 300 MG capsule TAKE 2 CAPSULES BY MOUTH EVERY DAY WITH DINNER. 01/27/20   Cottle, Billey Co., MD  meloxicam (MOBIC) 15 MG tablet Take 15 mg by mouth as needed for pain.    [provider]  Methylfol-Algae-B12-Acetylcyst (CEREFOLIN NAC) [id number] MG TABS TAKE 1 TABLET BY MOUTH EVERY DAY 03/11/20   Cottle, Billey Co., MD  MYRBETRIQ 25 MG TB24 tablet TAKE 1 TABLET BY MOUTH  EVERY DAY 01/14/20   Burchette, Alinda Sierras, MD  sertraline (ZOLOFT) 100 MG tablet TAKE 2 TABLETS BY MOUTH EVERY DAY 03/22/20   Cottle, Billey Co., MD  simvastatin (ZOCOR) 20 MG tablet TAKE 1 TABLET DAILY 12/13/19   Burchette, Alinda Sierras, MD  Vitamin D, Ergocalciferol, (DRISDOL) 1.25 MG (50000 UNIT) CAPS capsule TAKE 1 CAPSULE EVERY 7 DAYS 04/06/20   Burchette, Alinda Sierras, MD    Allergies    Patient has no known allergies.  Review of Systems   Review of Systems  Constitutional: Negative for chills and fever.  Respiratory: Negative for shortness of breath.   Cardiovascular: Negative for chest pain.  Gastrointestinal: Positive for abdominal  pain and constipation. Negative for anal bleeding, blood in stool, diarrhea, nausea and vomiting.  Genitourinary: Negative for dysuria, scrotal swelling and testicular pain.  Neurological: Negative for syncope.  All other systems reviewed and are negative.   Physical Exam Updated Vital Signs BP 106/83    Pulse 68    Temp 98.7 F (37.1 C) (Oral)    Resp 16    SpO2 97%   Physical Exam Vitals and nursing note reviewed.  Constitutional:      General: He is not in acute distress.    Appearance: He is well-developed. He is not toxic-appearing.  HENT:     Head: Normocephalic and atraumatic.  Eyes:     General:        Right eye: No discharge.        Left eye: No discharge.     Conjunctiva/sclera: Conjunctivae normal.  Cardiovascular:     Rate and Rhythm: Normal rate and regular rhythm.  Pulmonary:     Effort: Pulmonary effort is normal. No respiratory distress.     Breath sounds: Normal breath sounds. No wheezing, rhonchi or rales.  Abdominal:     General: There is no distension.     Palpations: Abdomen is soft.     Tenderness: There is abdominal tenderness in the left lower quadrant. There is no guarding or rebound.  Musculoskeletal:     Cervical back: Neck supple.  Skin:    General: Skin is warm and dry.     Findings: No rash.  Neurological:     Mental Status: He is alert.     Comments: Clear speech.   Psychiatric:        Behavior: Behavior normal.    ED Results / Procedures / Treatments   Labs (all labs ordered are listed, but only abnormal results are displayed) Labs Reviewed  COMPREHENSIVE METABOLIC PANEL - Abnormal; Notable for the following components:      Result Value   CO2 20 (*)    All other components within normal limits  CBC - Abnormal; Notable for the following components:   RBC 4.09 (*)    All other components within normal limits  URINALYSIS, ROUTINE W REFLEX MICROSCOPIC - Abnormal; Notable for the following components:   Color, Urine STRAW (*)     Specific Gravity, Urine <1.005 (*)    All other components within normal limits  LIPASE, BLOOD    EKG None  Radiology CT Abdomen Pelvis W Contrast  Result Date: 04/19/2020 CLINICAL DATA:  79 year old male with left lower quadrant abdominal pain for 4-5 days with nausea. EXAM: CT ABDOMEN AND PELVIS WITH CONTRAST TECHNIQUE: Multidetector CT imaging of the abdomen and pelvis was performed using the standard protocol following bolus administration of intravenous contrast. CONTRAST:  144mL OMNIPAQUE IOHEXOL 300 MG/ML  SOLN COMPARISON:  None. FINDINGS: Lower chest: Calcified pericardium on series 2, image 1, but no pericardial effusion. Heart size within normal limits. Negative lung bases, no pleural effusions. Borderline to mild gastric hiatal hernia. Hepatobiliary: Negative liver and gallbladder. No bile duct enlargement. Pancreas: Atrophied but otherwise negative. Spleen: Negative. Adrenals/Urinary Tract: Normal adrenal glands. Bilateral renal enhancement and contrast excretion is symmetric and within normal limits. Tiny hypodense areas in both kidneys compatible with benign cysts. No nephrolithiasis identified. Decompressed and normal left ureter to the bladder. Numerous incidental pelvic phleboliths. Unremarkable urinary bladder. Stomach/Bowel: Negative descending and rectosigmoid colon. No diverticulosis or inflammation in those segments. Mild retained stool. Redundant transverse and sigmoid colon. Normal retrocecal appendix on series 2, image 42. Decompressed and negative terminal ileum. No dilated small bowel. Possible small hiatal hernia but otherwise negative stomach. No free air, free fluid, or mesenteric stranding. Vascular/Lymphatic: Calcified aortic atherosclerosis. Major arterial structures are patent. Portal venous system is patent. No lymphadenopathy. Reproductive: Negative. Other: No pelvic free fluid. Musculoskeletal: No acute osseous abnormality identified. Lumbar spine degeneration.  Advanced right hip joint degeneration with bulky subchondral cysts. IMPRESSION: 1. No acute or inflammatory process identified in the abdomen or pelvis. Normal appendix. 2. Sequelae of Calcific Pericarditis.  No pericardial effusion. 3. Advanced right hip joint degeneration. Lumbar spine degeneration. 4. Aortic Atherosclerosis (ICD10-I70.0). Electronically Signed   By: Genevie Ann M.D.   On: 04/19/2020 14:49    Procedures Procedures (including critical care time)  Medications Ordered in ED Medications - No data to display  ED Course  I have reviewed the triage vital signs and the nursing notes.  Pertinent labs & imaging results that were available during my care of the patient were reviewed by me and considered in my medical decision making (see chart for details).    MDM Rules/Calculators/A&P                      This patient presents to the ED for concern of constipation and abdominal pain. He is nontoxic, resting comfortably, vitals without significant abnormality. Abdominal exam notable for LLQ tenderness to palpation, no peritoneal signs. DDx: Constipation, perf, obstruction, colitis, diverticulitis, mass, intra-abdominal abscess, nephrolithiasis, pyelo, hernia.   Additional history obtained:  Additional history obtained from significant other at bedside, confirms above history. Prior records and nursing notes reviewed.   Lab Tests:  I Ordered, reviewed, and interpreted labs, which included: CBC: No significant anemia/leukocytosis.  CMP: Mildly low bicarb otherwise unremarkable.  Lipase: WNL UA: No UTI or hematuria.   Imaging Studies ordered:  I ordered imaging studies which included CT A/P, I independently visualized and interpreted imaging, agree with radiologist impression: 1. No acute or inflammatory process identified in the abdomen or pelvis. Normal appendix. 2. Sequelae of Calcific Pericarditis.  No pericardial effusion. 3. Advanced right hip joint degeneration. Lumbar spine  degeneration. 4. Aortic Atherosclerosis   Medicines ordered:  I ordered fluids to hydrate to help with constipation.    16:45: RE-EVAL: After the interventions stated above, I reevaluated the patient and found patient to be resting comfortably, no pain at present, no peritoneal signs on repeat abdominal exam- do not suspect acute surgical process. He does have stool in his colon noted on CT imaging. Unclear definitive etiology but sxs likely secondary to constipation. Discussed findings and plan of care with supervising physician Dr. Billy Fischer- in agreement with care & plan for discharge home with miralax and sennacolace. I discussed results, treatment plan, need for follow-up, and return precautions with the  patient. Provided opportunity for questions, patient confirmed understanding and is in agreement with plan.   Portions of this note were generated with [employer]. Dictation errors may occur despite best attempts at proofreading.  Final Clinical Impression(s) / ED Diagnoses Final diagnoses:  Constipation, unspecified constipation type    Rx / DC Orders ED Discharge Orders         Ordered    polyethylene glycol (MIRALAX) 17 g packet  Daily PRN     04/19/20 1652    senna-docusate (SENOKOT-S) 8.6-50 MG tablet  Daily PRN     04/19/20 1652           Domitila Stetler, Glynda Jaeger, PA-C 04/19/20 1655    Gareth Morgan, MD 04/19/20 2314

## 2020-04-19 NOTE — ED Notes (Signed)
ED Provider at bedside. 

## 2020-04-22 ENCOUNTER — Other Ambulatory Visit: Payer: Self-pay

## 2020-04-22 ENCOUNTER — Encounter: Payer: Self-pay | Admitting: Family Medicine

## 2020-04-22 ENCOUNTER — Ambulatory Visit (INDEPENDENT_AMBULATORY_CARE_PROVIDER_SITE_OTHER): Payer: Medicare Other | Admitting: Family Medicine

## 2020-04-22 VITALS — BP 124/76 | HR 74 | Temp 98.1°F | Wt 199.1 lb

## 2020-04-22 DIAGNOSIS — M545 Low back pain, unspecified: Secondary | ICD-10-CM

## 2020-04-22 DIAGNOSIS — R413 Other amnesia: Secondary | ICD-10-CM | POA: Diagnosis not present

## 2020-04-22 DIAGNOSIS — R4189 Other symptoms and signs involving cognitive functions and awareness: Secondary | ICD-10-CM | POA: Diagnosis not present

## 2020-04-22 DIAGNOSIS — K5900 Constipation, unspecified: Secondary | ICD-10-CM | POA: Diagnosis not present

## 2020-04-22 LAB — VITAMIN B12: Vitamin B-12: >1500 pg/mL — ABNORMAL HIGH (ref 211–911)

## 2020-04-22 NOTE — Patient Instructions (Signed)
Mild Neurocognitive Disorder Mild neurocognitive disorder, formerly known as mild cognitive impairment, is a disorder in which memory does not work as well as it should. This disorder may also cause problems with other mental functions, including thought, communication, behavior, and completion of tasks. These problems can be noticed and measured, but they usually do not interfere with daily activities or the ability to live independently. Mild neurocognitive disorder typically develops after 77 years of age, but it can also develop at younger ages. It is not as serious as major neurocognitive disorder, formerly known as dementia, but it may be the first sign of it. Generally, symptoms of this condition get worse over time. In rare cases, symptoms can get better. What are the causes? This condition may be caused by:  Brain disorders like Alzheimer's disease, Parkinson's disease, and other conditions that gradually damage nerve cells (neurodegenerative conditions).  Diseases that affect blood vessels in the brain and result in small strokes.  Certain infections, such as HIV.  Traumatic brain injury.  Other medical conditions, such as brain tumors, underactive thyroid (hypothyroidism), and vitamin B12 deficiency.  Use of certain drugs or prescription medicines. What increases the risk? The following factors may make you more likely to develop this condition:  Being older than 79.  Being male.  Low education level.  Diabetes, high blood pressure, high cholesterol, and other conditions that increase the risk for blood vessel diseases.  Untreated or undertreated sleep apnea.  Having a certain type of gene that can be passed from parent to child (inherited).  Chronic health problems such as heart disease, lung disease, liver disease, kidney disease, or depression. What are the signs or symptoms? Symptoms of this condition include:  Difficulty remembering. You may: ? Forget names, phone  numbers, or details of recent events. ? Forget social events and appointments. ? Repeatedly forget where you put your car keys or other items.  Difficulty thinking and solving problems. You may have trouble with complex tasks, such as: ? Paying bills. ? Driving in unfamiliar places.  Difficulty communicating. You may have trouble: ? Finding the right word or naming an object. ? Forming a sentence that makes sense, or understanding what you read or hear.  Changes in your behavior or personality. When this happens, you may: ? Lose interest in the things that you used to enjoy. ? Withdraw from social situations. ? Get angry more easily than usual. ? Act before thinking. How is this diagnosed? This condition is diagnosed based on:  Your symptoms. Your health care provider may ask you and the people you spend time with, such as family and friends, about your symptoms.  Evaluation of mental functions (neuropsychological testing). Your health care provider may refer you to a neurologist or mental health specialist to evaluate your mental functions in detail. To identify the cause of your condition, your health care provider may:  Get a detailed medical history.  Ask about use of alcohol, drugs, and prescription medicines.  Do a physical exam.  Order blood tests and brain imaging exams. How is this treated? Mild neurocognitive disorder that is caused by medicine use, drug use, infection, or another medical condition may improve when the cause is treated, or when medicines or drugs are stopped. If this disorder has another cause, it generally does not improve and may get worse. In these cases, the goal of treatment is to help you manage the loss of mental function. Treatments in these cases include:  Medicine. Medicine mainly helps memory and  behavior symptoms.  Talk therapy. Talk therapy provides education, emotional support, memory aids, and other ways of making up for problems with  mental function.  Lifestyle changes, including: ? Getting regular exercise. ? Eating a healthy diet that includes omega-3 fatty acids. ? Challenging your thinking and memory skills. ? Having more social interaction. Follow these instructions at home: Eating and drinking   Drink enough fluid to keep your urine pale yellow.  Eat a healthy diet that includes omega-3 fatty acids. These can be found in: ? Fish. ? Nuts. ? Leafy vegetables. ? Vegetable oils.  If you drink alcohol: ? Limit how much you use to:  0-1 drink a day for women.  0-2 drinks a day for men. ? Be aware of how much alcohol is in your drink. In the [place] one drink equals one 12 oz bottle of beer (355 mL), one 5 oz glass of wine (148 mL), or one 1 oz glass of hard liquor (44 mL). Lifestyle   Get regular exercise as told by your health care provider.  Do not use any products that contain nicotine or tobacco, such as cigarettes, e-cigarettes, and chewing tobacco. If you need help quitting, ask your health care provider.  Practice ways to manage stress. If you need help managing stress, ask your health care provider.  Continue to have social interaction.  Keep your mind active with stimulating activities you enjoy, such as reading or playing games.  Make sure to get quality sleep. Follow these tips: ? Avoid napping during the day. ? Keep your sleeping area dark and cool. ? Avoid exercising during the few hours before you go to bed. ? Avoid caffeine products in the evening. General instructions  Take over-the-counter and prescription medicines only as told by your health care provider. Your health care provider may recommend that you avoid taking medicines that can affect thinking, such as pain medicines or sleep medicines.  Work with your health care provider to find out what you need help with and what your safety needs are.  Keep all follow-up visits as told by your health care provider. This is  important. Where to find more information  [employer] on Aging: http://kim-miller.com/ Contact a health care provider if:  You have any new symptoms. Get help right away if:  You develop new confusion or your confusion gets worse.  You act in ways that place you or your family in danger. Summary  Mild neurocognitive disorder is a disorder in which memory does not work as well as it should.  Mild neurocognitive disorder can have many causes. It may be the first stage of dementia.  To manage your condition, get regular exercise, keep your mind active, get quality sleep, and eat a healthy diet. This information is not intended to replace advice given to you by your health care provider. Make sure you discuss any questions you have with your health care provider. Document Revised: 07/15/2019 Document Reviewed: 07/15/2019 Elsevier Patient Education  [place].  We will check the B12 and if normal, consider trial of low dose Aricept.

## 2020-04-22 NOTE — Progress Notes (Signed)
Subjective:     Patient ID: Jason [name], male   DOB: [birth year], 79 y.o.   MRN: [id number]  HPI Khai is seen for ER follow-up.  He is accompanied by his wife.  He had presented to the ER on 25 April with some left lower quadrant abdominal pain and constipation.  He had work-up including lipase, CBC, chemistries, urinalysis and these were all basically unremarkable.  CT scan showed no acute findings.  He had some stool in the colon.  There was no evidence for impaction.  He was given regimen of MiraLAX and Senokot-S and after couple doses did have good bowel movement has had several since then.  His abdominal pain has improved  He is having some left lower lumbar pain at this time.  Denies recent injury.  Pain is somewhat intermittent.  Worse with movement.  No radiculitis symptoms.  Has meloxicam from prior prescription but has not taken any.  Back pain is mild to moderate.  No recent fevers or chills.  Wife had called earlier with some concerns for cognitive impairment.  He has had some short-term memory issues per wife.  He had a sister that was diagnosed with Alzheimer's age 68 and died at 54.  His mother reportedly had dementia as well.  He had thyroid screen last fall which was normal.  No recent B12 level.  He had CT head last April and those results were reviewed.  He had some age-related changes but no acute findings.  Denies recent head injury.  Past Medical History:  Diagnosis Date  . Anxiety   . Atrial fibrillation ([place])    past hx  . Colon polyp   . Depression   . GERD (gastroesophageal reflux disease)   . Gout, unspecified   . Hypertension   . Osteoarthrosis, unspecified whether generalized or localized, unspecified site   . Pneumonia, organism unspecified(486)   . Pure hypercholesterolemia   . Routine general medical examination at a health care facility   . Sleep apnea    not using cpap currently  . Unspecified nonpsychotic mental disorder    Past Surgical History:   Procedure Laterality Date  . COLONOSCOPY  10/04/2006   jacobs  . neg hx    . UPPER GASTROINTESTINAL ENDOSCOPY  2015  . WISDOM TOOTH EXTRACTION      reports that he has never smoked. He has never used smokeless tobacco. He reports that he does not drink alcohol or use drugs. family history includes Arthritis in an other family member; Dementia in his mother; Heart failure in his father; Pneumonia in his father. No Known Allergies   Review of Systems  Constitutional: Negative for fatigue.  Eyes: Negative for visual disturbance.  Respiratory: Negative for cough, chest tightness and shortness of breath.   Cardiovascular: Negative for chest pain, palpitations and leg swelling.  Gastrointestinal: Negative for abdominal pain.       See HPI  Neurological: Negative for dizziness, syncope, weakness, light-headedness and headaches.       Objective:   Physical Exam Constitutional:      Appearance: He is well-developed.  HENT:     Right Ear: External ear normal.     Left Ear: External ear normal.  Eyes:     Pupils: Pupils are equal, round, and reactive to light.  Neck:     Thyroid: No thyromegaly.  Cardiovascular:     Rate and Rhythm: Normal rate and regular rhythm.  Pulmonary:     Effort: Pulmonary effort is normal.  No respiratory distress.     Breath sounds: Normal breath sounds. No wheezing or rales.  Musculoskeletal:     Cervical back: Neck supple.     Comments: Straight leg raise negative  Neurological:     Mental Status: He is alert and oriented to person, place, and time.     Cranial Nerves: No cranial nerve deficit.     Comments: Full strength lower extremity.  He has no significant reflex knee or ankle and this is apparently chronic.  No sensory impairment to touch  Psychiatric:     Comments: Was able to get 3 out of 3 word recall but had some difficulty initially.  Oriented to day of week but not year, date, or month.  Did well with serial subtractions         Assessment:     #1 recent constipation improved with MiraLAX and Senokot-S  #2 left lower lumbar back pain.  Suspect musculoskeletal.  Nonfocal neuro exam.  There was mention of degenerative changes right hip from recent CT scan but his symptoms are left-sided.    #3 history of Covid infection back in November.  His symptoms are relatively mild and has been vaccinated since then  #4 concern for cognitive impairment.  Strong family history of dementia.    Plan:     -Discussed measures to reduce constipation with plenty of fluids, fiber, and MiraLAX as needed  -Consider short-term use of meloxicam for his back pain and take with food  -Consider trial of physical therapy if back pain not improving next couple weeks  -Check B12 level.  If normal consider low-dose Aricept 5 mg daily and then titrate in 30 days to 10 mg if tolerated  [name] MD [facility]

## 2020-05-08 DIAGNOSIS — L84 Corns and callosities: Secondary | ICD-10-CM | POA: Diagnosis not present

## 2020-05-08 DIAGNOSIS — M79671 Pain in right foot: Secondary | ICD-10-CM | POA: Insufficient documentation

## 2020-05-08 DIAGNOSIS — M79672 Pain in left foot: Secondary | ICD-10-CM | POA: Diagnosis not present

## 2020-05-14 DIAGNOSIS — L84 Corns and callosities: Secondary | ICD-10-CM | POA: Diagnosis not present

## 2020-05-14 DIAGNOSIS — L89899 Pressure ulcer of other site, unspecified stage: Secondary | ICD-10-CM | POA: Diagnosis not present

## 2020-05-19 ENCOUNTER — Ambulatory Visit (INDEPENDENT_AMBULATORY_CARE_PROVIDER_SITE_OTHER): Payer: Medicare Other | Admitting: Psychiatry

## 2020-05-19 ENCOUNTER — Ambulatory Visit: Payer: Medicare Other | Admitting: Psychiatry

## 2020-05-19 ENCOUNTER — Encounter: Payer: Self-pay | Admitting: Psychiatry

## 2020-05-19 ENCOUNTER — Other Ambulatory Visit: Payer: Self-pay

## 2020-05-19 DIAGNOSIS — F422 Mixed obsessional thoughts and acts: Secondary | ICD-10-CM | POA: Diagnosis not present

## 2020-05-19 DIAGNOSIS — F411 Generalized anxiety disorder: Secondary | ICD-10-CM

## 2020-05-19 DIAGNOSIS — G3184 Mild cognitive impairment, so stated: Secondary | ICD-10-CM | POA: Diagnosis not present

## 2020-05-19 DIAGNOSIS — F3342 Major depressive disorder, recurrent, in full remission: Secondary | ICD-10-CM

## 2020-05-19 MED ORDER — SERTRALINE HCL 100 MG PO TABS: 200.0000 mg | ORAL_TABLET | Freq: Every day | ORAL | 0 refills | Status: DC

## 2020-05-19 MED ORDER — DONEPEZIL HCL 5 MG PO TABS: 5.0000 mg | ORAL_TABLET | Freq: Every day | ORAL | 0 refills | Status: DC

## 2020-05-19 NOTE — Progress Notes (Signed)
Jason [name] [id number] [birth year] 79 y.o.  Subjective:   Patient ID:  Jason [name] is a 79 y.o. (DOB [birth year]) male.  Chief Complaint:  Chief Complaint  Patient presents with  . Follow-up  . Depression  . Anxiety    Depression        Associated symptoms include no decreased concentration and no suicidal ideas.   Seen with wife. Jason [name] presents to the office today for follow-up of MDE, OCD and GAD.  Last seen August, 2020.  No meds were changed.  5/5//21  Seen with wife [name]. Had mild Covid in fall 2020 and got the vaccine.   CO forgetfulness with names and places but not getting words out correctly.   Wife notices the same but she's not overly concerned but she has noticed occ gets confused about people. PCP checked b12 normal bc pt brought up memory concern.   Depression can occur but only last a day.  Depression and anxiety under control.  Except notices Feb each year a change in disposition but it's minor.  A little anxious without reason.  Patient reports stable mood and denies depressed or irritable moods.   Patient denies difficulty with sleep initiation or maintenance but it is split with some napping.  EMA. Denies appetite disturbance.  Patient reports that energy and motivation have been good.  Patient denies any difficulty with concentration.  Patient denies any suicidal ideation.  Stress caring for inlaws in their 90s.  F good cognition.  M cognitive problems.  Past Psychiatric Medication Trials: Wellbutrin side effects, Lexapro nausea, Abilify, paroxetine, fluoxetine briefly, do duloxetine, ECT, Effexor with a history of benefit, mirtazapine, Pristiq 100, buspirone side effects, sertraline 200, lithium 600, olanzapine 2.5 with benefit.  Review of Systems:  Review of Systems  Cardiovascular: Negative for palpitations.  Neurological: Negative for tremors and weakness.  Psychiatric/Behavioral: Positive for depression. Negative for agitation, behavioral problems,  confusion, decreased concentration, dysphoric mood, hallucinations, self-injury, sleep disturbance and suicidal ideas. The patient is nervous/anxious. The patient is not hyperactive.     Medications: I have reviewed the patient's current medications.  Current Outpatient Medications  Medication Sig Dispense Refill  . amLODipine (NORVASC) 5 MG tablet TAKE 1 TABLET BY MOUTH EVERY DAY 90 tablet 1  . lithium carbonate 300 MG capsule TAKE 2 CAPSULES BY MOUTH EVERY DAY WITH DINNER. 180 capsule 1  . meloxicam (MOBIC) 15 MG tablet Take 15 mg by mouth as needed for pain.    . Methylfol-Algae-B12-Acetylcyst (CEREFOLIN NAC) 6-90.314-2-600 MG TABS TAKE 1 TABLET BY MOUTH EVERY DAY 90 tablet 3  . MYRBETRIQ 25 MG TB24 tablet TAKE 1 TABLET BY MOUTH EVERY DAY 90 tablet 3  . polyethylene glycol (MIRALAX) 17 g packet Take 17 g by mouth daily as needed for moderate constipation. 14 each 0  . senna-docusate (SENOKOT-S) 8.6-50 MG tablet Take 2 tablets by mouth daily as needed for mild constipation. 30 tablet 0  . sertraline (ZOLOFT) 100 MG tablet Take 2 tablets (200 mg total) by mouth daily. 180 tablet 0  . simvastatin (ZOCOR) 20 MG tablet TAKE 1 TABLET DAILY 90 tablet 1  . Vitamin D, Ergocalciferol, (DRISDOL) 1.25 MG (50000 UNIT) CAPS capsule TAKE 1 CAPSULE EVERY 7 DAYS 12 capsule 1  . donepezil (ARICEPT) 5 MG tablet Take 1 tablet (5 mg total) by mouth at bedtime. 30 tablet 0   No current facility-administered medications for this visit.    Medication Side Effects: None except occ jerks from lithium  Allergies: No Known Allergies  Past Medical History:  Diagnosis Date  . Anxiety   . Atrial fibrillation ([place])    past hx  . Colon polyp   . Depression   . GERD (gastroesophageal reflux disease)   . Gout, unspecified   . Hypertension   . Osteoarthrosis, unspecified whether generalized or localized, unspecified site   . Pneumonia, organism unspecified(486)   . Pure hypercholesterolemia   . Routine general  medical examination at a health care facility   . Sleep apnea    not using cpap currently  . Unspecified nonpsychotic mental disorder     Family History  Problem Relation Age of Onset  . Dementia Mother   . Pneumonia Father   . Heart failure Father   . Arthritis Other   . Colon cancer Neg Hx   . Colon polyps Neg Hx   . Esophageal cancer Neg Hx   . Stomach cancer Neg Hx   . Rectal cancer Neg Hx     Social History   Socioeconomic History  . Marital status: Married    Spouse name: [name]  . Number of children: 0  . Years of education: Not on file  . Highest education level: Not on file  Occupational History  . Occupation: retired    Fish farm manager: [employer]  Tobacco Use  . Smoking status: Never Smoker  . Smokeless tobacco: Never Used  Substance and Sexual Activity  . Alcohol use: No  . Drug use: No  . Sexual activity: Not on file  Other Topics Concern  . Not on file  Social History Narrative  . Not on file   Social Determinants of Health   Financial Resource Strain:   . Difficulty of Paying Living Expenses:   Food Insecurity:   . Worried About Charity fundraiser in the Last Year:   . Arboriculturist in the Last Year:   Transportation Needs:   . Film/video editor (Medical):   Marland Kitchen Lack of Transportation (Non-Medical):   Physical Activity:   . Days of Exercise per Week:   . Minutes of Exercise per Session:   Stress:   . Feeling of Stress :   Social Connections:   . Frequency of Communication with Friends and Family:   . Frequency of Social Gatherings with Friends and Family:   . Attends Religious Services:   . Active Member of Clubs or Organizations:   . Attends Archivist Meetings:   Marland Kitchen Marital Status:   Intimate Partner Violence:   . Fear of Current or Ex-Partner:   . Emotionally Abused:   Marland Kitchen Physically Abused:   . Sexually Abused:     Past Medical History, Surgical history, Social history, and Family history were reviewed and updated as  appropriate.   Please see review of systems for further details on the patient's review from today.   Objective:   Physical Exam:  There were no vitals taken for this visit.  Physical Exam Constitutional:      General: He is not in acute distress.    Appearance: He is well-developed.  Musculoskeletal:        General: No deformity.  Neurological:     Mental Status: He is alert and oriented to person, place, and time.     Coordination: Coordination normal.  Psychiatric:        Attention and Perception: Attention normal. He is attentive. He does not perceive auditory hallucinations.        Mood  and Affect: Mood normal. Mood is not anxious or depressed. Affect is not labile, blunt, angry or inappropriate.        Speech: Speech normal. Speech is not slurred.        Behavior: Behavior normal.        Thought Content: Thought content normal. Thought content does not include homicidal or suicidal ideation.        Cognition and Memory: Cognition normal.        Judgment: Judgment normal.     Comments: Insight is fair to good.     Lab Review:     Component Value Date/Time   NA 138 04/19/2020 1247   K 4.3 04/19/2020 1247   CL 109 04/19/2020 1247   CO2 20 (L) 04/19/2020 1247   GLUCOSE 92 04/19/2020 1247   BUN 16 04/19/2020 1247   CREATININE 1.15 04/19/2020 1247   CALCIUM 9.7 04/19/2020 1247   PROT 7.1 04/19/2020 1247   ALBUMIN 4.4 04/19/2020 1247   AST 23 04/19/2020 1247   ALT 19 04/19/2020 1247   ALKPHOS 57 04/19/2020 1247   BILITOT 0.8 04/19/2020 1247   GFRNONAA >60 04/19/2020 1247   GFRAA >60 04/19/2020 1247       Component Value Date/Time   WBC 6.7 04/19/2020 1247   RBC 4.09 (L) 04/19/2020 1247   HGB 13.2 04/19/2020 1247   HCT 39.6 04/19/2020 1247   PLT 199 04/19/2020 1247   MCV 96.8 04/19/2020 1247   MCH 32.3 04/19/2020 1247   MCHC 33.3 04/19/2020 1247   RDW 12.6 04/19/2020 1247   LYMPHSABS 1.8 01/13/2014 1134   MONOABS 0.4 01/13/2014 1134   EOSABS 0.3  01/13/2014 1134   BASOSABS 0.0 01/13/2014 1134   Normal TSH in September.  Last lithium 0.8 in July 2019  Lithium level January 10, 2027 0.5  Lithium Lvl  Date Value Ref Range Status  01/10/2019 0.5 (L) 0.6 - 1.2 mmol/L Final     No results found for: PHENYTOIN, PHENOBARB, VALPROATE, CBMZ   .res Assessment: Plan:    Recurrent major depression in complete remission ([place]) - Plan: sertraline (ZOLOFT) 100 MG tablet  Mixed obsessional thoughts and acts - Plan: sertraline (ZOLOFT) 100 MG tablet  Generalized anxiety disorder - Plan: sertraline (ZOLOFT) 100 MG tablet  Mild cognitive impairment - Plan: donepezil (ARICEPT) 5 MG tablet   Mr. Lapietra has a long history of the above diagnoses which have been treatment resistant at times and multiple relapses.  He ultimately responded to the combination of sertraline and lithium and Zyprexa.  He has been able to wean off the Zyprexa without complication.  He's continuing to do well.  As noted multiple failed meds and a history of treatment resistance. Each diagnosis under control and meds helping.   Option donepezil for forgetfulness.  Does not have dementia.    Supportive therapy dealing with sick in-laws and caretaking.  Encourage self-care and recreation.  Counseled patient regarding potential benefits, risks, and side effects of lithium to include potential risk of lithium affecting thyroid and renal function.  Discussed need for periodic lab monitoring to determine drug level and to assess for potential adverse effects.  Counseled patient regarding signs and symptoms of lithium toxicity and advised that they notify office immediately or seek urgent medical attention if experiencing these signs and symptoms.  Patient advised to contact office with any questions or concerns.  Call if any changes in BP meds.  Disc DDI  Lithium level is in the low normal range  and is typical for the levels he has had over the last couple of years.  It has been  effective at this low dosage so we will make no med changes.  Disc the difference between Cerefolin NAC and Metafolbic plus.  Disc donepezil 5 mg nightly.  Does not have Alzheimer's but is bothered enough with forgetfulness to warrant the benefit.  Answered questions about OTC memory meds.  High relapse risk if reduce meds.  But rarely high dose sertraline can interfere with cognition but he's at risk with reduction.  This appt was 30 mins.  FU 6 mos  Lynder Parents, MD, DFAPA     Please see After Visit Summary for patient specific instructions.  No future appointments.  No orders of the defined types were placed in this encounter.     -------------------------------

## 2020-05-21 ENCOUNTER — Other Ambulatory Visit: Payer: Self-pay

## 2020-05-21 ENCOUNTER — Telehealth: Payer: Self-pay | Admitting: Psychiatry

## 2020-05-21 DIAGNOSIS — G3184 Mild cognitive impairment, so stated: Secondary | ICD-10-CM

## 2020-05-21 MED ORDER — DONEPEZIL HCL 5 MG PO TABS: 5.0000 mg | ORAL_TABLET | Freq: Every day | ORAL | 0 refills | Status: DC

## 2020-05-21 NOTE — Telephone Encounter (Signed)
Tyge saw Dr. Clovis Pu 5/25 and he sent in a prescription for his Aricept but the pharmacy never got it.  Please resend to [facility] [place].

## 2020-05-21 NOTE — Telephone Encounter (Signed)
RX sent, for some reason the last one did not transmit.

## 2020-05-28 DIAGNOSIS — L84 Corns and callosities: Secondary | ICD-10-CM | POA: Diagnosis not present

## 2020-05-28 DIAGNOSIS — L89899 Pressure ulcer of other site, unspecified stage: Secondary | ICD-10-CM | POA: Diagnosis not present

## 2020-06-12 ENCOUNTER — Other Ambulatory Visit: Payer: Self-pay | Admitting: Psychiatry

## 2020-06-12 DIAGNOSIS — G3184 Mild cognitive impairment, so stated: Secondary | ICD-10-CM

## 2020-06-12 NOTE — Telephone Encounter (Signed)
Last Rx says will increase next month

## 2020-07-19 ENCOUNTER — Other Ambulatory Visit: Payer: Self-pay | Admitting: Family Medicine

## 2020-07-26 ENCOUNTER — Other Ambulatory Visit: Payer: Self-pay | Admitting: Psychiatry

## 2020-08-16 DIAGNOSIS — Z20828 Contact with and (suspected) exposure to other viral communicable diseases: Secondary | ICD-10-CM | POA: Diagnosis not present

## 2020-09-10 DIAGNOSIS — D23112 Other benign neoplasm of skin of right lower eyelid, including canthus: Secondary | ICD-10-CM | POA: Diagnosis not present

## 2020-09-10 DIAGNOSIS — H2513 Age-related nuclear cataract, bilateral: Secondary | ICD-10-CM | POA: Diagnosis not present

## 2020-09-10 DIAGNOSIS — H5203 Hypermetropia, bilateral: Secondary | ICD-10-CM | POA: Diagnosis not present

## 2020-09-10 DIAGNOSIS — H04123 Dry eye syndrome of bilateral lacrimal glands: Secondary | ICD-10-CM | POA: Diagnosis not present

## 2020-10-05 DIAGNOSIS — H2513 Age-related nuclear cataract, bilateral: Secondary | ICD-10-CM | POA: Diagnosis not present

## 2020-10-05 DIAGNOSIS — D23112 Other benign neoplasm of skin of right lower eyelid, including canthus: Secondary | ICD-10-CM | POA: Diagnosis not present

## 2020-10-16 ENCOUNTER — Other Ambulatory Visit: Payer: Self-pay

## 2020-10-16 ENCOUNTER — Encounter: Payer: Self-pay | Admitting: Family Medicine

## 2020-10-16 ENCOUNTER — Ambulatory Visit (INDEPENDENT_AMBULATORY_CARE_PROVIDER_SITE_OTHER): Payer: Medicare Other

## 2020-10-16 DIAGNOSIS — Z23 Encounter for immunization: Secondary | ICD-10-CM

## 2020-10-19 ENCOUNTER — Encounter: Payer: Self-pay | Admitting: Gastroenterology

## 2020-10-30 DIAGNOSIS — H2513 Age-related nuclear cataract, bilateral: Secondary | ICD-10-CM | POA: Diagnosis not present

## 2020-10-30 DIAGNOSIS — D23112 Other benign neoplasm of skin of right lower eyelid, including canthus: Secondary | ICD-10-CM | POA: Diagnosis not present

## 2020-11-03 ENCOUNTER — Other Ambulatory Visit: Payer: Self-pay | Admitting: Family Medicine

## 2020-11-03 DIAGNOSIS — Z23 Encounter for immunization: Secondary | ICD-10-CM | POA: Diagnosis not present

## 2020-11-07 ENCOUNTER — Ambulatory Visit: Payer: Medicare Other

## 2020-11-16 ENCOUNTER — Other Ambulatory Visit: Payer: Self-pay

## 2020-11-16 ENCOUNTER — Encounter: Payer: Self-pay | Admitting: Psychiatry

## 2020-11-16 ENCOUNTER — Ambulatory Visit (INDEPENDENT_AMBULATORY_CARE_PROVIDER_SITE_OTHER): Payer: Medicare Other | Admitting: Psychiatry

## 2020-11-16 DIAGNOSIS — G3184 Mild cognitive impairment, so stated: Secondary | ICD-10-CM | POA: Diagnosis not present

## 2020-11-16 DIAGNOSIS — F422 Mixed obsessional thoughts and acts: Secondary | ICD-10-CM | POA: Diagnosis not present

## 2020-11-16 DIAGNOSIS — F411 Generalized anxiety disorder: Secondary | ICD-10-CM | POA: Diagnosis not present

## 2020-11-16 DIAGNOSIS — F3342 Major depressive disorder, recurrent, in full remission: Secondary | ICD-10-CM | POA: Diagnosis not present

## 2020-11-16 MED ORDER — SERTRALINE HCL 100 MG PO TABS: 200.0000 mg | ORAL_TABLET | Freq: Every day | ORAL | 1 refills | Status: DC

## 2020-11-16 MED ORDER — LITHIUM CARBONATE 300 MG PO CAPS: 600.0000 mg | ORAL_CAPSULE | Freq: Every evening | ORAL | 1 refills | Status: DC

## 2020-11-16 NOTE — Progress Notes (Signed)
Jason [name] [id number] 10-22-1941 79 y.o.  Subjective:   Patient ID:  Jason [name] is a 79 y.o. (DOB 1941-05-05) male.  Chief Complaint:  Chief Complaint  Patient presents with  . Follow-up    mood and anxiety    Depression        Associated symptoms include no decreased concentration and no suicidal ideas.   Seen with wife. Jason [name] presents to the office today for follow-up of MDE, OCD and GAD.  Last seen August, 2020.  No meds were changed.  5/5//21  Seen with wife Jason [name]. Had mild Covid in fall 2020 and got the vaccine.   CO forgetfulness with names and places but not getting words out correctly.   Wife notices the same but she's not overly concerned but she has noticed occ gets confused about people. PCP checked b12 normal bc pt brought up memory concern.   11/16/20 appt with following noted:  Seen with wife Jason [name] Still doing well. Occ spellls of anxiety randomly.  Does better if doesn't forget meds. No SE.  Memory trouble limited mainly to names.  Not much worse.  OK otherwise.  Depression can occur but only last a day.  Depression and anxiety under control.  Except notices Feb each year a change in disposition but it's minor.  Patient reports stable mood and denies depressed or irritable moods.   Patient denies difficulty with sleep initiation or maintenance but it is split with some napping.  EMA. Denies appetite disturbance.  Patient reports that energy and motivation have been good.  Patient denies any difficulty with concentration.  Patient denies any suicidal ideation.  Stress caring for inlaws in their 90s.  F good cognition.  M cognitive problems.  Past Psychiatric Medication Trials: Wellbutrin side effects, Lexapro nausea, Abilify, paroxetine, fluoxetine briefly, do duloxetine, ECT, Effexor with a history of benefit, mirtazapine, Pristiq 100, buspirone side effects, sertraline 200, lithium 600, olanzapine 2.5 with benefit.  Review of Systems:  Review of Systems   Cardiovascular: Negative for chest pain and palpitations.  Musculoskeletal: Positive for back pain.  Neurological: Negative for tremors and weakness.  Psychiatric/Behavioral: Positive for depression. Negative for agitation, behavioral problems, confusion, decreased concentration, dysphoric mood, hallucinations, self-injury, sleep disturbance and suicidal ideas. The patient is nervous/anxious. The patient is not hyperactive.     Medications: I have reviewed the patient's current medications.  Current Outpatient Medications  Medication Sig Dispense Refill  . amLODipine (NORVASC) 5 MG tablet TAKE 1 TABLET BY MOUTH EVERY DAY 90 tablet 1  . donepezil (ARICEPT) 10 MG tablet Take 1 tablet (10 mg total) by mouth at bedtime. 90 tablet 3  . lithium carbonate 300 MG capsule TAKE 2 CAPSULES BY MOUTH EVERY DAY WITH DINNER. 180 capsule 1  . meloxicam (MOBIC) 15 MG tablet Take 15 mg by mouth as needed for pain.    . Methylfol-Algae-B12-Acetylcyst (CEREFOLIN NAC) 6-90.314-2-600 MG TABS TAKE 1 TABLET BY MOUTH EVERY DAY 90 tablet 3  . MYRBETRIQ 25 MG TB24 tablet TAKE 1 TABLET BY MOUTH EVERY DAY 90 tablet 3  . polyethylene glycol (MIRALAX) 17 g packet Take 17 g by mouth daily as needed for moderate constipation. 14 each 0  . senna-docusate (SENOKOT-S) 8.6-50 MG tablet Take 2 tablets by mouth daily as needed for mild constipation. 30 tablet 0  . sertraline (ZOLOFT) 100 MG tablet Take 2 tablets (200 mg total) by mouth daily. 180 tablet 0  . simvastatin (ZOCOR) 20 MG tablet TAKE 1 TABLET DAILY 90  tablet 1  . Vitamin D, Ergocalciferol, (DRISDOL) 1.25 MG (50000 UNIT) CAPS capsule TAKE 1 CAPSULE EVERY 7 DAYS 12 capsule 1   No current facility-administered medications for this visit.    Medication Side Effects: None except occ jerks from lithium  Allergies: No Known Allergies  Past Medical History:  Diagnosis Date  . Anxiety   . Atrial fibrillation ([place])    past hx  . Colon polyp   . Depression   . GERD  (gastroesophageal reflux disease)   . Gout, unspecified   . Hypertension   . Osteoarthrosis, unspecified whether generalized or localized, unspecified site   . Pneumonia, organism unspecified(486)   . Pure hypercholesterolemia   . Routine general medical examination at a health care facility   . Sleep apnea    not using cpap currently  . Unspecified nonpsychotic mental disorder     Family History  Problem Relation Age of Onset  . Dementia Mother   . Pneumonia Father   . Heart failure Father   . Arthritis Other   . Colon cancer Neg Hx   . Colon polyps Neg Hx   . Esophageal cancer Neg Hx   . Stomach cancer Neg Hx   . Rectal cancer Neg Hx     Social History   Socioeconomic History  . Marital status: Married    Spouse name: Jason [name]  . Number of children: 0  . Years of education: Not on file  . Highest education level: Not on file  Occupational History  . Occupation: retired    Fish farm manager: [employer]  Tobacco Use  . Smoking status: Never Smoker  . Smokeless tobacco: Never Used  Vaping Use  . Vaping Use: Never used  Substance and Sexual Activity  . Alcohol use: No  . Drug use: No  . Sexual activity: Not on file  Other Topics Concern  . Not on file  Social History Narrative  . Not on file   Social Determinants of Health   Financial Resource Strain:   . Difficulty of Paying Living Expenses: Not on file  Food Insecurity:   . Worried About Charity fundraiser in the Last Year: Not on file  . Ran Out of Food in the Last Year: Not on file  Transportation Needs:   . Lack of Transportation (Medical): Not on file  . Lack of Transportation (Non-Medical): Not on file  Physical Activity:   . Days of Exercise per Week: Not on file  . Minutes of Exercise per Session: Not on file  Stress:   . Feeling of Stress : Not on file  Social Connections:   . Frequency of Communication with Friends and Family: Not on file  . Frequency of Social Gatherings with Friends and Family: Not on  file  . Attends Religious Services: Not on file  . Active Member of Clubs or Organizations: Not on file  . Attends Archivist Meetings: Not on file  . Marital Status: Not on file  Intimate Partner Violence:   . Fear of Current or Ex-Partner: Not on file  . Emotionally Abused: Not on file  . Physically Abused: Not on file  . Sexually Abused: Not on file    Past Medical History, Surgical history, Social history, and Family history were reviewed and updated as appropriate.   Please see review of systems for further details on the patient's review from today.   Objective:   Physical Exam:  There were no vitals taken for this visit.  Physical Exam Constitutional:      General: He is not in acute distress.    Appearance: He is well-developed.  Musculoskeletal:        General: No deformity.  Neurological:     Mental Status: He is alert and oriented to person, place, and time.     Coordination: Coordination normal.  Psychiatric:        Attention and Perception: Attention normal. He is attentive. He does not perceive auditory hallucinations.        Mood and Affect: Mood normal. Mood is not anxious or depressed. Affect is not labile, blunt, angry or inappropriate.        Speech: Speech normal. Speech is not slurred.        Behavior: Behavior normal. Behavior is not slowed.        Thought Content: Thought content normal. Thought content does not include homicidal or suicidal ideation.        Cognition and Memory: Cognition normal.        Judgment: Judgment normal.     Comments: Insight is fair to good.     Lab Review:     Component Value Date/Time   NA 138 04/19/2020 1247   K 4.3 04/19/2020 1247   CL 109 04/19/2020 1247   CO2 20 (L) 04/19/2020 1247   GLUCOSE 92 04/19/2020 1247   BUN 16 04/19/2020 1247   CREATININE 1.15 04/19/2020 1247   CALCIUM 9.7 04/19/2020 1247   PROT 7.1 04/19/2020 1247   ALBUMIN 4.4 04/19/2020 1247   AST 23 04/19/2020 1247   ALT 19  04/19/2020 1247   ALKPHOS 57 04/19/2020 1247   BILITOT 0.8 04/19/2020 1247   GFRNONAA >60 04/19/2020 1247   GFRAA >60 04/19/2020 1247       Component Value Date/Time   WBC 6.7 04/19/2020 1247   RBC 4.09 (L) 04/19/2020 1247   HGB 13.2 04/19/2020 1247   HCT 39.6 04/19/2020 1247   PLT 199 04/19/2020 1247   MCV 96.8 04/19/2020 1247   MCH 32.3 04/19/2020 1247   MCHC 33.3 04/19/2020 1247   RDW 12.6 04/19/2020 1247   LYMPHSABS 1.8 01/13/2014 1134   MONOABS 0.4 01/13/2014 1134   EOSABS 0.3 01/13/2014 1134   BASOSABS 0.0 01/13/2014 1134   Normal TSH in September.  Last lithium 0.8 in July 2019  Lithium level January 10, 2027 0.5  Lithium Lvl  Date Value Ref Range Status  01/10/2019 0.5 (L) 0.6 - 1.2 mmol/L Final     No results found for: PHENYTOIN, PHENOBARB, VALPROATE, CBMZ   .res Assessment: Plan:    Recurrent major depression in complete remission ([place])  Mixed obsessional thoughts and acts  Generalized anxiety disorder  Mild cognitive impairment   Jason [name] has a long history of the above diagnoses which have been treatment resistant at times and multiple relapses.  He ultimately responded to the combination of sertraline and lithium and Zyprexa.  He has been able to wean off the Zyprexa without complication.  He's continuing to do well.  As noted multiple failed meds and a history of treatment resistance. Each diagnosis under control and meds helping.   Option donepezil for forgetfulness.  Does not have dementia.    Supportive therapy dealing with sick in-laws and caretaking.  Encourage self-care and recreation.  Counseled patient regarding potential benefits, risks, and side effects of lithium to include potential risk of lithium affecting thyroid and renal function.  Discussed need for periodic lab monitoring to determine drug level  and to assess for potential adverse effects.  Counseled patient regarding signs and symptoms of lithium toxicity and advised that they  notify office immediately or seek urgent medical attention if experiencing these signs and symptoms.  Patient advised to contact office with any questions or concerns.  Call if any changes in BP meds.  Disc DDI  Lithium level is in the low normal range and is typical for the levels he has had over the last couple of years.  It has been effective at this low dosage so we will make no med changes. Check lithium lithium.  Disc the difference between Cerefolin NAC and Metafolbic plus.  Disc donepezil 10 mg nightly.  Does not have Alzheimer's but is bothered enough with forgetfulness to warrant the benefit.  Answered questions about OTC memory meds.  High relapse risk if reduce meds.  But rarely high dose sertraline can interfere with cognition but he's at risk with reduction. No med changes.  This appt was 30 mins.  FU 6 mos  Lynder Parents, MD, DFAPA     Please see After Visit Summary for patient specific instructions.  Future Appointments  Date Time Provider [place]  12/08/2020 10:30 AM [facility] PRE[facility]  12/22/2020 10:30 AM Milus Banister, MD [facility]    No orders of the defined types were placed in this encounter.     -------------------------------

## 2020-11-27 DIAGNOSIS — F3342 Major depressive disorder, recurrent, in full remission: Secondary | ICD-10-CM | POA: Diagnosis not present

## 2020-11-28 LAB — LITHIUM LEVEL: Lithium Lvl: 0.9 mmol/L (ref 0.6–1.2)

## 2020-11-30 ENCOUNTER — Other Ambulatory Visit: Payer: Self-pay | Admitting: Psychiatry

## 2020-11-30 DIAGNOSIS — F3342 Major depressive disorder, recurrent, in full remission: Secondary | ICD-10-CM

## 2020-11-30 MED ORDER — LITHIUM CARBONATE 150 MG PO CAPS: 450.0000 mg | ORAL_CAPSULE | Freq: Every evening | ORAL | 1 refills | Status: DC

## 2020-11-30 NOTE — Progress Notes (Signed)
Patient's lithium level was 0.51-year ago on 600 mg daily.  Level on 11/28/2020 was 0.9 on 600 mg daily.  The lithium level has crept up a little bit.  I am concerned it may start causing side effects.  Therefore a prescription has been sent so that he can reduce the dose to 450 mg daily by using 3 of the 150 mg capsules.

## 2020-12-08 ENCOUNTER — Ambulatory Visit (AMBULATORY_SURGERY_CENTER): Payer: Self-pay | Admitting: *Deleted

## 2020-12-08 ENCOUNTER — Other Ambulatory Visit: Payer: Self-pay

## 2020-12-08 VITALS — Ht 74.0 in | Wt 194.0 lb

## 2020-12-08 DIAGNOSIS — Z86010 Personal history of colonic polyps: Secondary | ICD-10-CM

## 2020-12-08 MED ORDER — PEG 3350-KCL-NA BICARB-NACL 420 G PO SOLR: 4000.0000 mL | Freq: Once | ORAL | 0 refills | Status: AC

## 2020-12-08 NOTE — Progress Notes (Signed)

## 2020-12-22 ENCOUNTER — Encounter: Payer: Medicare Other | Admitting: Gastroenterology

## 2020-12-30 ENCOUNTER — Other Ambulatory Visit: Payer: Self-pay | Admitting: Family Medicine

## 2021-01-01 ENCOUNTER — Other Ambulatory Visit: Payer: Medicare Other

## 2021-01-01 DIAGNOSIS — Z20822 Contact with and (suspected) exposure to covid-19: Secondary | ICD-10-CM | POA: Diagnosis not present

## 2021-01-04 LAB — NOVEL CORONAVIRUS, NAA: SARS-CoV-2, NAA: NOT DETECTED

## 2021-01-09 ENCOUNTER — Other Ambulatory Visit: Payer: Self-pay | Admitting: Family Medicine

## 2021-01-11 NOTE — Telephone Encounter (Signed)
Last office visit-04/22/2020 Last refill--07/20/2020  No labs within one year

## 2021-01-12 ENCOUNTER — Telehealth: Payer: Self-pay | Admitting: Family Medicine

## 2021-01-12 ENCOUNTER — Other Ambulatory Visit: Payer: Self-pay | Admitting: Family Medicine

## 2021-01-12 NOTE — Telephone Encounter (Signed)
Pt is scheduled for a lab visit on 1.25.22/I do not see any future orders in the system/I do see mention of climbing Lithium level but no mention of recheck/plz review and advise/thx dmf

## 2021-01-12 NOTE — Telephone Encounter (Signed)
Refill once and set up office follow up so that we can get labs

## 2021-01-12 NOTE — Telephone Encounter (Signed)
He needs office follow-up as well.  Lets see if we can get him in for office visit same day and we can clarify what labs to be done at that time prior to his lab appointment.

## 2021-01-14 NOTE — Telephone Encounter (Signed)
Appointment made/thx dmf

## 2021-01-19 ENCOUNTER — Other Ambulatory Visit: Payer: Medicare Other

## 2021-01-25 ENCOUNTER — Other Ambulatory Visit: Payer: Self-pay

## 2021-01-26 ENCOUNTER — Ambulatory Visit (INDEPENDENT_AMBULATORY_CARE_PROVIDER_SITE_OTHER): Payer: Medicare Other | Admitting: Family Medicine

## 2021-01-26 DIAGNOSIS — I10 Essential (primary) hypertension: Secondary | ICD-10-CM

## 2021-01-26 DIAGNOSIS — Z79899 Other long term (current) drug therapy: Secondary | ICD-10-CM

## 2021-01-26 DIAGNOSIS — Z1159 Encounter for screening for other viral diseases: Secondary | ICD-10-CM

## 2021-01-26 DIAGNOSIS — E7800 Pure hypercholesterolemia, unspecified: Secondary | ICD-10-CM | POA: Diagnosis not present

## 2021-01-26 LAB — HEPATIC FUNCTION PANEL
ALT: 17 U/L (ref 0–53)
AST: 22 U/L (ref 0–37)
Albumin: 4.5 g/dL (ref 3.5–5.2)
Alkaline Phosphatase: 59 U/L (ref 39–117)
Bilirubin, Direct: 0.2 mg/dL (ref 0.0–0.3)
Total Bilirubin: 0.8 mg/dL (ref 0.2–1.2)
Total Protein: 6.6 g/dL (ref 6.0–8.3)

## 2021-01-26 LAB — LIPID PANEL
Cholesterol: 172 mg/dL (ref 0–200)
HDL: 73.3 mg/dL (ref >39.00)
LDL Cholesterol: 83 mg/dL (ref 0–99)
NonHDL: 99.01
Total CHOL/HDL Ratio: 2
Triglycerides: 82 mg/dL (ref 0.0–149.0)
VLDL: 16.4 mg/dL (ref 0.0–40.0)

## 2021-01-26 LAB — TSH: TSH: 3.35 u[IU]/mL (ref 0.35–4.50)

## 2021-01-26 LAB — BASIC METABOLIC PANEL
BUN: 18 mg/dL (ref 6–23)
CO2: 28 mEq/L (ref 19–32)
Calcium: 10.2 mg/dL (ref 8.4–10.5)
Chloride: 106 mEq/L (ref 96–112)
Creatinine, Ser: 1.01 mg/dL (ref 0.40–1.50)
GFR: 71.76 mL/min (ref >60.00)
Glucose, Bld: 84 mg/dL (ref 70–99)
Potassium: 4.4 mEq/L (ref 3.5–5.1)
Sodium: 140 mEq/L (ref 135–145)

## 2021-01-26 NOTE — Progress Notes (Signed)
Established Patient Office Visit  Subjective:  Patient ID: Jason [name], male    DOB: [birth year]  Age: 80 y.o. MRN: [id number]  CC:  Chief Complaint  Patient presents with  . Annual Exam    HPI Jason [name] presents for medical follow-up.  He has history of hypertension, obstructive sleep apnea, bilateral sensorineural hearing loss, osteoarthritis, hyperlipidemia, past history of atrial fibrillation, history of recurrent depression.  He is followed regularly by psychiatry.  He has had some recent low back pain was actually improved some with movement and walking.  No radiculitis features.  No lower extremity numbness or weakness.  No appetite or weight changes.  He has hyperlipidemia treated with simvastatin.  No recent lipids done.  He is on lithium per psychiatry.  He had lithium level back in December.  He has not had electrolytes since last year and no recent TSH.  He remains on amlodipine 5 mg daily for hypertension.  No recent dizziness.  No headaches.  He had questions about shingles vaccine.  He apparently had clinical shingles twice.  Other vaccines are up-to-date.  No history of hepatitis C screening and low risk.  Past Medical History:  Diagnosis Date  . Anxiety   . Atrial fibrillation ([place])    past hx  . Colon polyp   . Depression   . GERD (gastroesophageal reflux disease)   . Gout, unspecified   . Hypertension   . Osteoarthrosis, unspecified whether generalized or localized, unspecified site   . Pneumonia, organism unspecified(486)   . Pure hypercholesterolemia   . Routine general medical examination at a health care facility   . Sleep apnea    not using cpap currently  . Unspecified nonpsychotic mental disorder     Past Surgical History:  Procedure Laterality Date  . COLONOSCOPY  10/04/2006   jacobs  . neg hx    . UPPER GASTROINTESTINAL ENDOSCOPY  2015  . WISDOM TOOTH EXTRACTION      Family History  Problem Relation Age of Onset  . Dementia Mother   .  Pneumonia Father   . Heart failure Father   . Arthritis Other   . Colon cancer Neg Hx   . Colon polyps Neg Hx   . Esophageal cancer Neg Hx   . Stomach cancer Neg Hx   . Rectal cancer Neg Hx     Social History   Socioeconomic History  . Marital status: Married    Spouse name: [name]  . Number of children: 0  . Years of education: Not on file  . Highest education level: Not on file  Occupational History  . Occupation: retired    Fish farm manager: [employer]  Tobacco Use  . Smoking status: Never Smoker  . Smokeless tobacco: Never Used  Vaping Use  . Vaping Use: Never used  Substance and Sexual Activity  . Alcohol use: No  . Drug use: No  . Sexual activity: Not on file  Other Topics Concern  . Not on file  Social History Narrative  . Not on file   Social Determinants of Health   Financial Resource Strain: Not on file  Food Insecurity: Not on file  Transportation Needs: Not on file  Physical Activity: Not on file  Stress: Not on file  Social Connections: Not on file  Intimate Partner Violence: Not on file    Outpatient Medications Prior to Visit  Medication Sig Dispense Refill  . amLODipine (NORVASC) 5 MG tablet TAKE 1 TABLET BY MOUTH EVERY DAY  90 tablet 1  . donepezil (ARICEPT) 10 MG tablet Take 1 tablet (10 mg total) by mouth at bedtime. 90 tablet 3  . lithium carbonate 150 MG capsule Take 3 capsules (450 mg total) by mouth at bedtime. 270 capsule 1  . meloxicam (MOBIC) 15 MG tablet Take 15 mg by mouth as needed for pain.    . Methylfol-Algae-B12-Acetylcyst (CEREFOLIN NAC) 6-90.314-2-600 MG TABS TAKE 1 TABLET BY MOUTH EVERY DAY 90 tablet 3  . MYRBETRIQ 25 MG TB24 tablet TAKE 1 TABLET BY MOUTH EVERY DAY 90 tablet 3  . polyethylene glycol (MIRALAX) 17 g packet Take 17 g by mouth daily as needed for moderate constipation. (Patient not taking: Reported on 12/08/2020) 14 each 0  . senna-docusate (SENOKOT-S) 8.6-50 MG tablet Take 2 tablets by mouth daily as needed for mild  constipation. (Patient not taking: Reported on 12/08/2020) 30 tablet 0  . sertraline (ZOLOFT) 100 MG tablet Take 2 tablets (200 mg total) by mouth daily. 180 tablet 1  . simvastatin (ZOCOR) 20 MG tablet TAKE 1 TABLET BY MOUTH EVERY DAY 90 tablet 1  . Vitamin D, Ergocalciferol, (DRISDOL) 1.25 MG (50000 UNIT) CAPS capsule TAKE 1 CAPSULE EVERY 7 DAYS 12 capsule 1   No facility-administered medications prior to visit.    No Known Allergies  ROS Review of Systems  Constitutional: Negative for chills, fatigue, fever and unexpected weight change.  Eyes: Negative for visual disturbance.  Respiratory: Negative for cough, chest tightness and shortness of breath.   Cardiovascular: Negative for chest pain, palpitations and leg swelling.  Genitourinary: Negative for dysuria.  Musculoskeletal: Positive for back pain.  Neurological: Negative for dizziness, syncope, weakness, light-headedness and headaches.      Objective:    Physical Exam Vitals reviewed.  Constitutional:      Appearance: Normal appearance.  Cardiovascular:     Rate and Rhythm: Normal rate and regular rhythm.  Pulmonary:     Effort: Pulmonary effort is normal.     Breath sounds: Normal breath sounds.  Musculoskeletal:     Cervical back: Neck supple.     Right lower leg: No edema.     Left lower leg: No edema.  Lymphadenopathy:     Cervical: No cervical adenopathy.  Neurological:     Mental Status: He is alert.     There were no vitals taken for this visit. Wt Readings from Last 3 Encounters:  12/08/20 194 lb (88 kg)  04/22/20 199 lb 1.6 oz (90.3 kg)  04/29/19 204 lb 8 oz (92.8 kg)     Health Maintenance Due  Topic Date Due  . Hepatitis C Screening  Never done  . COLONOSCOPY (Pts 45-31yrs Insurance coverage will need to be confirmed)  09/15/2020    There are no preventive care reminders to display for this patient.  Lab Results  Component Value Date   TSH 2.73 09/11/2019   Lab Results  Component Value  Date   WBC 6.7 04/19/2020   HGB 13.2 04/19/2020   HCT 39.6 04/19/2020   MCV 96.8 04/19/2020   PLT 199 04/19/2020   Lab Results  Component Value Date   NA 138 04/19/2020   K 4.3 04/19/2020   CO2 20 (L) 04/19/2020   GLUCOSE 92 04/19/2020   BUN 16 04/19/2020   CREATININE 1.15 04/19/2020   BILITOT 0.8 04/19/2020   ALKPHOS 57 04/19/2020   AST 23 04/19/2020   ALT 19 04/19/2020   PROT 7.1 04/19/2020   ALBUMIN 4.4 04/19/2020   CALCIUM 9.7  04/19/2020   ANIONGAP 9 04/19/2020   GFR 73.48 09/11/2019   Lab Results  Component Value Date   CHOL 186 09/11/2019   Lab Results  Component Value Date   HDL 67.90 09/11/2019   Lab Results  Component Value Date   LDLCALC 105 (H) 09/11/2019   Lab Results  Component Value Date   TRIG 66.0 09/11/2019   Lab Results  Component Value Date   CHOLHDL 3 09/11/2019   No results found for: HGBA1C    Assessment & Plan:   Problem List Items Addressed This Visit      Unprioritized   HYPERCHOLESTEROLEMIA   Relevant Orders   Lipid panel   Hepatic function panel   Hypertension - Primary   Relevant Orders   Basic metabolic panel    Other Visit Diagnoses    High risk medication use       Relevant Orders   TSH   Encounter for hepatitis C screening test for low risk patient       Relevant Orders   Hep C Antibody    -Check TSH with his chronic lithium use. -Check basic metabolic panel, lipid panel, hepatic panel with simvastatin therapy. -We discussed Shingrix vaccine.  He will consider checking at pharmacy -Discussed low back pain.  No focal neurologic symptoms at this time.  Would recommend regular walking as tolerated and consider trial of physical therapy if symptoms persist  No orders of the defined types were placed in this encounter.   Follow-up: Return in about 6 months (around 07/26/2021).    Carolann Littler, MD

## 2021-01-27 LAB — HEPATITIS C ANTIBODY
Hepatitis C Ab: NONREACTIVE
SIGNAL TO CUT-OFF: 0.01 (ref <1.00)

## 2021-02-05 ENCOUNTER — Telehealth: Payer: Self-pay | Admitting: Gastroenterology

## 2021-02-05 NOTE — Telephone Encounter (Signed)
Patient and wife explained that hey had lunch with a family member on Wednesday this week and the family member started feeling bad on Thursday and did a home test and it was positive. At this time the pt and wife have no covid symptoms but they would like to postpone because they want to go by the [facility] recommendations. I offered to asked Dr.Jacobs but they still wants to reschedule to be safe. Patient had vaccines and booster on 11/03/2020. Rescheduled colonoscopy for 2/22 Tuesday and new prep instructions mailed to pt. Pt is aware.

## 2021-02-05 NOTE — Telephone Encounter (Signed)
Patients wife called and said they were exposed to someone that tested positive for Covid. Please advise

## 2021-02-06 ENCOUNTER — Other Ambulatory Visit: Payer: Self-pay | Admitting: Family Medicine

## 2021-02-09 ENCOUNTER — Encounter: Payer: Medicare Other | Admitting: Gastroenterology

## 2021-02-16 ENCOUNTER — Encounter: Payer: Medicare Other | Admitting: Gastroenterology

## 2021-03-05 ENCOUNTER — Other Ambulatory Visit: Payer: Self-pay | Admitting: Psychiatry

## 2021-03-09 ENCOUNTER — Encounter: Payer: Self-pay | Admitting: Gastroenterology

## 2021-04-21 ENCOUNTER — Encounter: Payer: Self-pay | Admitting: Gastroenterology

## 2021-04-21 ENCOUNTER — Ambulatory Visit (INDEPENDENT_AMBULATORY_CARE_PROVIDER_SITE_OTHER): Payer: Medicare Other | Admitting: Gastroenterology

## 2021-04-21 VITALS — BP 110/70 | HR 60 | Ht 73.0 in | Wt 190.0 lb

## 2021-04-21 DIAGNOSIS — Z860100 Personal history of colon polyps, unspecified: Secondary | ICD-10-CM | POA: Insufficient documentation

## 2021-04-21 DIAGNOSIS — Z01818 Encounter for other preprocedural examination: Secondary | ICD-10-CM | POA: Diagnosis not present

## 2021-04-21 MED ORDER — NA SULFATE-K SULFATE-MG SULF 17.5-3.13-1.6 GM/177ML PO SOLN
1.0000 | Freq: Once | ORAL | 0 refills | Status: DC
Start: 2021-04-21 — End: 2021-04-21

## 2021-04-21 NOTE — Progress Notes (Signed)
04/21/2021 Jason [name] [id number] January 24, 1941   HISTORY OF PRESENT ILLNESS:  This is a 80 year old male who is a patient of Dr. Ardis [name].  He is here today to discuss colonoscopy.  His last colonoscopy was in 08/2017 at which time he was found to have four polyps removed.  They were tubular adenomas on pathology and it was recommended that she have another colonoscopy in 3 years from that time.  Due to his age he was scheduled for an OV first to see if it was medically appropriate for him to proceed.  He feels well from a GI standpoint.  No complaints.  He says that he actually changed his diet and has been eating much healthier and that has helped him to move his bowels better and has allowed him to lose weight as well.  No significant changes in his medical history recently.   Past Medical History:  Diagnosis Date  . Anxiety   . Atrial fibrillation ([place])    past hx  . Colon polyp   . Depression   . GERD (gastroesophageal reflux disease)   . Gout, unspecified   . Hypertension   . Osteoarthrosis, unspecified whether generalized or localized, unspecified site   . Pneumonia, organism unspecified(486)   . Pure hypercholesterolemia   . Routine general medical examination at a health care facility   . Sleep apnea    not using cpap currently  . Unspecified nonpsychotic mental disorder    Past Surgical History:  Procedure Laterality Date  . COLONOSCOPY  10/04/2006   jacobs  . UPPER GASTROINTESTINAL ENDOSCOPY  2015  . WISDOM TOOTH EXTRACTION      reports that he has never smoked. He has never used smokeless tobacco. He reports that he does not drink alcohol and does not use drugs. family history includes Arthritis in an other family member; Dementia in his mother; Heart failure in his father; Pneumonia in his father. No Known Allergies    Outpatient Encounter Medications as of 04/21/2021  Medication Sig  . amLODipine (NORVASC) 5 MG tablet TAKE 1 TABLET BY MOUTH EVERY DAY  .  donepezil (ARICEPT) 10 MG tablet Take 1 tablet (10 mg total) by mouth at bedtime.  Marland Kitchen lithium carbonate 300 MG capsule Take 900 mg by mouth daily.  Marland Kitchen senna-docusate (SENOKOT-S) 8.6-50 MG tablet Take 2 tablets by mouth daily as needed for mild constipation.  . sertraline (ZOLOFT) 100 MG tablet Take 2 tablets (200 mg total) by mouth daily.  . simvastatin (ZOCOR) 20 MG tablet TAKE 1 TABLET BY MOUTH EVERY DAY  . Vitamin D, Ergocalciferol, (DRISDOL) 1.25 MG (50000 UNIT) CAPS capsule TAKE 1 CAPSULE EVERY 7 DAYS  . meloxicam (MOBIC) 15 MG tablet Take 15 mg by mouth as needed for pain. (Patient not taking: Reported on 04/21/2021)  . [DISCONTINUED] lithium carbonate 150 MG capsule Take 3 capsules (450 mg total) by mouth at bedtime.  . [DISCONTINUED] Methylfol-Algae-B12-Acetylcyst (CEREFOLIN NAC) 6-90.314-2-600 MG TABS TAKE 1 TABLET BY MOUTH EVERY DAY  . [DISCONTINUED] MYRBETRIQ 25 MG TB24 tablet TAKE 1 TABLET BY MOUTH EVERY DAY  . [DISCONTINUED] polyethylene glycol (MIRALAX) 17 g packet Take 17 g by mouth daily as needed for moderate constipation. (Patient not taking: Reported on 12/08/2020)   No facility-administered encounter medications on file as of 04/21/2021.     REVIEW OF SYSTEMS  : All other systems reviewed and negative except where noted in the History of Present Illness.   PHYSICAL EXAM: Ht 6\' 1"  (  1.854 m)   Wt 190 lb (86.2 kg)   BMI 25.07 kg/m  General: Well developed white male in no acute distress Head: Normocephalic and atraumatic Eyes:  Sclerae anicteric, conjunctiva pink. Ears: Normal auditory acuity Lungs: Clear throughout to auscultation; no W/R/R. Heart: Regular rate and rhythm; no M/R/G. Abdomen: Soft, non-distended.  BS present.  Non-tender. Rectal:  Will be done at the time of colonoscopy. Musculoskeletal: Symmetrical with no gross deformities  Skin: No lesions on visible extremities Extremities: No edema  Neurological: Alert oriented x 4, grossly  non-focal Psychological:  Alert and cooperative. Normal mood and affect  ASSESSMENT AND PLAN: *Personal history of colon polyps:  Last colonoscopy 08/2017 with 3 year recall recommended.  I think that he is medically fit to have colonoscopy at this time.  Will proceed with colonoscopy with Dr. Ardis [name].  The risks, benefits, and alternatives to colonoscopy were discussed with the patient and he consents to proceed.   CC:  Jason Post, MD

## 2021-04-21 NOTE — Patient Instructions (Addendum)
If you are age 78 or older, your body mass index should be between 23-30. Your Body mass index is 25.07 kg/m. If this is out of the aforementioned range listed, please consider follow up with your Primary Care Provider.  If you are age 100 or younger, your body mass index should be between 19-25. Your Body mass index is 25.07 kg/m. If this is out of the aformentioned range listed, please consider follow up with your Primary Care Provider.   You have been scheduled for a colonoscopy. Please follow written instructions given to you at your visit today.  Please pick up your prep supplies at the pharmacy within the next 1-3 days. If you use inhalers (even only as needed), please bring them with you on the day of your procedure.  Thank you for choosing me and [facility].  Alonza Bogus, PA-C

## 2021-04-22 NOTE — Progress Notes (Signed)
I agree with the above note, plan 

## 2021-05-10 ENCOUNTER — Other Ambulatory Visit: Payer: Self-pay | Admitting: Family Medicine

## 2021-05-12 ENCOUNTER — Ambulatory Visit: Payer: Medicare Other | Admitting: Psychiatry

## 2021-05-12 ENCOUNTER — Encounter: Payer: Medicare Other | Admitting: Gastroenterology

## 2021-06-10 ENCOUNTER — Other Ambulatory Visit: Payer: Self-pay

## 2021-06-10 ENCOUNTER — Encounter: Payer: Self-pay | Admitting: Psychiatry

## 2021-06-10 ENCOUNTER — Ambulatory Visit (INDEPENDENT_AMBULATORY_CARE_PROVIDER_SITE_OTHER): Payer: Medicare Other | Admitting: Psychiatry

## 2021-06-10 DIAGNOSIS — G3184 Mild cognitive impairment, so stated: Secondary | ICD-10-CM | POA: Diagnosis not present

## 2021-06-10 DIAGNOSIS — F411 Generalized anxiety disorder: Secondary | ICD-10-CM | POA: Diagnosis not present

## 2021-06-10 DIAGNOSIS — F3342 Major depressive disorder, recurrent, in full remission: Secondary | ICD-10-CM

## 2021-06-10 DIAGNOSIS — F422 Mixed obsessional thoughts and acts: Secondary | ICD-10-CM

## 2021-06-10 MED ORDER — DONEPEZIL HCL 10 MG PO TABS: 10.0000 mg | ORAL_TABLET | Freq: Every day | ORAL | 3 refills | Status: DC

## 2021-06-10 MED ORDER — LITHIUM CARBONATE 150 MG PO CAPS: 450.0000 mg | ORAL_CAPSULE | Freq: Every day | ORAL | 1 refills | Status: DC

## 2021-06-10 MED ORDER — SERTRALINE HCL 100 MG PO TABS: 200.0000 mg | ORAL_TABLET | Freq: Every day | ORAL | 1 refills | Status: DC

## 2021-06-10 NOTE — Progress Notes (Signed)
Jason [name] [id number] September 29, 1941 80 y.o.  Subjective:   Patient ID:  Jason [name] is a 80 y.o. (DOB 03-Nov-1941) male.  Chief Complaint:  Chief Complaint  Patient presents with   Follow-up   Recurrent major depression in complete remission ([name])   Sleeping Problem    Depression        Associated symptoms include no decreased concentration and no suicidal ideas.  Seen with wife. [name] presents to the office today for follow-up of MDE, OCD and GAD.  seen August, 2020.  No meds were changed.  5/5//21  Seen with wife [name]. Had mild Covid in fall 2020 and got the vaccine.   CO forgetfulness with names and places but not getting words out correctly.   Wife notices the same but she's not overly concerned but she has noticed occ gets confused about people. PCP checked b12 normal bc pt brought up memory concern.   11/16/20 appt with following noted:  Seen with wife [name] Still doing well. Occ spellls of anxiety randomly.  Does better if doesn't forget meds. No SE.  Memory trouble limited mainly to names.  Not much worse.  OK otherwise. Depression can occur but only last a day.  Depression and anxiety under control.  Except notices Feb each year a change in disposition but it's minor.  Patient reports stable mood and denies depressed or irritable moods.   Patient denies difficulty with sleep initiation or maintenance but it is split with some napping.  EMA. Denies appetite disturbance.  Patient reports that energy and motivation have been good.  Patient denies any difficulty with concentration.  Patient denies any suicidal ideation. Stress caring for inlaws in their 90s.  F good cognition.  M cognitive problems. Plan: Patient's lithium level was 0.51-year ago on 600 mg daily.  Level on 11/28/2020 was 0.9 on 600 mg daily.  The lithium level has crept up a little bit.  I am concerned it may start causing side effects.  Therefore a prescription has been sent so that he can reduce the dose to  450 mg daily by using 3 of the 150 mg capsules.  06/10/2021 appointment with the following noted: seen with wife Doing fine and no problems with reduction in lithium to 450 mg daily. Frustration and down with world and politics and can interfere with sleep lately better. Staying up later and more irregular pattern.  Will nap at times. Questions about meds  Past Psychiatric Medication Trials: Wellbutrin side effects, Lexapro nausea, Abilify, paroxetine, fluoxetine briefly, do duloxetine, ECT, Effexor with a history of benefit, mirtazapine, Pristiq 100, buspirone side effects, sertraline 200, lithium 600, olanzapine 2.5 with benefit.  Review of Systems:  Review of Systems  Respiratory:  Negative for chest tightness.   Cardiovascular:  Negative for chest pain and palpitations.  Musculoskeletal:  Positive for back pain.  Neurological:  Negative for tremors and weakness.  Psychiatric/Behavioral:  Negative for agitation, behavioral problems, confusion, decreased concentration, dysphoric mood, hallucinations, self-injury, sleep disturbance and suicidal ideas. The patient is nervous/anxious. The patient is not hyperactive.    Medications: I have reviewed the patient's current medications.  Current Outpatient Medications  Medication Sig Dispense Refill   amLODipine (NORVASC) 5 MG tablet TAKE 1 TABLET BY MOUTH EVERY DAY 90 tablet 1   donepezil (ARICEPT) 10 MG tablet Take 1 tablet (10 mg total) by mouth at bedtime. 90 tablet 3   lithium carbonate 150 MG capsule Take 450 mg by mouth daily.  Methylfol-Methylcob-Acetylcyst (CEREFOLIN NAC) 6-2-600 MG TABS Take by mouth.     mirabegron ER (MYRBETRIQ) 25 MG TB24 tablet Take 25 mg by mouth daily.     senna-docusate (SENOKOT-S) 8.6-50 MG tablet Take 2 tablets by mouth daily as needed for mild constipation. 30 tablet 0   sertraline (ZOLOFT) 100 MG tablet Take 2 tablets (200 mg total) by mouth daily. 180 tablet 1   simvastatin (ZOCOR) 20 MG tablet TAKE 1  TABLET BY MOUTH EVERY DAY 90 tablet 1   Vitamin D, Ergocalciferol, (DRISDOL) 1.25 MG (50000 UNIT) CAPS capsule TAKE 1 CAPSULE EVERY 7 DAYS 12 capsule 1   meloxicam (MOBIC) 15 MG tablet Take 15 mg by mouth as needed for pain. (Patient not taking: No sig reported)     No current facility-administered medications for this visit.    Medication Side Effects: None except occ jerks from lithium  Allergies: No Known Allergies  Past Medical History:  Diagnosis Date   Anxiety    Atrial fibrillation (HCC)    past hx   Colon polyp    Depression    GERD (gastroesophageal reflux disease)    Gout, unspecified    Hypertension    Osteoarthrosis, unspecified whether generalized or localized, unspecified site    Pneumonia, organism unspecified(486)    Pure hypercholesterolemia    Routine general medical examination at a health care facility    Sleep apnea    not using cpap currently   Unspecified nonpsychotic mental disorder     Family History  Problem Relation Age of Onset   Dementia Mother    Pneumonia Father    Heart failure Father    Arthritis Other    Colon cancer Neg Hx    Colon polyps Neg Hx    Esophageal cancer Neg Hx    Stomach cancer Neg Hx    Rectal cancer Neg Hx     Social History   Socioeconomic History   Marital status: Married    Spouse name: [name]   Number of children: 0   Years of education: Not on file   Highest education level: Not on file  Occupational History   Occupation: retired    Fish farm manager: [employer]  Tobacco Use   Smoking status: Never   Smokeless tobacco: Never  Vaping Use   Vaping Use: Never used  Substance and Sexual Activity   Alcohol use: No   Drug use: No   Sexual activity: Not on file  Other Topics Concern   Not on file  Social History Narrative   Not on file   Social Determinants of Health   Financial Resource Strain: Not on file  Food Insecurity: Not on file  Transportation Needs: Not on file  Physical Activity: Not on file   Stress: Not on file  Social Connections: Not on file  Intimate Partner Violence: Not on file    Past Medical History, Surgical history, Social history, and Family history were reviewed and updated as appropriate.   Please see review of systems for further details on the patient's review from today.   Objective:   Physical Exam:  There were no vitals taken for this visit.  Physical Exam Constitutional:      General: He is not in acute distress.    Appearance: He is well-developed.  Musculoskeletal:        General: No deformity.  Neurological:     Mental Status: He is alert and oriented to person, place, and time.     Motor: No  tremor.     Coordination: Coordination normal.  Psychiatric:        Attention and Perception: Attention normal. He is attentive. He does not perceive auditory hallucinations.        Mood and Affect: Mood normal. Mood is not anxious or depressed. Affect is not labile, blunt, angry or inappropriate.        Speech: Speech normal. Speech is not slurred.        Behavior: Behavior normal. Behavior is not slowed.        Thought Content: Thought content normal. Thought content does not include homicidal or suicidal ideation.        Cognition and Memory: Cognition normal.        Judgment: Judgment normal.     Comments: Insight is fair to good.    Lab Review:     Component Value Date/Time   NA 140 01/26/2021 1003   K 4.4 01/26/2021 1003   CL 106 01/26/2021 1003   CO2 28 01/26/2021 1003   GLUCOSE 84 01/26/2021 1003   BUN 18 01/26/2021 1003   CREATININE 1.01 01/26/2021 1003   CALCIUM 10.2 01/26/2021 1003   PROT 6.6 01/26/2021 1003   ALBUMIN 4.5 01/26/2021 1003   AST 22 01/26/2021 1003   ALT 17 01/26/2021 1003   ALKPHOS 59 01/26/2021 1003   BILITOT 0.8 01/26/2021 1003   GFRNONAA >60 04/19/2020 1247   GFRAA >60 04/19/2020 1247       Component Value Date/Time   WBC 6.7 04/19/2020 1247   RBC 4.09 (L) 04/19/2020 1247   HGB 13.2 04/19/2020 1247   HCT  39.6 04/19/2020 1247   PLT 199 04/19/2020 1247   MCV 96.8 04/19/2020 1247   MCH 32.3 04/19/2020 1247   MCHC 33.3 04/19/2020 1247   RDW 12.6 04/19/2020 1247   LYMPHSABS 1.8 01/13/2014 1134   MONOABS 0.4 01/13/2014 1134   EOSABS 0.3 01/13/2014 1134   BASOSABS 0.0 01/13/2014 1134   Normal TSH in September.  Last lithium 0.8 in July 2019  Lithium level January 10, 2027 0.5  Lithium Lvl  Date Value Ref Range Status  11/27/2020 0.9 0.6 - 1.2 mmol/L Final     No results found for: PHENYTOIN, PHENOBARB, VALPROATE, CBMZ   .res Assessment: Plan:    Recurrent major depression in complete remission ([place])  Mixed obsessional thoughts and acts  Generalized anxiety disorder  Mild cognitive impairment   Mr. Ruta has a long history of the above diagnoses which have been treatment resistant at times and multiple relapses.  He ultimately responded to the combination of sertraline and lithium and Zyprexa.  He has been able to wean off the Zyprexa without complication.  He's continuing to do well.  As noted multiple failed meds and a history of treatment resistance. Each diagnosis under control and meds helping.   Extensive disucussion of sleep hygiene.  Also has nocturia.  Supportive therapy dealing with sick in-laws and caretaking.  Encourage self-care and recreation.  Counseled patient regarding potential benefits, risks, and side effects of lithium to include potential risk of lithium affecting thyroid and renal function.  Discussed need for periodic lab monitoring to determine drug level and to assess for potential adverse effects.  Counseled patient regarding signs and symptoms of lithium toxicity and advised that they notify office immediately or seek urgent medical attention if experiencing these signs and symptoms.  Patient advised to contact office with any questions or concerns.  Call if any changes in BP meds.  Disc  DDI  Lithium level is in the low normal range and is typical for  the levels he has had over the last couple of years.   Reduced lithium from 600 to 450 Dec 2021 bc Cr creep without problems. Switch lithium to AM to see if nocturia is helped.  Disc the difference between Cerefolin NAC and Metafolbic plus angain in detail.Marland Kitchen option switch to NAC & B complex  Disc donepezil 10 mg nightly.  Does not have Alzheimer's but is bothered enough with forgetfulness to warrant the benefit.  Good grasp of politics.  Answered questions about OTC memory meds.  High relapse risk if reduce meds.  But rarely high dose sertraline can interfere with cognition but he's at risk with reduction. No med changes. Continue lithium 450 mg daily Sertraline 200 Aricept 10  This appt was 30 mins.  FU 6 mos  Lynder Parents, MD, DFAPA     Please see After Visit Summary for patient specific instructions.  Future Appointments  Date Time Provider [place]  07/09/2021 11:00 AM Milus Banister, MD [facility]    No orders of the defined types were placed in this encounter.     -------------------------------

## 2021-06-30 ENCOUNTER — Other Ambulatory Visit: Payer: Self-pay | Admitting: Family Medicine

## 2021-07-09 ENCOUNTER — Ambulatory Visit (AMBULATORY_SURGERY_CENTER): Payer: Medicare Other | Admitting: Gastroenterology

## 2021-07-09 ENCOUNTER — Other Ambulatory Visit: Payer: Self-pay

## 2021-07-09 ENCOUNTER — Encounter: Payer: Self-pay | Admitting: Gastroenterology

## 2021-07-09 VITALS — BP 112/59 | HR 64 | Temp 96.8°F | Resp 27 | Ht 73.0 in | Wt 190.0 lb

## 2021-07-09 DIAGNOSIS — Z86010 Personal history of colonic polyps: Secondary | ICD-10-CM

## 2021-07-09 DIAGNOSIS — K635 Polyp of colon: Secondary | ICD-10-CM

## 2021-07-09 DIAGNOSIS — D122 Benign neoplasm of ascending colon: Secondary | ICD-10-CM

## 2021-07-09 MED ORDER — SODIUM CHLORIDE 0.9 % IV SOLN: 500.0000 mL | Freq: Once | INTRAVENOUS | Status: DC

## 2021-07-09 NOTE — Progress Notes (Signed)
To PACU, VSS. Report to [email or web address] 

## 2021-07-09 NOTE — Patient Instructions (Signed)
Handout given;  Diverticulosis, Hemorrhoids, polyps Resume previous diet Continue current medications Await pathology results  YOU HAD AN ENDOSCOPIC PROCEDURE TODAY AT [place]:   Refer to the procedure report that was given to you for any specific questions about what was found during the examination.  If the procedure report does not answer your questions, please call your gastroenterologist to clarify.  If you requested that your care partner not be given the details of your procedure findings, then the procedure report has been included in a sealed envelope for you to review at your convenience later.  YOU SHOULD EXPECT: Some feelings of bloating in the abdomen. Passage of more gas than usual.  Walking can help get rid of the air that was put into your GI tract during the procedure and reduce the bloating. If you had a lower endoscopy (such as a colonoscopy or flexible sigmoidoscopy) you may notice spotting of blood in your stool or on the toilet paper. If you underwent a bowel prep for your procedure, you may not have a normal bowel movement for a few days.  Please Note:  You might notice some irritation and congestion in your nose or some drainage.  This is from the oxygen used during your procedure.  There is no need for concern and it should clear up in a day or so.  SYMPTOMS TO REPORT IMMEDIATELY:  Following lower endoscopy (colonoscopy or flexible sigmoidoscopy):  Excessive amounts of blood in the stool  Significant tenderness or worsening of abdominal pains  Swelling of the abdomen that is new, acute  Fever of 100F or higher  For urgent or emergent issues, a gastroenterologist can be reached at any hour by calling [phone]. Do not use MyChart messaging for urgent concerns.   DIET:  We do recommend a small meal at first, but then you may proceed to your regular diet.  Drink plenty of fluids but you should avoid alcoholic beverages for 24 hours.  ACTIVITY:   You should plan to take it easy for the rest of today and you should NOT DRIVE or use heavy machinery until tomorrow (because of the sedation medicines used during the test).    FOLLOW UP: Our staff will call the number listed on your records 48-72 hours following your procedure to check on you and address any questions or concerns that you may have regarding the information given to you following your procedure. If we do not reach you, we will leave a message.  We will attempt to reach you two times.  During this call, we will ask if you have developed any symptoms of COVID 19. If you develop any symptoms (ie: fever, flu-like symptoms, shortness of breath, cough etc.) before then, please call [phone].  If you test positive for Covid 19 in the 2 weeks post procedure, please call and report this information to [place].    If any biopsies were taken you will be contacted by phone or by letter within the next 1-3 weeks.  Please call us at [phone] if you have not heard about the biopsies in 3 weeks.   SIGNATURES/CONFIDENTIALITY: You and/or your care partner have signed paperwork which will be entered into your electronic medical record.  These signatures attest to the fact that that the information above on your After Visit Summary has been reviewed and is understood.  Full responsibility of the confidentiality of this discharge information lies with you and/or your care-partner.

## 2021-07-09 NOTE — Progress Notes (Signed)
VS taken by [place] 

## 2021-07-09 NOTE — Op Note (Signed)
[place] Patient Name: Jason [name] Procedure Date: 07/09/2021 10:20 AM MRN: [id number] Endoscopist: Milus Banister , MD Age: 80 Referring MD:  Date of Birth: 16-Jul-1941 Gender: Male Account #: [id number] Procedure:                Colonoscopy Indications:              High risk colon cancer surveillance: Personal                            history of colonic polyps; Colonoscopy 2018 four                            subCM adenomas removed. Medicines:                Monitored Anesthesia Care Procedure:                Pre-Anesthesia Assessment:                           - Prior to the procedure, a History and Physical                            was performed, and patient medications and                            allergies were reviewed. The patient's tolerance of                            previous anesthesia was also reviewed. The risks                            and benefits of the procedure and the sedation                            options and risks were discussed with the patient.                            All questions were answered, and informed consent                            was obtained. Prior Anticoagulants: The patient has                            taken no previous anticoagulant or antiplatelet                            agents. ASA Grade Assessment: II - A patient with                            mild systemic disease. After reviewing the risks                            and benefits, the patient was deemed in  satisfactory condition to undergo the procedure.                           After obtaining informed consent, the colonoscope                            was passed under direct vision. Throughout the                            procedure, the patient's blood pressure, pulse, and                            oxygen saturations were monitored continuously. The                            CF [id number] #[id number] was introduced through  the anus                            and advanced to the the cecum, identified by                            appendiceal orifice and ileocecal valve. The                            colonoscopy was performed without difficulty. The                            patient tolerated the procedure well. The quality                            of the bowel preparation was good. The ileocecal                            valve, appendiceal orifice, and rectum were                            photographed. Scope In: 10:32:57 AM Scope Out: 10:43:24 AM Scope Withdrawal Time: 0 hours 7 minutes 26 seconds  Total Procedure Duration: 0 hours 10 minutes 27 seconds  Findings:                 A 2 mm polyp was found in the ascending colon. The                            polyp was sessile. The polyp was removed with a                            cold snare. Resection and retrieval were complete.                           Multiple small and large-mouthed diverticula were                            found in the left colon.  Internal hemorrhoids were found. The hemorrhoids                            were small.                           The exam was otherwise without abnormality on                            direct and retroflexion views. Complications:            No immediate complications. Estimated blood loss:                            None. Estimated Blood Loss:     Estimated blood loss: none. Impression:               - One 2 mm polyp in the ascending colon, removed                            with a cold snare. Resected and retrieved.                           - Diverticulosis in the left colon.                           - Internal hemorrhoids.                           - The examination was otherwise normal on direct                            and retroflexion views. Recommendation:           - Patient has a contact number available for                            emergencies. The signs  and symptoms of potential                            delayed complications were discussed with the                            patient. Return to normal activities tomorrow.                            Written discharge instructions were provided to the                            patient.                           - Resume previous diet.                           - Continue present medications.                           -  Await pathology results. Milus Banister, MD 07/09/2021 10:46:01 AM This report has been signed electronically.

## 2021-07-13 ENCOUNTER — Telehealth: Payer: Self-pay | Admitting: *Deleted

## 2021-07-13 NOTE — Telephone Encounter (Signed)
  Follow up Call-  Call back number 07/09/2021  Post procedure Call Back phone  # [phone]  Permission to leave phone message Yes  Some recent data might be hidden     Patient questions:  Do you have a fever, pain , or abdominal swelling? No. Pain Score  0 *  Have you tolerated food without any problems? Yes.    Have you been able to return to your normal activities? Yes.    Do you have any questions about your discharge instructions: Diet   No. Medications  No. Follow up visit  No.  Do you have questions or concerns about your Care? No.  Actions: * If pain score is 4 or above: No action needed, pain <4.

## 2021-07-15 ENCOUNTER — Encounter: Payer: Self-pay | Admitting: Gastroenterology

## 2021-07-26 ENCOUNTER — Other Ambulatory Visit: Payer: Self-pay | Admitting: Family Medicine

## 2021-09-02 ENCOUNTER — Encounter: Payer: Self-pay | Admitting: Family Medicine

## 2021-09-13 DIAGNOSIS — H524 Presbyopia: Secondary | ICD-10-CM | POA: Diagnosis not present

## 2021-09-13 DIAGNOSIS — H2513 Age-related nuclear cataract, bilateral: Secondary | ICD-10-CM | POA: Diagnosis not present

## 2021-09-17 DIAGNOSIS — Z23 Encounter for immunization: Secondary | ICD-10-CM | POA: Diagnosis not present

## 2021-10-09 DIAGNOSIS — U071 COVID-19: Secondary | ICD-10-CM | POA: Diagnosis not present

## 2021-11-06 ENCOUNTER — Other Ambulatory Visit: Payer: Self-pay | Admitting: Family Medicine

## 2021-12-09 ENCOUNTER — Other Ambulatory Visit: Payer: Self-pay

## 2021-12-09 ENCOUNTER — Encounter: Payer: Self-pay | Admitting: Psychiatry

## 2021-12-09 ENCOUNTER — Ambulatory Visit (INDEPENDENT_AMBULATORY_CARE_PROVIDER_SITE_OTHER): Payer: Medicare Other | Admitting: Psychiatry

## 2021-12-09 DIAGNOSIS — F422 Mixed obsessional thoughts and acts: Secondary | ICD-10-CM

## 2021-12-09 DIAGNOSIS — G3184 Mild cognitive impairment, so stated: Secondary | ICD-10-CM

## 2021-12-09 DIAGNOSIS — F411 Generalized anxiety disorder: Secondary | ICD-10-CM

## 2021-12-09 DIAGNOSIS — F3342 Major depressive disorder, recurrent, in full remission: Secondary | ICD-10-CM | POA: Diagnosis not present

## 2021-12-09 MED ORDER — LITHIUM CARBONATE 150 MG PO CAPS: 450.0000 mg | ORAL_CAPSULE | Freq: Every day | ORAL | 1 refills | Status: DC

## 2021-12-09 MED ORDER — SERTRALINE HCL 100 MG PO TABS
200.0000 mg | ORAL_TABLET | Freq: Every day | ORAL | 1 refills | Status: DC
Start: 2021-12-09 — End: 2022-06-06

## 2021-12-09 NOTE — Progress Notes (Signed)
Jason [name] [id number] 1941-12-08 80 y.o.  Subjective:   Patient ID:  Jason [name] is a 80 y.o. (DOB 06-30-1941) male.  Chief Complaint:  Chief Complaint  Patient presents with   Follow-up   Depression    Depression        Associated symptoms include no decreased concentration and no suicidal ideas.  Seen with wife. Jason [name] presents to the office today for follow-up of MDE, OCD and GAD.  seen August, 2020.  No meds were changed.  5/5//21  Seen with wife Jason [name]. Had mild Covid in fall 2020 and got the vaccine.   CO forgetfulness with names and places but not getting words out correctly.   Wife notices the same but she's not overly concerned but she has noticed occ gets confused about people. PCP checked b12 normal bc pt brought up memory concern.   11/16/20 appt with following noted:  Seen with wife Jason [name] Still doing well. Occ spellls of anxiety randomly.  Does better if doesn't forget meds. No SE.  Memory trouble limited mainly to names.  Not much worse.  OK otherwise. Depression can occur but only last a day.  Depression and anxiety under control.  Except notices Feb each year a change in disposition but it's minor.  Patient reports stable mood and denies depressed or irritable moods.   Patient denies difficulty with sleep initiation or maintenance but it is split with some napping.  EMA. Denies appetite disturbance.  Patient reports that energy and motivation have been good.  Patient denies any difficulty with concentration.  Patient denies any suicidal ideation. Stress caring for inlaws in their 90s.  F good cognition.  M cognitive problems. Plan: Patient's lithium level was 0.51-year ago on 600 mg daily.  Level on 11/28/2020 was 0.9 on 600 mg daily.  The lithium level has crept up a little bit.  I am concerned it may start causing side effects.  Therefore a prescription has been sent so that he can reduce the dose to 450 mg daily by using 3 of the 150 mg capsules.  06/10/2021  appointment with the following noted: seen with wife Doing fine and no problems with reduction in lithium to 450 mg daily. Frustration and down with world and politics and can interfere with sleep lately better. Staying up later and more irregular pattern.  Will nap at times. Questions about meds Plan no changes  12/09/21 appt noted: seen with wife Sertraline 200 mg daily and lithium 450 mg and donepezil 10 mg HS, Cerefolin NAC Consistent with meds.  No SE Doing well for 80 yo. Patient reports stable mood and denies depressed or irritable moods.  Patient denies any recent difficulty with anxiety.  Patient denies difficulty with sleep initiation or maintenance. Denies appetite disturbance.  Patient reports that energy and motivation have been good.  Patient denies any difficulty with concentration.  Patient denies any suicidal ideation. Helping homeless families.  Past Psychiatric Medication Trials: Wellbutrin side effects, Lexapro nausea, Abilify, paroxetine, fluoxetine briefly, do duloxetine, ECT, Effexor with a history of benefit, mirtazapine, Pristiq 100, buspirone side effects, sertraline 200, lithium 600, olanzapine 2.5 with benefit.  Review of Systems:  Review of Systems  Respiratory:  Negative for chest tightness.   Cardiovascular:  Negative for palpitations.  Musculoskeletal:  Positive for back pain.  Neurological:  Negative for tremors and weakness.  Psychiatric/Behavioral:  Negative for agitation, behavioral problems, confusion, decreased concentration, dysphoric mood, hallucinations, self-injury, sleep disturbance and suicidal ideas. The patient is  nervous/anxious. The patient is not hyperactive.    Medications: I have reviewed the patient's current medications.  Current Outpatient Medications  Medication Sig Dispense Refill   amLODipine (NORVASC) 5 MG tablet TAKE 1 TABLET BY MOUTH EVERY DAY 90 tablet 1   donepezil (ARICEPT) 10 MG tablet Take 1 tablet (10 mg total) by mouth at  bedtime. 90 tablet 3   lithium carbonate 150 MG capsule Take 3 capsules (450 mg total) by mouth daily. 270 capsule 1   Methylfol-Methylcob-Acetylcyst (CEREFOLIN NAC) 6-2-600 MG TABS Take by mouth.     sertraline (ZOLOFT) 100 MG tablet Take 2 tablets (200 mg total) by mouth daily. 180 tablet 1   simvastatin (ZOCOR) 20 MG tablet TAKE 1 TABLET BY MOUTH EVERY DAY 90 tablet 1   Vitamin D, Ergocalciferol, (DRISDOL) 1.25 MG (50000 UNIT) CAPS capsule TAKE 1 CAPSULE EVERY 7 DAYS 12 capsule 1   mirabegron ER (MYRBETRIQ) 25 MG TB24 tablet Take 25 mg by mouth daily. (Patient not taking: Reported on 12/09/2021)     senna-docusate (SENOKOT-S) 8.6-50 MG tablet Take 2 tablets by mouth daily as needed for mild constipation. (Patient not taking: Reported on 07/09/2021) 30 tablet 0   No current facility-administered medications for this visit.    Medication Side Effects: None except occ jerks from lithium  Allergies: No Known Allergies  Past Medical History:  Diagnosis Date   Anxiety    Atrial fibrillation (HCC)    past hx   Colon polyp    Depression    GERD (gastroesophageal reflux disease)    Gout, unspecified    Hypertension    Osteoarthrosis, unspecified whether generalized or localized, unspecified site    Pneumonia, organism unspecified(486)    Pure hypercholesterolemia    Routine general medical examination at a health care facility    Sleep apnea    not using cpap currently   Unspecified nonpsychotic mental disorder     Family History  Problem Relation Age of Onset   Dementia Mother    Pneumonia Father    Heart failure Father    Arthritis Other    Colon cancer Neg Hx    Colon polyps Neg Hx    Esophageal cancer Neg Hx    Stomach cancer Neg Hx    Rectal cancer Neg Hx     Social History   Socioeconomic History   Marital status: Married    Spouse name: Jason [name]   Number of children: 0   Years of education: Not on file   Highest education level: Not on file  Occupational History    Occupation: retired    Fish farm manager: [employer]  Tobacco Use   Smoking status: Never   Smokeless tobacco: Never  Vaping Use   Vaping Use: Never used  Substance and Sexual Activity   Alcohol use: No   Drug use: No   Sexual activity: Not on file  Other Topics Concern   Not on file  Social History Narrative   Not on file   Social Determinants of Health   Financial Resource Strain: Not on file  Food Insecurity: Not on file  Transportation Needs: Not on file  Physical Activity: Not on file  Stress: Not on file  Social Connections: Not on file  Intimate Partner Violence: Not on file    Past Medical History, Surgical history, Social history, and Family history were reviewed and updated as appropriate.   Please see review of systems for further details on the patient's review from today.   Objective:  Physical Exam:  There were no vitals taken for this visit.  Physical Exam Constitutional:      General: He is not in acute distress.    Appearance: He is well-developed.  Musculoskeletal:        General: No deformity.  Neurological:     Mental Status: He is alert and oriented to person, place, and time.     Motor: No tremor.     Coordination: Coordination normal.  Psychiatric:        Attention and Perception: Attention normal. He is attentive. He does not perceive auditory hallucinations.        Mood and Affect: Mood normal. Mood is not anxious or depressed. Affect is not labile, blunt, angry or inappropriate.        Speech: Speech normal. Speech is not slurred.        Behavior: Behavior normal. Behavior is not slowed.        Thought Content: Thought content normal. Thought content is not delusional. Thought content does not include homicidal or suicidal ideation.        Cognition and Memory: Cognition normal.        Judgment: Judgment normal.     Comments: Insight is fair to good.    Lab Review:     Component Value Date/Time   NA 140 01/26/2021 1003   K 4.4 01/26/2021  1003   CL 106 01/26/2021 1003   CO2 28 01/26/2021 1003   GLUCOSE 84 01/26/2021 1003   BUN 18 01/26/2021 1003   CREATININE 1.01 01/26/2021 1003   CALCIUM 10.2 01/26/2021 1003   PROT 6.6 01/26/2021 1003   ALBUMIN 4.5 01/26/2021 1003   AST 22 01/26/2021 1003   ALT 17 01/26/2021 1003   ALKPHOS 59 01/26/2021 1003   BILITOT 0.8 01/26/2021 1003   GFRNONAA >60 04/19/2020 1247   GFRAA >60 04/19/2020 1247       Component Value Date/Time   WBC 6.7 04/19/2020 1247   RBC 4.09 (L) 04/19/2020 1247   HGB 13.2 04/19/2020 1247   HCT 39.6 04/19/2020 1247   PLT 199 04/19/2020 1247   MCV 96.8 04/19/2020 1247   MCH 32.3 04/19/2020 1247   MCHC 33.3 04/19/2020 1247   RDW 12.6 04/19/2020 1247   LYMPHSABS 1.8 01/13/2014 1134   MONOABS 0.4 01/13/2014 1134   EOSABS 0.3 01/13/2014 1134   BASOSABS 0.0 01/13/2014 1134   Normal TSH in September.  Last lithium 0.8 in July 2019  Lithium level January 10, 2027 0.5  Lithium Lvl  Date Value Ref Range Status  11/27/2020 0.9 0.6 - 1.2 mmol/L Final     No results found for: PHENYTOIN, PHENOBARB, VALPROATE, CBMZ   .res Assessment: Plan:    Recurrent major depression in complete remission ([place])  Mixed obsessional thoughts and acts  Generalized anxiety disorder  Mild cognitive impairment   Jason [name] has a long history of the above diagnoses which have been treatment resistant at times and multiple relapses.  He ultimately responded to the combination of sertraline and lithium and Zyprexa.  He has been able to wean off the Zyprexa without complication.  He's continuing to do well.  As noted multiple failed meds and a history of treatment resistance. Each diagnosis under control and meds helping.   Extensive disucussion of sleep hygiene.  Also has nocturia.  Supportive therapy dealing with sick in-laws and caretaking.  Encourage self-care and recreation.  Also dealing with homeless people.  Counseled patient regarding potential benefits, risks, and  side effects of lithium to include potential risk of lithium affecting thyroid and renal function.  Discussed need for periodic lab monitoring to determine drug level and to assess for potential adverse effects.  Counseled patient regarding signs and symptoms of lithium toxicity and advised that they notify office immediately or seek urgent medical attention if experiencing these signs and symptoms.  Patient advised to contact office with any questions or concerns.  Call if any changes in BP meds.  Disc DDI  Lithium level is in the low normal range and is typical for the levels he has had over the last couple of years.   Reduced lithium from 600 to 450 Dec 2021 bc Cr creep without problems. Continue lithium to AM to see if nocturia is helped. Check lithium level.  Disc the difference between Cerefolin NAC and Metafolbic plus angain in detail.Marland Kitchen option switch to NAC & B complex  Disc donepezil 10 mg nightly.  Does not have Alzheimer's but is bothered enough with forgetfulness to warrant the benefit.  Good grasp of politics.  Answered questions about OTC memory meds.  High relapse risk if reduce meds.  But rarely high dose sertraline can interfere with cognition but he's at risk with reduction. No med changes. Continue lithium 450 mg daily Sertraline 200 Aricept 10  This appt was 30 mins.  FU 6 mos  Lynder Parents, MD, DFAPA     Please see After Visit Summary for patient specific instructions.  No future appointments.   No orders of the defined types were placed in this encounter.     -------------------------------

## 2021-12-09 NOTE — Patient Instructions (Signed)
Check lithium level

## 2021-12-28 ENCOUNTER — Other Ambulatory Visit: Payer: Self-pay | Admitting: Family Medicine

## 2022-01-04 DIAGNOSIS — F3342 Major depressive disorder, recurrent, in full remission: Secondary | ICD-10-CM | POA: Diagnosis not present

## 2022-01-05 LAB — LITHIUM LEVEL: Lithium Lvl: 0.6 mmol/L (ref 0.6–1.2)

## 2022-01-21 ENCOUNTER — Other Ambulatory Visit: Payer: Self-pay

## 2022-01-21 ENCOUNTER — Encounter: Payer: Self-pay | Admitting: Family Medicine

## 2022-01-21 ENCOUNTER — Other Ambulatory Visit: Payer: Self-pay | Admitting: Family Medicine

## 2022-01-21 DIAGNOSIS — M545 Low back pain, unspecified: Secondary | ICD-10-CM

## 2022-01-21 MED ORDER — MELOXICAM 15 MG PO TABS: 15.0000 mg | ORAL_TABLET | Freq: Every day | ORAL | 0 refills | Status: DC

## 2022-01-26 ENCOUNTER — Other Ambulatory Visit: Payer: Self-pay | Admitting: Family Medicine

## 2022-02-02 ENCOUNTER — Ambulatory Visit (INDEPENDENT_AMBULATORY_CARE_PROVIDER_SITE_OTHER): Payer: Medicare Other | Admitting: Family Medicine

## 2022-02-02 VITALS — BP 110/64 | HR 73 | Temp 97.9°F | Ht 73.0 in | Wt 204.5 lb

## 2022-02-02 DIAGNOSIS — R21 Rash and other nonspecific skin eruption: Secondary | ICD-10-CM | POA: Diagnosis not present

## 2022-02-02 DIAGNOSIS — I10 Essential (primary) hypertension: Secondary | ICD-10-CM | POA: Diagnosis not present

## 2022-02-02 DIAGNOSIS — E7800 Pure hypercholesterolemia, unspecified: Secondary | ICD-10-CM

## 2022-02-02 DIAGNOSIS — Z8679 Personal history of other diseases of the circulatory system: Secondary | ICD-10-CM | POA: Diagnosis not present

## 2022-02-02 DIAGNOSIS — Z79899 Other long term (current) drug therapy: Secondary | ICD-10-CM

## 2022-02-02 LAB — BASIC METABOLIC PANEL
BUN: 22 mg/dL (ref 6–23)
CO2: 27 mEq/L (ref 19–32)
Calcium: 10.1 mg/dL (ref 8.4–10.5)
Chloride: 106 mEq/L (ref 96–112)
Creatinine, Ser: 1.12 mg/dL (ref 0.40–1.50)
GFR: 62.93 mL/min (ref >60.00)
Glucose, Bld: 81 mg/dL (ref 70–99)
Potassium: 4.1 mEq/L (ref 3.5–5.1)
Sodium: 141 mEq/L (ref 135–145)

## 2022-02-02 LAB — LIPID PANEL
Cholesterol: 169 mg/dL (ref 0–200)
HDL: 80.8 mg/dL (ref >39.00)
LDL Cholesterol: 78 mg/dL (ref 0–99)
NonHDL: 88.05
Total CHOL/HDL Ratio: 2
Triglycerides: 51 mg/dL (ref 0.0–149.0)
VLDL: 10.2 mg/dL (ref 0.0–40.0)

## 2022-02-02 LAB — HEPATIC FUNCTION PANEL
ALT: 17 U/L (ref 0–53)
AST: 23 U/L (ref 0–37)
Albumin: 4.4 g/dL (ref 3.5–5.2)
Alkaline Phosphatase: 72 U/L (ref 39–117)
Bilirubin, Direct: 0.1 mg/dL (ref 0.0–0.3)
Total Bilirubin: 0.6 mg/dL (ref 0.2–1.2)
Total Protein: 7 g/dL (ref 6.0–8.3)

## 2022-02-02 LAB — TSH: TSH: 3.44 u[IU]/mL (ref 0.35–5.50)

## 2022-02-02 MED ORDER — TRIAMCINOLONE ACETONIDE 0.1 % EX CREA: 1.0000 "application " | TOPICAL_CREAM | Freq: Two times a day (BID) | CUTANEOUS | 2 refills | Status: DC | PRN

## 2022-02-02 MED ORDER — SIMVASTATIN 20 MG PO TABS: 20.0000 mg | ORAL_TABLET | Freq: Every day | ORAL | 3 refills | Status: DC

## 2022-02-02 NOTE — Patient Instructions (Signed)
Avoid scratching leg as much as possible  Avoid topical hydrogen peroxide or alcohol  Liberal use of moisturizer several times daily  May use the prescription cream twice daily as needed.

## 2022-02-02 NOTE — Progress Notes (Signed)
Established Patient Office Visit  Subjective:  Patient ID: Jason [name], male    DOB: 16-Mar-1941  Age: 81 y.o. MRN: [id number]  CC:  Chief Complaint  Patient presents with   Annual Exam    HPI [name] presents for medical follow-up.  He has history of hypertension, obstructive sleep apnea, not currently treated with CPAP, osteoarthritis, hyperlipidemia, history of transient atrial fibrillation, generalized anxiety.  He also has history of recurrent depression.  Is followed regularly by psychiatry and maintained on lithium and sertraline.  Recent lithium level was normal.  He is due for follow-up labs including thyroid functions, chemistries, lipids.  Taking simvastatin without difficulty.  No myalgias.  No recent chest pains.  Walks some for exercise.  Recently he called for request of meloxicam for osteoarthritis.  He was cautioned about taking any nonsteroidals with lithium.  He has hypertension treated with amlodipine 5 mg daily.  Blood pressure stable.  No recent dizziness.  He has a new problem of rash left leg medially.  Multiple excoriations.  Pruritic.  No generalized rash.  Does have very dry skin.  Uses Dove soap.  He apparently tried some hydrogen peroxide topically which may have dried this even more.  Wt Readings from Last 3 Encounters:  02/02/22 204 lb 8 oz (92.8 kg)  07/09/21 190 lb (86.2 kg)  04/21/21 190 lb (86.2 kg)     Past Medical History:  Diagnosis Date   Anxiety    Atrial fibrillation (HCC)    past hx   Colon polyp    Depression    GERD (gastroesophageal reflux disease)    Gout, unspecified    Hypertension    Osteoarthrosis, unspecified whether generalized or localized, unspecified site    Pneumonia, organism unspecified(486)    Pure hypercholesterolemia    Routine general medical examination at a health care facility    Sleep apnea    not using cpap currently   Unspecified nonpsychotic mental disorder     Past Surgical History:  Procedure  Laterality Date   COLONOSCOPY  10/04/2006   jacobs   UPPER GASTROINTESTINAL ENDOSCOPY  2015   WISDOM TOOTH EXTRACTION      Family History  Problem Relation Age of Onset   Dementia Mother    Pneumonia Father    Heart failure Father    Arthritis Other    Colon cancer Neg Hx    Colon polyps Neg Hx    Esophageal cancer Neg Hx    Stomach cancer Neg Hx    Rectal cancer Neg Hx     Social History   Socioeconomic History   Marital status: Married    Spouse name: [name]   Number of children: 0   Years of education: Not on file   Highest education level: Not on file  Occupational History   Occupation: retired    Fish farm manager: [employer]  Tobacco Use   Smoking status: Never   Smokeless tobacco: Never  Vaping Use   Vaping Use: Never used  Substance and Sexual Activity   Alcohol use: No   Drug use: No   Sexual activity: Not on file  Other Topics Concern   Not on file  Social History Narrative   Not on file   Social Determinants of Health   Financial Resource Strain: Not on file  Food Insecurity: Not on file  Transportation Needs: Not on file  Physical Activity: Not on file  Stress: Not on file  Social Connections: Not on file  Intimate Partner Violence: Not on file    Outpatient Medications Prior to Visit  Medication Sig Dispense Refill   amLODipine (NORVASC) 5 MG tablet TAKE 1 TABLET BY MOUTH EVERY DAY 90 tablet 1   donepezil (ARICEPT) 10 MG tablet Take 1 tablet (10 mg total) by mouth at bedtime. 90 tablet 3   lithium carbonate 150 MG capsule Take 3 capsules (450 mg total) by mouth daily. 270 capsule 1   Methylfol-Methylcob-Acetylcyst (CEREFOLIN NAC) 6-2-600 MG TABS Take by mouth.     mirabegron ER (MYRBETRIQ) 25 MG TB24 tablet Take 25 mg by mouth daily.     senna-docusate (SENOKOT-S) 8.6-50 MG tablet Take 2 tablets by mouth daily as needed for mild constipation. 30 tablet 0   sertraline (ZOLOFT) 100 MG tablet Take 2 tablets (200 mg total) by mouth daily. 180 tablet 1    Vitamin D, Ergocalciferol, (DRISDOL) 1.25 MG (50000 UNIT) CAPS capsule TAKE 1 CAPSULE EVERY 7 DAYS 12 capsule 1   meloxicam (MOBIC) 15 MG tablet Take 1 tablet (15 mg total) by mouth daily. 30 tablet 0   simvastatin (ZOCOR) 20 MG tablet Take 1 tablet (20 mg total) by mouth daily. Needs an appointment for further refills. 30 tablet 0   No facility-administered medications prior to visit.    No Known Allergies  ROS Review of Systems  Constitutional:  Negative for fatigue and unexpected weight change.  Eyes:  Negative for visual disturbance.  Respiratory:  Negative for cough, chest tightness and shortness of breath.   Cardiovascular:  Negative for chest pain, palpitations and leg swelling.  Musculoskeletal:  Positive for arthralgias.  Skin:  Positive for rash.  Neurological:  Negative for dizziness, syncope, weakness, light-headedness and headaches.     Objective:    Physical Exam Vitals reviewed.  Cardiovascular:     Rate and Rhythm: Normal rate and regular rhythm.  Pulmonary:     Effort: Pulmonary effort is normal.     Breath sounds: Normal breath sounds. No wheezing or rales.  Musculoskeletal:     Right lower leg: No edema.     Left lower leg: No edema.  Skin:    Comments: Nonspecific excoriated skin rash right leg medially.  No signs of secondary infection.  Neurological:     Mental Status: He is alert.    BP 110/64 (BP Location: Left Arm, Patient Position: Sitting, Cuff Size: Normal)    Pulse 73    Temp 97.9 F (36.6 C) (Oral)    Ht 6\' 1"  (1.854 m)    Wt 204 lb 8 oz (92.8 kg)    SpO2 98%    BMI 26.98 kg/m  Wt Readings from Last 3 Encounters:  02/02/22 204 lb 8 oz (92.8 kg)  07/09/21 190 lb (86.2 kg)  04/21/21 190 lb (86.2 kg)     Health Maintenance Due  Topic Date Due   Zoster Vaccines- Shingrix (1 of 2) Never done   COVID-19 Vaccine (4 - Booster for Pfizer series) 12/29/2020   INFLUENZA VACCINE  07/26/2021    There are no preventive care reminders to display  for this patient.  Lab Results  Component Value Date   TSH 3.35 01/26/2021   Lab Results  Component Value Date   WBC 6.7 04/19/2020   HGB 13.2 04/19/2020   HCT 39.6 04/19/2020   MCV 96.8 04/19/2020   PLT 199 04/19/2020   Lab Results  Component Value Date   NA 140 01/26/2021   K 4.4 01/26/2021   CO2 28 01/26/2021  GLUCOSE 84 01/26/2021   BUN 18 01/26/2021   CREATININE 1.01 01/26/2021   BILITOT 0.8 01/26/2021   ALKPHOS 59 01/26/2021   AST 22 01/26/2021   ALT 17 01/26/2021   PROT 6.6 01/26/2021   ALBUMIN 4.5 01/26/2021   CALCIUM 10.2 01/26/2021   ANIONGAP 9 04/19/2020   GFR 71.76 01/26/2021   Lab Results  Component Value Date   CHOL 172 01/26/2021   Lab Results  Component Value Date   HDL 73.30 01/26/2021   Lab Results  Component Value Date   LDLCALC 83 01/26/2021   Lab Results  Component Value Date   TRIG 82.0 01/26/2021   Lab Results  Component Value Date   CHOLHDL 2 01/26/2021   No results found for: HGBA1C    Assessment & Plan:   #1 hypertension stable and at goal.  Continue amlodipine 5 mg daily.  Work on weight loss  #2 hyperlipidemia.  Patient on simvastatin.  Has been on this combination for years.  There is potential interaction with amlodipine but he has never had difficulties with myalgias.  Recheck lipid and hepatic panel.  Refill simvastatin for 1 year.  #3 nonspecific excoriated rash left leg.  Very dry skin likely contributing.  Avoid scratching as much as possible.  Limit bathing time.  Avoid drying agents such as hydroperoxide and alcohol.  Triamcinolone 0.1% cream to use twice daily as needed and also liberal use of moisturizers  #4 high risk medicine use with lithium.  Needs follow-up chemistries and TSH   Meds ordered this encounter  Medications   simvastatin (ZOCOR) 20 MG tablet    Sig: Take 1 tablet (20 mg total) by mouth daily.    Dispense:  90 tablet    Refill:  3   triamcinolone cream (KENALOG) 0.1 %    Sig: Apply 1  application topically 2 (two) times daily as needed.    Dispense:  30 g    Refill:  2    Follow-up: No follow-ups on file.    Carolann Littler, MD

## 2022-02-12 ENCOUNTER — Other Ambulatory Visit: Payer: Self-pay | Admitting: Family Medicine

## 2022-02-12 DIAGNOSIS — M545 Low back pain, unspecified: Secondary | ICD-10-CM

## 2022-03-11 ENCOUNTER — Encounter (HOSPITAL_COMMUNITY): Payer: Self-pay | Admitting: Emergency Medicine

## 2022-03-11 ENCOUNTER — Emergency Department (HOSPITAL_COMMUNITY): Payer: Medicare Other

## 2022-03-11 ENCOUNTER — Inpatient Hospital Stay (HOSPITAL_COMMUNITY)
Admission: EM | Admit: 2022-03-11 | Discharge: 2022-03-15 | DRG: 522 | Disposition: A | Discharge status: Skilled Nursing Facility | Payer: Medicare Other | Attending: Student | Admitting: Student

## 2022-03-11 DIAGNOSIS — W19XXXA Unspecified fall, initial encounter: Secondary | ICD-10-CM | POA: Diagnosis not present

## 2022-03-11 DIAGNOSIS — W109XXA Fall (on) (from) unspecified stairs and steps, initial encounter: Secondary | ICD-10-CM | POA: Diagnosis present

## 2022-03-11 DIAGNOSIS — M11261 Other chondrocalcinosis, right knee: Secondary | ICD-10-CM | POA: Diagnosis not present

## 2022-03-11 DIAGNOSIS — S72009A Fracture of unspecified part of neck of unspecified femur, initial encounter for closed fracture: Secondary | ICD-10-CM | POA: Diagnosis present

## 2022-03-11 DIAGNOSIS — F39 Unspecified mood [affective] disorder: Secondary | ICD-10-CM | POA: Insufficient documentation

## 2022-03-11 DIAGNOSIS — F0393 Unspecified dementia, unspecified severity, with mood disturbance: Secondary | ICD-10-CM | POA: Diagnosis present

## 2022-03-11 DIAGNOSIS — S72011A Unspecified intracapsular fracture of right femur, initial encounter for closed fracture: Principal | ICD-10-CM | POA: Diagnosis present

## 2022-03-11 DIAGNOSIS — Z79899 Other long term (current) drug therapy: Secondary | ICD-10-CM

## 2022-03-11 DIAGNOSIS — M25761 Osteophyte, right knee: Secondary | ICD-10-CM | POA: Diagnosis not present

## 2022-03-11 DIAGNOSIS — M25751 Osteophyte, right hip: Secondary | ICD-10-CM | POA: Diagnosis present

## 2022-03-11 DIAGNOSIS — J302 Other seasonal allergic rhinitis: Secondary | ICD-10-CM | POA: Diagnosis present

## 2022-03-11 DIAGNOSIS — D696 Thrombocytopenia, unspecified: Secondary | ICD-10-CM

## 2022-03-11 DIAGNOSIS — R4189 Other symptoms and signs involving cognitive functions and awareness: Secondary | ICD-10-CM

## 2022-03-11 DIAGNOSIS — I10 Essential (primary) hypertension: Secondary | ICD-10-CM | POA: Diagnosis present

## 2022-03-11 DIAGNOSIS — S72041A Displaced fracture of base of neck of right femur, initial encounter for closed fracture: Secondary | ICD-10-CM | POA: Diagnosis not present

## 2022-03-11 DIAGNOSIS — S72001A Fracture of unspecified part of neck of right femur, initial encounter for closed fracture: Secondary | ICD-10-CM | POA: Diagnosis not present

## 2022-03-11 DIAGNOSIS — I440 Atrioventricular block, first degree: Secondary | ICD-10-CM | POA: Diagnosis present

## 2022-03-11 DIAGNOSIS — E7800 Pure hypercholesterolemia, unspecified: Secondary | ICD-10-CM | POA: Diagnosis present

## 2022-03-11 DIAGNOSIS — M1990 Unspecified osteoarthritis, unspecified site: Secondary | ICD-10-CM | POA: Diagnosis present

## 2022-03-11 DIAGNOSIS — D649 Anemia, unspecified: Secondary | ICD-10-CM

## 2022-03-11 DIAGNOSIS — M25551 Pain in right hip: Secondary | ICD-10-CM | POA: Diagnosis not present

## 2022-03-11 DIAGNOSIS — F411 Generalized anxiety disorder: Secondary | ICD-10-CM | POA: Diagnosis present

## 2022-03-11 DIAGNOSIS — G4733 Obstructive sleep apnea (adult) (pediatric): Secondary | ICD-10-CM | POA: Diagnosis present

## 2022-03-11 DIAGNOSIS — R6889 Other general symptoms and signs: Secondary | ICD-10-CM | POA: Diagnosis not present

## 2022-03-11 DIAGNOSIS — K219 Gastro-esophageal reflux disease without esophagitis: Secondary | ICD-10-CM | POA: Diagnosis present

## 2022-03-11 DIAGNOSIS — D62 Acute posthemorrhagic anemia: Secondary | ICD-10-CM | POA: Insufficient documentation

## 2022-03-11 DIAGNOSIS — M25451 Effusion, right hip: Secondary | ICD-10-CM | POA: Diagnosis not present

## 2022-03-11 DIAGNOSIS — Z8679 Personal history of other diseases of the circulatory system: Secondary | ICD-10-CM

## 2022-03-11 DIAGNOSIS — M1611 Unilateral primary osteoarthritis, right hip: Secondary | ICD-10-CM | POA: Diagnosis not present

## 2022-03-11 LAB — BASIC METABOLIC PANEL
Anion gap: 9 (ref 5–15)
BUN: 19 mg/dL (ref 8–23)
CO2: 21 mmol/L — ABNORMAL LOW (ref 22–32)
Calcium: 9.4 mg/dL (ref 8.9–10.3)
Chloride: 106 mmol/L (ref 98–111)
Creatinine, Ser: 0.93 mg/dL (ref 0.61–1.24)
GFR, Estimated: >60 mL/min (ref >60)
Glucose, Bld: 123 mg/dL — ABNORMAL HIGH (ref 70–99)
Potassium: 4.5 mmol/L (ref 3.5–5.1)
Sodium: 136 mmol/L (ref 135–145)

## 2022-03-11 LAB — CBC WITH DIFFERENTIAL/PLATELET
Abs Immature Granulocytes: 0.03 10*3/uL (ref 0.00–0.07)
Basophils Absolute: 0 10*3/uL (ref 0.0–0.1)
Basophils Relative: 0 %
Eosinophils Absolute: 0 10*3/uL (ref 0.0–0.5)
Eosinophils Relative: 0 %
HCT: 39.9 % (ref 39.0–52.0)
Hemoglobin: 13.4 g/dL (ref 13.0–17.0)
Immature Granulocytes: 0 %
Lymphocytes Relative: 7 %
Lymphs Abs: 0.7 10*3/uL (ref 0.7–4.0)
MCH: 32.8 pg (ref 26.0–34.0)
MCHC: 33.6 g/dL (ref 30.0–36.0)
MCV: 97.6 fL (ref 80.0–100.0)
Monocytes Absolute: 0.6 10*3/uL (ref 0.1–1.0)
Monocytes Relative: 6 %
Neutro Abs: 9.2 10*3/uL — ABNORMAL HIGH (ref 1.7–7.7)
Neutrophils Relative %: 87 %
Platelets: 147 10*3/uL — ABNORMAL LOW (ref 150–400)
RBC: 4.09 MIL/uL — ABNORMAL LOW (ref 4.22–5.81)
RDW: 12.6 % (ref 11.5–15.5)
WBC: 10.6 10*3/uL — ABNORMAL HIGH (ref 4.0–10.5)
nRBC: 0 % (ref 0.0–0.2)

## 2022-03-11 MED ORDER — FENTANYL CITRATE PF 50 MCG/ML IJ SOSY
50.0000 ug | PREFILLED_SYRINGE | Freq: Once | INTRAMUSCULAR | Status: AC
  Administered 2022-03-11: 50 ug via INTRAVENOUS
  Filled 2022-03-11: qty 1

## 2022-03-11 MED ORDER — FENTANYL CITRATE PF 50 MCG/ML IJ SOSY
50.0000 ug | PREFILLED_SYRINGE | INTRAMUSCULAR | Status: DC | PRN
  Administered 2022-03-12 (×3): 50 ug via INTRAVENOUS
  Filled 2022-03-11 (×3): qty 1

## 2022-03-11 NOTE — Consult Note (Signed)
? ?Jason [name] is an 81 y.o. male.   ? ?Chief Complaint:  right hip pain ? ?HPI: 81 y/o male with a ground level fall earlier today c/o moderate right hip pain. Pt was carrying food, slipped and fell onto right leg/hip. Able to limp to a vehicle and came to the emergency room. Denies any other injuries. Denies any syncope, chest pain or other cause for a fall. C/o mild pain to right anterior knee, but mostly pain to right hip. Denies any numbness or tingling distally. ? ?PCP:  Jason Post, MD ? ?PMH: ?Past Medical History:  ?Diagnosis Date  ? Anxiety   ? Atrial fibrillation ([place])   ? past hx  ? Colon polyp   ? Depression   ? GERD (gastroesophageal reflux disease)   ? Gout, unspecified   ? Hypertension   ? Osteoarthrosis, unspecified whether generalized or localized, unspecified site   ? Pneumonia, organism unspecified(486)   ? Pure hypercholesterolemia   ? Routine general medical examination at a health care facility   ? Sleep apnea   ? not using cpap currently  ? Unspecified nonpsychotic mental disorder   ? ? ?PSes:  ?No Known Allergies ? ?Medications: ?No current facility-administered medications for this encounter.  ? ?Current Outpatient Medications  ?Medication Sig Dispense Refill  ? amLODipine (NORVASC) 5 MG tablet TAKE 1 TABLET BY MOUTH EVERY DAY 90 tablet 1  ? donepezil (ARICEPT) 10 MG tablet Take 1 tablet (10 mg total) by mouth at bedtime. 90 tablet 3  ? lithium carbonate 150 MG capsule Take 3 capsules (450 mg total) by mouth daily. 270 capsule 1  ? Methylfol-Methylcob-Acetylcyst (CEREFOLIN NAC) 6-2-600 MG TABS Take by mouth.    ? mirabegron ER (MYRBETRIQ) 25 MG TB24 tablet Take 25 mg by mouth daily.    ? senna-docusate (SENOKOT-S) 8.6-50 MG tablet Take 2 tablets by mouth daily as needed for mild constipation. 30 tablet 0  ? sertraline (ZOLOFT) 100 MG tablet Take 2 tablets (200 mg total) by mouth daily. 180 tablet 1  ? simvastatin (ZOCOR) 20 MG tablet Take 1 tablet (20 mg total) by mouth daily. 90  tablet 3  ? triamcinolone cream (KENALOG) 0.1 % Apply 1 application topically 2 (two) times daily as needed. 30 g 2  ? Vitamin D, Ergocalciferol, (DRISDOL) 1.25 MG (50000 UNIT) CAPS capsule TAKE 1 CAPSULE EVERY 7 DAYS 12 capsule 1  ? ? ?No results found for this or any previous visit (from the past 48 hour(s)). ?DG Hip Unilat W or Wo Pelvis 2-3 Views Right ? ?Result Date: 03/11/2022 ?CLINICAL DATA:  Right hip pain after fall. EXAM: DG HIP (WITH OR WITHOUT PELVIS) 2-3V RIGHT COMPARISON:  None. FINDINGS: Acute minimally impacted right subcapital femoral neck fracture. No additional fracture. No dislocation. IMPRESSION: 1. Acute minimally impacted right subcapital femoral neck fracture. Electronically Signed   By: Titus Dubin M.D.   On: 03/11/2022 19:35   ? ?ROS: ?Unable to bear weight on right lower extremity fully ? ?Physical Exam: ?Alert and oriented 81 y/o male in no acute distress ?Cervical spine with no tenderness and full rom  ?Bilateral upper extremities show mild ecchymosis to left anterior forearm but no deformity. Full rom without tenderness or guarding ?Right hip/pelvis: tenderness with any movement of right leg, pelvis stable ?Small superficial abrasion to anterior right knee ?No signs of deformity ?Left lower extremity with full rom, no signs of trauma or deformity ?Nv intact distally bilaterally ? ?Assessment/Plan ?Assessment: right femoral neck fracture ? ?  Plan: ?Discussed case and x-rays with Dr. Lyla [name] and recommend right total hip arthroplasty tomorrow morning ?NPO after 11 tonight ?Pain management ?Bedrest ?Will monitor his progress overnight ?Discussed surgery and rehab with patient and family and they  are in agreement for surgery ? ?Jason Congetta Odriscoll PA-C, MPAS ?[facility] ?[address], [address], [place], [place] [address] ?Phone: [phone] ?[email or web address] ?Facebook  Engineer, structural  ? ? ?  ?

## 2022-03-11 NOTE — H&P (View-Only) (Signed)
? ?Jason [name] is an 81 y.o. male.   ? ?Chief Complaint:  right hip pain ? ?HPI: 81 y/o male with a ground level fall earlier today c/o moderate right hip pain. Pt was carrying food, slipped and fell onto right leg/hip. Able to limp to a vehicle and came to the emergency room. Denies any other injuries. Denies any syncope, chest pain or other cause for a fall. C/o mild pain to right anterior knee, but mostly pain to right hip. Denies any numbness or tingling distally. ? ?PCP:  [name], MD ? ?PMH: ?Past Medical History:  ?Diagnosis Date  ? Anxiety   ? Atrial fibrillation ([place])   ? past hx  ? Colon polyp   ? Depression   ? GERD (gastroesophageal reflux disease)   ? Gout, unspecified   ? Hypertension   ? Osteoarthrosis, unspecified whether generalized or localized, unspecified site   ? Pneumonia, organism unspecified(486)   ? Pure hypercholesterolemia   ? Routine general medical examination at a health care facility   ? Sleep apnea   ? not using cpap currently  ? Unspecified nonpsychotic mental disorder   ? ? ?PSes:  ?No Known Allergies ? ?Medications: ?No current facility-administered medications for this encounter.  ? ?Current Outpatient Medications  ?Medication Sig Dispense Refill  ? amLODipine (NORVASC) 5 MG tablet TAKE 1 TABLET BY MOUTH EVERY DAY 90 tablet 1  ? donepezil (ARICEPT) 10 MG tablet Take 1 tablet (10 mg total) by mouth at bedtime. 90 tablet 3  ? lithium carbonate 150 MG capsule Take 3 capsules (450 mg total) by mouth daily. 270 capsule 1  ? Methylfol-Methylcob-Acetylcyst (CEREFOLIN NAC) 6-2-600 MG TABS Take by mouth.    ? mirabegron ER (MYRBETRIQ) 25 MG TB24 tablet Take 25 mg by mouth daily.    ? senna-docusate (SENOKOT-S) 8.6-50 MG tablet Take 2 tablets by mouth daily as needed for mild constipation. 30 tablet 0  ? sertraline (ZOLOFT) 100 MG tablet Take 2 tablets (200 mg total) by mouth daily. 180 tablet 1  ? simvastatin (ZOCOR) 20 MG tablet Take 1 tablet (20 mg total) by mouth daily. 90  tablet 3  ? triamcinolone cream (KENALOG) 0.1 % Apply 1 application topically 2 (two) times daily as needed. 30 g 2  ? Vitamin D, Ergocalciferol, (DRISDOL) 1.25 MG (50000 UNIT) CAPS capsule TAKE 1 CAPSULE EVERY 7 DAYS 12 capsule 1  ? ? ?No results found for this or any previous visit (from the past 48 hour(s)). ?DG Hip Unilat W or Wo Pelvis 2-3 Views Right ? ?Result Date: 03/11/2022 ?CLINICAL DATA:  Right hip pain after fall. EXAM: DG HIP (WITH OR WITHOUT PELVIS) 2-3V RIGHT COMPARISON:  None. FINDINGS: Acute minimally impacted right subcapital femoral neck fracture. No additional fracture. No dislocation. IMPRESSION: 1. Acute minimally impacted right subcapital femoral neck fracture. Electronically Signed   By: Titus Dubin M.D.   On: 03/11/2022 19:35   ? ?ROS: ?Unable to bear weight on right lower extremity fully ? ?Physical Exam: ?Alert and oriented 81 y/o male in no acute distress ?Cervical spine with no tenderness and full rom  ?Bilateral upper extremities show mild ecchymosis to left anterior forearm but no deformity. Full rom without tenderness or guarding ?Right hip/pelvis: tenderness with any movement of right leg, pelvis stable ?Small superficial abrasion to anterior right knee ?No signs of deformity ?Left lower extremity with full rom, no signs of trauma or deformity ?Nv intact distally bilaterally ? ?Assessment/Plan ?Assessment: right femoral neck fracture ? ?  Plan: ?Discussed case and x-rays with Dr. Lyla Glassing and recommend right total hip arthroplasty tomorrow morning ?NPO after 11 tonight ?Pain management ?Bedrest ?Will monitor his progress overnight ?Discussed surgery and rehab with patient and family and they  are in agreement for surgery ? ?Brad Annita Ratliff PA-C, MPAS ?[place] [facility] [employer]  [facility] ?[address], [place], [place] [address] ?Phone: [phone] ?[email or web address] ?Facebook  Engineer, structural  ? ? ?  ?

## 2022-03-11 NOTE — ED Notes (Signed)
XR at bedside

## 2022-03-11 NOTE — ED Provider Notes (Signed)
?[facility] DEPT ?Provider Note ? ? ?CSN: [id number] ?Arrival date & time: 03/11/22  1734 ? ?  ? ?History ? ?Chief Complaint  ?Patient presents with  ? Fall  ? Hip Pain  ? ? ?Jason [name] is a 81 y.o. male who presents emergency department after a mechanical fall earlier today.  Patient states he was walking down the stairs with some groceries and tripped.  He states he landed on his right hip and right knee.  He is complaining of pain in the right hip, right knee, and right ankle.  There was no head trauma or loss of consciousness.  No other trauma noted.  He is not on chronic anticoagulation. ? ? ?Fall ?Pertinent negatives include no chest pain and no headaches.  ?Hip Pain ?Pertinent negatives include no chest pain and no headaches.  ? ?  ? ?Home Medications ?Prior to Admission medications   ?Medication Sig Start Date End Date Taking? Authorizing Provider  ?amLODipine (NORVASC) 5 MG tablet TAKE 1 TABLET BY MOUTH EVERY DAY 11/08/21  Yes Burchette, Alinda Sierras, MD  ?donepezil (ARICEPT) 10 MG tablet Take 1 tablet (10 mg total) by mouth at bedtime. 06/10/21  Yes Cottle, Billey Co., MD  ?lithium carbonate 150 MG capsule Take 3 capsules (450 mg total) by mouth daily. 12/09/21  Yes Cottle, Billey Co., MD  ?Multiple Vitamins-Minerals (MULTIVITAMIN ADULTS) TABS Take 1 tablet by mouth daily.   Yes [provider]  ?sertraline (ZOLOFT) 100 MG tablet Take 2 tablets (200 mg total) by mouth daily. ?Patient taking differently: Take 100 mg by mouth daily. 12/09/21  Yes Cottle, Billey Co., MD  ?simvastatin (ZOCOR) 20 MG tablet Take 1 tablet (20 mg total) by mouth daily. 02/02/22  Yes Burchette, Alinda Sierras, MD  ?triamcinolone cream (KENALOG) 0.1 % Apply 1 application topically 2 (two) times daily as needed. ?Patient taking differently: Apply 1 application. topically 2 (two) times daily as needed (itching). 02/02/22  Yes Burchette, Alinda Sierras, MD  ?Vitamin D, Ergocalciferol, (DRISDOL) 1.25 MG (50000 UNIT)  CAPS capsule TAKE 1 CAPSULE EVERY 7 DAYS 12/28/21  Yes Burchette, Alinda Sierras, MD  ?senna-docusate (SENOKOT-S) 8.6-50 MG tablet Take 2 tablets by mouth daily as needed for mild constipation. ?Patient not taking: Reported on 03/11/2022 04/19/20   Petrucelli, Glynda Jaeger, PA-C  ?   ? ?Allergies    ?Patient has no known allergies.   ? ?Review of Systems   ?Review of Systems  ?Cardiovascular:  Negative for chest pain and palpitations.  ?Musculoskeletal:   ?     Right hip and knee pain  ?Neurological:  Negative for weakness, light-headedness, numbness and headaches.  ?All other systems reviewed and are negative. ? ?Physical Exam ?Updated Vital Signs ?BP (!) 145/86   Pulse 69   Temp 98.7 ?F (37.1 ?C) (Oral)   Resp 18   SpO2 100%  ?Physical Exam ?Vitals and nursing note reviewed.  ?Constitutional:   ?   Appearance: Normal appearance.  ?HENT:  ?   Head: Normocephalic and atraumatic.  ?Eyes:  ?   Conjunctiva/sclera: Conjunctivae normal.  ?Neck:  ?   Comments: No cervical midline spinal tenderness, step-offs or crepitus. ?Cardiovascular:  ?   Pulses:     ?     Posterior tibial pulses are 2+ on the right side and 2+ on the left side.  ?Pulmonary:  ?   Effort: Pulmonary effort is normal. No respiratory distress.  ?Musculoskeletal:  ?   Comments: Some generalized tenderness over  posterior right hip to palpation and decreased passive ROM due to pain. No palpable deformities to the right lower extremity. Normal ROM and strength of right knee and ankle. Sensation intact bilaterally.   ?Skin: ?   General: Skin is warm and dry.  ?Neurological:  ?   Mental Status: He is alert.  ?Psychiatric:     ?   Mood and Affect: Mood normal.     ?   Behavior: Behavior normal.  ? ? ?ED Results / Procedures / Treatments   ?Labs ?(all labs ordered are listed, but only abnormal results are displayed) ?Labs Reviewed  ?CBC WITH DIFFERENTIAL/PLATELET - Abnormal; Notable for the following components:  ?    Result Value  ? WBC 10.6 (*)   ? RBC 4.09 (*)   ?  Platelets 147 (*)   ? Neutro Abs 9.2 (*)   ? All other components within normal limits  ?BASIC METABOLIC PANEL - Abnormal; Notable for the following components:  ? CO2 21 (*)   ? Glucose, Bld 123 (*)   ? All other components within normal limits  ? ? ?EKG ?None ? ?Radiology ?CT HIP RIGHT WO CONTRAST ? ?Result Date: 03/11/2022 ?CLINICAL DATA:  Right hip fracture. EXAM: CT OF THE RIGHT HIP WITHOUT CONTRAST TECHNIQUE: Multidetector CT imaging of the right hip was performed according to the standard protocol. Multiplanar CT image reconstructions were also generated. RADIATION DOSE REDUCTION: This exam was performed according to the departmental dose-optimization program which includes automated exposure control, adjustment of the mA and/or kV according to patient size and/or use of iterative reconstruction technique. COMPARISON:  Right hip x-rays from same day. FINDINGS: Bones/Joint/Cartilage Acute minimally impacted right subcapital femoral neck fracture again noted. No additional fracture. No dislocation. Mild-to-moderate right hip joint space narrowing with marginal osteophytes and subchondral cysts. Faint chondrocalcinosis. Degenerative changes of the pubic symphysis. Osteopenia. Small right hip joint effusion. Ligaments Ligaments are suboptimally evaluated by CT. Muscles and Tendons Grossly intact. Soft tissue No fluid collection or hematoma.  No soft tissue mass. IMPRESSION: 1. Acute minimally impacted right subcapital femoral neck fracture. 2. Mild-to-moderate right hip osteoarthritis. Electronically Signed   By: Titus Dubin M.D.   On: 03/11/2022 21:19  ? ?DG Knee Right Port ? ?Result Date: 03/11/2022 ?CLINICAL DATA:  Right hip fracture.  Evaluate the right knee. EXAM: PORTABLE RIGHT KNEE - 1-2 VIEW COMPARISON:  None. FINDINGS: No acute fracture or dislocation. No joint effusion. Small patellar marginal osteophytes. Joint spaces are relatively preserved. Chondrocalcinosis of the menisci. Bone mineralization is  normal. Soft tissues are unremarkable. IMPRESSION: 1. No acute osseous abnormality. Electronically Signed   By: Titus Dubin M.D.   On: 03/11/2022 21:13  ? ?DG Hip Unilat W or Wo Pelvis 2-3 Views Right ? ?Result Date: 03/11/2022 ?CLINICAL DATA:  Right hip pain after fall. EXAM: DG HIP (WITH OR WITHOUT PELVIS) 2-3V RIGHT COMPARISON:  None. FINDINGS: Acute minimally impacted right subcapital femoral neck fracture. No additional fracture. No dislocation. IMPRESSION: 1. Acute minimally impacted right subcapital femoral neck fracture. Electronically Signed   By: Titus Dubin M.D.   On: 03/11/2022 19:35   ? ?Procedures ?Procedures  ? ? ?Medications Ordered in ED ?Medications  ?fentaNYL (SUBLIMAZE) injection 50 mcg (50 mcg Intravenous Given 03/11/22 2159)  ? ? ?ED Course/ Medical Decision Making/ A&P ?  ?                        ?Medical Decision Making ?Amount and/or Complexity  of Data Reviewed ?Labs: ordered. ?Radiology: ordered. ? ? ?This patient is a 81 year old male who presents to the ED for concern of right hip pain after mechanical fall.  ? ?Past Medical History / Co-morbidities: ?Hyperlipidemia, atrial fibrillation, hypertension, depression ? ?Physical Exam: ?Physical exam performed. The pertinent findings include: Tenderness in the right hip/pelvis with any movement of the right leg.  Stable pelvis. ? ?Lab Tests/Imaging studies: ?I Ordered, and personally interpreted labs/imaging including right hip x-ray, right knee x-ray, and right hip CT.  The pertinent results include: Acute minimally impacted right subcapital femoral neck fracture. No acute fractures of the right knee. I agree with the radiologist interpretation. ?  ?Consultations: ?I obtained consultation with Merla Riches PA-C with orthopedic surgery who came and evaluated the patient. He recommended medical admission and they plan to take him to the OR tomorrow morning for surgery with Dr. Lyla Glassing.  ? ?My attending physician Dr. Vanita Panda will consult  for admission.  ?  ?Disposition: ?After consideration of the diagnostic results and the patients response to treatment, I feel that patient is requiring admission. Will obtain pre-op labs. The patient appears re

## 2022-03-11 NOTE — ED Notes (Signed)
Ortho at bedside.

## 2022-03-11 NOTE — ED Notes (Signed)
Patient transported to CT 

## 2022-03-11 NOTE — ED Triage Notes (Signed)
Per EMS- Patient reports that he was walking up the stairs with groceries and tripped, landing on his right hip. No swelling, deformity, or bruising noted, Patient denies blood thinners. Alert and oriented x 4. ?

## 2022-03-11 NOTE — ED Notes (Signed)
Patient transported to X-ray 

## 2022-03-12 ENCOUNTER — Inpatient Hospital Stay (HOSPITAL_COMMUNITY): Payer: Medicare Other

## 2022-03-12 ENCOUNTER — Inpatient Hospital Stay (HOSPITAL_COMMUNITY): Payer: Medicare Other | Admitting: Anesthesiology

## 2022-03-12 ENCOUNTER — Other Ambulatory Visit: Payer: Self-pay

## 2022-03-12 ENCOUNTER — Encounter (HOSPITAL_COMMUNITY)
Admission: EM | Disposition: A | Discharge status: Skilled Nursing Facility | Payer: Self-pay | Source: Home / Self Care | Attending: Student

## 2022-03-12 ENCOUNTER — Encounter (HOSPITAL_COMMUNITY): Payer: Self-pay | Admitting: Internal Medicine

## 2022-03-12 DIAGNOSIS — E7800 Pure hypercholesterolemia, unspecified: Secondary | ICD-10-CM | POA: Diagnosis not present

## 2022-03-12 DIAGNOSIS — F0393 Unspecified dementia, unspecified severity, with mood disturbance: Secondary | ICD-10-CM | POA: Diagnosis not present

## 2022-03-12 DIAGNOSIS — R2681 Unsteadiness on feet: Secondary | ICD-10-CM | POA: Diagnosis not present

## 2022-03-12 DIAGNOSIS — R531 Weakness: Secondary | ICD-10-CM | POA: Diagnosis not present

## 2022-03-12 DIAGNOSIS — M1990 Unspecified osteoarthritis, unspecified site: Secondary | ICD-10-CM

## 2022-03-12 DIAGNOSIS — S72009A Fracture of unspecified part of neck of unspecified femur, initial encounter for closed fracture: Secondary | ICD-10-CM | POA: Diagnosis present

## 2022-03-12 DIAGNOSIS — R41841 Cognitive communication deficit: Secondary | ICD-10-CM | POA: Diagnosis not present

## 2022-03-12 DIAGNOSIS — S72001A Fracture of unspecified part of neck of right femur, initial encounter for closed fracture: Secondary | ICD-10-CM

## 2022-03-12 DIAGNOSIS — F411 Generalized anxiety disorder: Secondary | ICD-10-CM | POA: Diagnosis not present

## 2022-03-12 DIAGNOSIS — J302 Other seasonal allergic rhinitis: Secondary | ICD-10-CM | POA: Diagnosis not present

## 2022-03-12 DIAGNOSIS — Z7401 Bed confinement status: Secondary | ICD-10-CM | POA: Diagnosis not present

## 2022-03-12 DIAGNOSIS — G4733 Obstructive sleep apnea (adult) (pediatric): Secondary | ICD-10-CM

## 2022-03-12 DIAGNOSIS — R2689 Other abnormalities of gait and mobility: Secondary | ICD-10-CM | POA: Diagnosis not present

## 2022-03-12 DIAGNOSIS — I10 Essential (primary) hypertension: Secondary | ICD-10-CM

## 2022-03-12 DIAGNOSIS — D62 Acute posthemorrhagic anemia: Secondary | ICD-10-CM | POA: Diagnosis not present

## 2022-03-12 DIAGNOSIS — K219 Gastro-esophageal reflux disease without esophagitis: Secondary | ICD-10-CM | POA: Diagnosis present

## 2022-03-12 DIAGNOSIS — Z471 Aftercare following joint replacement surgery: Secondary | ICD-10-CM | POA: Diagnosis not present

## 2022-03-12 DIAGNOSIS — I4891 Unspecified atrial fibrillation: Secondary | ICD-10-CM | POA: Diagnosis not present

## 2022-03-12 DIAGNOSIS — I440 Atrioventricular block, first degree: Secondary | ICD-10-CM | POA: Diagnosis not present

## 2022-03-12 DIAGNOSIS — F39 Unspecified mood [affective] disorder: Secondary | ICD-10-CM | POA: Diagnosis not present

## 2022-03-12 DIAGNOSIS — R4189 Other symptoms and signs involving cognitive functions and awareness: Secondary | ICD-10-CM | POA: Diagnosis not present

## 2022-03-12 DIAGNOSIS — M25751 Osteophyte, right hip: Secondary | ICD-10-CM | POA: Diagnosis not present

## 2022-03-12 DIAGNOSIS — E559 Vitamin D deficiency, unspecified: Secondary | ICD-10-CM | POA: Diagnosis not present

## 2022-03-12 DIAGNOSIS — W109XXA Fall (on) (from) unspecified stairs and steps, initial encounter: Secondary | ICD-10-CM | POA: Diagnosis present

## 2022-03-12 DIAGNOSIS — Z8679 Personal history of other diseases of the circulatory system: Secondary | ICD-10-CM | POA: Diagnosis not present

## 2022-03-12 DIAGNOSIS — M6281 Muscle weakness (generalized): Secondary | ICD-10-CM | POA: Diagnosis not present

## 2022-03-12 DIAGNOSIS — D649 Anemia, unspecified: Secondary | ICD-10-CM | POA: Diagnosis not present

## 2022-03-12 DIAGNOSIS — S72011A Unspecified intracapsular fracture of right femur, initial encounter for closed fracture: Secondary | ICD-10-CM | POA: Diagnosis not present

## 2022-03-12 DIAGNOSIS — R079 Chest pain, unspecified: Secondary | ICD-10-CM | POA: Diagnosis not present

## 2022-03-12 DIAGNOSIS — S72041A Displaced fracture of base of neck of right femur, initial encounter for closed fracture: Secondary | ICD-10-CM | POA: Diagnosis not present

## 2022-03-12 DIAGNOSIS — S72091D Other fracture of head and neck of right femur, subsequent encounter for closed fracture with routine healing: Secondary | ICD-10-CM | POA: Diagnosis not present

## 2022-03-12 DIAGNOSIS — D696 Thrombocytopenia, unspecified: Secondary | ICD-10-CM | POA: Diagnosis not present

## 2022-03-12 DIAGNOSIS — W19XXXA Unspecified fall, initial encounter: Secondary | ICD-10-CM

## 2022-03-12 DIAGNOSIS — M159 Polyosteoarthritis, unspecified: Secondary | ICD-10-CM | POA: Diagnosis not present

## 2022-03-12 DIAGNOSIS — Z96641 Presence of right artificial hip joint: Secondary | ICD-10-CM | POA: Diagnosis not present

## 2022-03-12 DIAGNOSIS — Z9181 History of falling: Secondary | ICD-10-CM | POA: Diagnosis not present

## 2022-03-12 DIAGNOSIS — Z79899 Other long term (current) drug therapy: Secondary | ICD-10-CM | POA: Diagnosis not present

## 2022-03-12 HISTORY — PX: TOTAL HIP ARTHROPLASTY: SHX124

## 2022-03-12 LAB — SURGICAL PCR SCREEN
MRSA, PCR: NEGATIVE
Staphylococcus aureus: NEGATIVE

## 2022-03-12 LAB — BASIC METABOLIC PANEL
Anion gap: 8 (ref 5–15)
BUN: 19 mg/dL (ref 8–23)
CO2: 24 mmol/L (ref 22–32)
Calcium: 9.4 mg/dL (ref 8.9–10.3)
Chloride: 105 mmol/L (ref 98–111)
Creatinine, Ser: 0.99 mg/dL (ref 0.61–1.24)
GFR, Estimated: >60 mL/min (ref >60)
Glucose, Bld: 122 mg/dL — ABNORMAL HIGH (ref 70–99)
Potassium: 4 mmol/L (ref 3.5–5.1)
Sodium: 137 mmol/L (ref 135–145)

## 2022-03-12 LAB — HEMOGLOBIN A1C
Hgb A1c MFr Bld: 5.3 % (ref 4.8–5.6)
Mean Plasma Glucose: 105.41 mg/dL

## 2022-03-12 LAB — URINALYSIS, ROUTINE W REFLEX MICROSCOPIC
Bacteria, UA: NONE SEEN
Bilirubin Urine: NEGATIVE
Glucose, UA: NEGATIVE mg/dL
Ketones, ur: 5 mg/dL — AB
Leukocytes,Ua: NEGATIVE
Nitrite: NEGATIVE
Protein, ur: NEGATIVE mg/dL
Specific Gravity, Urine: 1.019 (ref 1.005–1.030)
pH: 5 (ref 5.0–8.0)

## 2022-03-12 LAB — CBC
HCT: 36.1 % — ABNORMAL LOW (ref 39.0–52.0)
Hemoglobin: 12 g/dL — ABNORMAL LOW (ref 13.0–17.0)
MCH: 32.4 pg (ref 26.0–34.0)
MCHC: 33.2 g/dL (ref 30.0–36.0)
MCV: 97.6 fL (ref 80.0–100.0)
Platelets: 142 10*3/uL — ABNORMAL LOW (ref 150–400)
RBC: 3.7 MIL/uL — ABNORMAL LOW (ref 4.22–5.81)
RDW: 12.6 % (ref 11.5–15.5)
WBC: 8.9 10*3/uL (ref 4.0–10.5)
nRBC: 0 % (ref 0.0–0.2)

## 2022-03-12 LAB — TYPE AND SCREEN
ABO/RH(D): O NEG
Antibody Screen: NEGATIVE

## 2022-03-12 LAB — GLUCOSE, CAPILLARY
Glucose-Capillary: 114 mg/dL — ABNORMAL HIGH (ref 70–99)
Glucose-Capillary: 125 mg/dL — ABNORMAL HIGH (ref 70–99)
Glucose-Capillary: 135 mg/dL — ABNORMAL HIGH (ref 70–99)
Glucose-Capillary: 139 mg/dL — ABNORMAL HIGH (ref 70–99)
Glucose-Capillary: 98 mg/dL (ref 70–99)

## 2022-03-12 LAB — ABO/RH: ABO/RH(D): O NEG

## 2022-03-12 SURGERY — ARTHROPLASTY, HIP, TOTAL, ANTERIOR APPROACH
Anesthesia: Spinal | Site: Hip | Laterality: Right

## 2022-03-12 MED ORDER — DIPHENHYDRAMINE HCL 12.5 MG/5ML PO ELIX: 12.5000 mg | ORAL_SOLUTION | ORAL | Status: DC | PRN

## 2022-03-12 MED ORDER — SODIUM CHLORIDE 0.9 % IV SOLN: INTRAVENOUS | Status: DC

## 2022-03-12 MED ORDER — LITHIUM CARBONATE 300 MG PO CAPS
450.0000 mg | ORAL_CAPSULE | Freq: Every day | ORAL | Status: DC
  Administered 2022-03-12 – 2022-03-15 (×4): 450 mg via ORAL
  Filled 2022-03-12 (×4): qty 1

## 2022-03-12 MED ORDER — METHOCARBAMOL 1000 MG/10ML IJ SOLN
500.0000 mg | Freq: Four times a day (QID) | INTRAVENOUS | Status: DC | PRN
  Filled 2022-03-12: qty 5

## 2022-03-12 MED ORDER — ISOPROPYL ALCOHOL 70 % SOLN
Status: AC
  Filled 2022-03-12: qty 480

## 2022-03-12 MED ORDER — METHOCARBAMOL 500 MG PO TABS: 500.0000 mg | ORAL_TABLET | Freq: Four times a day (QID) | ORAL | Status: DC | PRN

## 2022-03-12 MED ORDER — SODIUM CHLORIDE 0.9 % IR SOLN
Status: DC | PRN
  Administered 2022-03-12: 3000 mL
  Administered 2022-03-12: 1000 mL

## 2022-03-12 MED ORDER — CEFAZOLIN SODIUM-DEXTROSE 2-4 GM/100ML-% IV SOLN
INTRAVENOUS | Status: AC
  Filled 2022-03-12: qty 100

## 2022-03-12 MED ORDER — DEXAMETHASONE SODIUM PHOSPHATE 10 MG/ML IJ SOLN
10.0000 mg | Freq: Once | INTRAMUSCULAR | Status: AC
  Administered 2022-03-13: 10 mg via INTRAVENOUS
  Filled 2022-03-12: qty 1

## 2022-03-12 MED ORDER — DOCUSATE SODIUM 100 MG PO CAPS
100.0000 mg | ORAL_CAPSULE | Freq: Two times a day (BID) | ORAL | Status: DC
  Administered 2022-03-12 – 2022-03-15 (×5): 100 mg via ORAL
  Filled 2022-03-12 (×6): qty 1

## 2022-03-12 MED ORDER — ONDANSETRON HCL 4 MG/2ML IJ SOLN
INTRAMUSCULAR | Status: DC | PRN
  Administered 2022-03-12: 4 mg via INTRAVENOUS

## 2022-03-12 MED ORDER — LACTATED RINGERS IV SOLN: INTRAVENOUS | Status: DC | PRN

## 2022-03-12 MED ORDER — MORPHINE SULFATE (PF) 2 MG/ML IV SOLN
0.5000 mg | INTRAVENOUS | Status: DC | PRN
  Administered 2022-03-13: 0.5 mg via INTRAVENOUS
  Administered 2022-03-13 (×2): 1 mg via INTRAVENOUS
  Filled 2022-03-12 (×3): qty 1

## 2022-03-12 MED ORDER — BUPIVACAINE-EPINEPHRINE (PF) 0.25% -1:200000 IJ SOLN
INTRAMUSCULAR | Status: AC
  Filled 2022-03-12: qty 30

## 2022-03-12 MED ORDER — ACETAMINOPHEN 325 MG PO TABS
325.0000 mg | ORAL_TABLET | Freq: Four times a day (QID) | ORAL | Status: DC | PRN
  Administered 2022-03-12 – 2022-03-13 (×2): 650 mg via ORAL
  Filled 2022-03-12: qty 2

## 2022-03-12 MED ORDER — ENSURE SURGERY PO LIQD
237.0000 mL | Freq: Two times a day (BID) | ORAL | Status: DC
  Filled 2022-03-12 (×7): qty 237

## 2022-03-12 MED ORDER — ASPIRIN 81 MG PO CHEW
81.0000 mg | CHEWABLE_TABLET | Freq: Two times a day (BID) | ORAL | Status: DC
  Administered 2022-03-12 – 2022-03-15 (×6): 81 mg via ORAL
  Filled 2022-03-12 (×6): qty 1

## 2022-03-12 MED ORDER — PHENOL 1.4 % MT LIQD: 1.0000 | OROMUCOSAL | Status: DC | PRN

## 2022-03-12 MED ORDER — SENNA 8.6 MG PO TABS
1.0000 | ORAL_TABLET | Freq: Two times a day (BID) | ORAL | Status: DC
  Administered 2022-03-13 – 2022-03-15 (×5): 8.6 mg via ORAL
  Filled 2022-03-12 (×6): qty 1

## 2022-03-12 MED ORDER — HYDROCODONE-ACETAMINOPHEN 7.5-325 MG PO TABS: 1.0000 | ORAL_TABLET | ORAL | Status: DC | PRN

## 2022-03-12 MED ORDER — PROPOFOL 10 MG/ML IV BOLUS
INTRAVENOUS | Status: AC
Start: 2022-03-12 — End: ?
  Filled 2022-03-12: qty 20

## 2022-03-12 MED ORDER — OXYCODONE HCL 5 MG PO TABS: 5.0000 mg | ORAL_TABLET | Freq: Once | ORAL | Status: DC | PRN

## 2022-03-12 MED ORDER — FENTANYL CITRATE PF 50 MCG/ML IJ SOSY: 25.0000 ug | PREFILLED_SYRINGE | INTRAMUSCULAR | Status: DC | PRN

## 2022-03-12 MED ORDER — ISOPROPYL ALCOHOL 70 % SOLN
Status: DC | PRN
  Administered 2022-03-12: 1 via TOPICAL

## 2022-03-12 MED ORDER — TRANEXAMIC ACID-NACL 1000-0.7 MG/100ML-% IV SOLN
INTRAVENOUS | Status: AC
  Filled 2022-03-12: qty 100

## 2022-03-12 MED ORDER — KETOROLAC TROMETHAMINE 30 MG/ML IJ SOLN
INTRAMUSCULAR | Status: AC
  Filled 2022-03-12: qty 1

## 2022-03-12 MED ORDER — BUPIVACAINE IN DEXTROSE 0.75-8.25 % IT SOLN
INTRATHECAL | Status: DC | PRN
  Administered 2022-03-12: 15 mg via INTRATHECAL

## 2022-03-12 MED ORDER — MIDAZOLAM HCL 2 MG/2ML IJ SOLN: 0.5000 mg | Freq: Once | INTRAMUSCULAR | Status: DC | PRN

## 2022-03-12 MED ORDER — METOCLOPRAMIDE HCL 5 MG PO TABS: 5.0000 mg | ORAL_TABLET | Freq: Three times a day (TID) | ORAL | Status: DC | PRN

## 2022-03-12 MED ORDER — ONDANSETRON HCL 4 MG/2ML IJ SOLN
4.0000 mg | Freq: Four times a day (QID) | INTRAMUSCULAR | Status: DC | PRN
  Administered 2022-03-14: 4 mg via INTRAVENOUS
  Filled 2022-03-12: qty 2

## 2022-03-12 MED ORDER — ACETAMINOPHEN 500 MG PO TABS
ORAL_TABLET | ORAL | Status: AC
  Filled 2022-03-12: qty 2

## 2022-03-12 MED ORDER — ALUM & MAG HYDROXIDE-SIMETH 200-200-20 MG/5ML PO SUSP: 30.0000 mL | ORAL | Status: DC | PRN

## 2022-03-12 MED ORDER — FENTANYL CITRATE (PF) 250 MCG/5ML IJ SOLN
INTRAMUSCULAR | Status: AC
  Filled 2022-03-12: qty 5

## 2022-03-12 MED ORDER — MORPHINE SULFATE (PF) 2 MG/ML IV SOLN: 0.5000 mg | INTRAVENOUS | Status: DC | PRN

## 2022-03-12 MED ORDER — ACETAMINOPHEN 500 MG PO TABS: 1000.0000 mg | ORAL_TABLET | Freq: Once | ORAL | Status: DC

## 2022-03-12 MED ORDER — SIMVASTATIN 20 MG PO TABS
20.0000 mg | ORAL_TABLET | Freq: Every day | ORAL | Status: DC
  Administered 2022-03-12 – 2022-03-15 (×4): 20 mg via ORAL
  Filled 2022-03-12 (×4): qty 1

## 2022-03-12 MED ORDER — MENTHOL 3 MG MT LOZG: 1.0000 | LOZENGE | OROMUCOSAL | Status: DC | PRN

## 2022-03-12 MED ORDER — HYDROCODONE-ACETAMINOPHEN 5-325 MG PO TABS
1.0000 | ORAL_TABLET | ORAL | Status: DC | PRN
  Administered 2022-03-13 – 2022-03-14 (×3): 1 via ORAL
  Filled 2022-03-12 (×4): qty 1

## 2022-03-12 MED ORDER — POVIDONE-IODINE 10 % EX SWAB: 2.0000 "application " | Freq: Once | CUTANEOUS | Status: DC

## 2022-03-12 MED ORDER — POLYETHYLENE GLYCOL 3350 17 G PO PACK: 17.0000 g | PACK | Freq: Every day | ORAL | Status: DC | PRN

## 2022-03-12 MED ORDER — ONDANSETRON HCL 4 MG PO TABS: 4.0000 mg | ORAL_TABLET | Freq: Four times a day (QID) | ORAL | Status: DC | PRN

## 2022-03-12 MED ORDER — SENNOSIDES-DOCUSATE SODIUM 8.6-50 MG PO TABS: 1.0000 | ORAL_TABLET | Freq: Every evening | ORAL | Status: DC | PRN

## 2022-03-12 MED ORDER — SODIUM CHLORIDE (PF) 0.9 % IJ SOLN
INTRAMUSCULAR | Status: DC | PRN
  Administered 2022-03-12: 30 mL

## 2022-03-12 MED ORDER — AMLODIPINE BESYLATE 5 MG PO TABS: 5.0000 mg | ORAL_TABLET | Freq: Every day | ORAL | Status: DC

## 2022-03-12 MED ORDER — CEFAZOLIN SODIUM-DEXTROSE 2-4 GM/100ML-% IV SOLN
2.0000 g | INTRAVENOUS | Status: AC
  Administered 2022-03-12: 2 g via INTRAVENOUS

## 2022-03-12 MED ORDER — SODIUM CHLORIDE (PF) 0.9 % IJ SOLN
INTRAMUSCULAR | Status: AC
  Filled 2022-03-12: qty 50

## 2022-03-12 MED ORDER — PHENYLEPHRINE HCL (PRESSORS) 10 MG/ML IV SOLN
INTRAVENOUS | Status: DC | PRN
  Administered 2022-03-12: 80 ug via INTRAVENOUS

## 2022-03-12 MED ORDER — BUPIVACAINE-EPINEPHRINE 0.25% -1:200000 IJ SOLN
INTRAMUSCULAR | Status: DC | PRN
  Administered 2022-03-12: 30 mL

## 2022-03-12 MED ORDER — ADULT MULTIVITAMIN W/MINERALS CH
1.0000 | ORAL_TABLET | Freq: Every day | ORAL | Status: DC
  Administered 2022-03-12 – 2022-03-15 (×4): 1 via ORAL
  Filled 2022-03-12 (×4): qty 1

## 2022-03-12 MED ORDER — ACETAMINOPHEN 500 MG PO TABS
1000.0000 mg | ORAL_TABLET | Freq: Once | ORAL | Status: AC
  Administered 2022-03-12: 1000 mg via ORAL

## 2022-03-12 MED ORDER — OXYCODONE HCL 5 MG/5ML PO SOLN: 5.0000 mg | Freq: Once | ORAL | Status: DC | PRN

## 2022-03-12 MED ORDER — HYDROCODONE-ACETAMINOPHEN 5-325 MG PO TABS: 1.0000 | ORAL_TABLET | Freq: Four times a day (QID) | ORAL | Status: DC | PRN

## 2022-03-12 MED ORDER — DONEPEZIL HCL 10 MG PO TABS
10.0000 mg | ORAL_TABLET | Freq: Every day | ORAL | Status: DC
  Administered 2022-03-12 – 2022-03-14 (×3): 10 mg via ORAL
  Filled 2022-03-12 (×3): qty 1

## 2022-03-12 MED ORDER — ACETAMINOPHEN 325 MG PO TABS
325.0000 mg | ORAL_TABLET | Freq: Four times a day (QID) | ORAL | Status: DC | PRN
  Filled 2022-03-12: qty 2

## 2022-03-12 MED ORDER — CEFAZOLIN SODIUM-DEXTROSE 2-4 GM/100ML-% IV SOLN
2.0000 g | Freq: Four times a day (QID) | INTRAVENOUS | Status: AC
  Administered 2022-03-12 (×2): 2 g via INTRAVENOUS
  Filled 2022-03-12 (×2): qty 100

## 2022-03-12 MED ORDER — METOCLOPRAMIDE HCL 5 MG/ML IJ SOLN: 5.0000 mg | Freq: Three times a day (TID) | INTRAMUSCULAR | Status: DC | PRN

## 2022-03-12 MED ORDER — PROPOFOL 500 MG/50ML IV EMUL
INTRAVENOUS | Status: DC | PRN
  Administered 2022-03-12: 50 ug/kg/min via INTRAVENOUS

## 2022-03-12 MED ORDER — WATER FOR IRRIGATION, STERILE IR SOLN
Status: DC | PRN
  Administered 2022-03-12: 2000 mL

## 2022-03-12 MED ORDER — SERTRALINE HCL 100 MG PO TABS
100.0000 mg | ORAL_TABLET | Freq: Every day | ORAL | Status: DC
  Administered 2022-03-12 – 2022-03-15 (×4): 100 mg via ORAL
  Filled 2022-03-12 (×4): qty 1

## 2022-03-12 MED ORDER — TRANEXAMIC ACID-NACL 1000-0.7 MG/100ML-% IV SOLN
1000.0000 mg | INTRAVENOUS | Status: AC
  Administered 2022-03-12: 1000 mg via INTRAVENOUS

## 2022-03-12 MED ORDER — CHLORHEXIDINE GLUCONATE 4 % EX LIQD: 60.0000 mL | Freq: Once | CUTANEOUS | Status: DC

## 2022-03-12 MED ORDER — TRANEXAMIC ACID-NACL 1000-0.7 MG/100ML-% IV SOLN
1000.0000 mg | Freq: Once | INTRAVENOUS | Status: AC
  Administered 2022-03-12: 1000 mg via INTRAVENOUS
  Filled 2022-03-12: qty 100

## 2022-03-12 MED ORDER — FENTANYL CITRATE (PF) 100 MCG/2ML IJ SOLN
INTRAMUSCULAR | Status: DC | PRN
  Administered 2022-03-12: 50 ug via INTRAVENOUS

## 2022-03-12 MED ORDER — KETOROLAC TROMETHAMINE 30 MG/ML IJ SOLN
INTRAMUSCULAR | Status: DC | PRN
  Administered 2022-03-12: 30 mg

## 2022-03-12 SURGICAL SUPPLY — 62 items
ADH SKN CLS APL DERMABOND .7 (GAUZE/BANDAGES/DRESSINGS) ×2
APL PRP STRL LF DISP 70% ISPRP (MISCELLANEOUS) ×1
BAG COUNTER SPONGE SURGICOUNT (BAG) IMPLANT
BAG DECANTER FOR FLEXI CONT (MISCELLANEOUS) IMPLANT
BAG SPEC THK2 15X12 ZIP CLS (MISCELLANEOUS)
BAG SPNG CNTER NS LX DISP (BAG)
BAG ZIPLOCK 12X15 (MISCELLANEOUS) IMPLANT
CHLORAPREP W/TINT 26 (MISCELLANEOUS) ×3 IMPLANT
COVER PERINEAL POST (MISCELLANEOUS) ×3 IMPLANT
COVER SURGICAL LIGHT HANDLE (MISCELLANEOUS) ×3 IMPLANT
DERMABOND ADVANCED (GAUZE/BANDAGES/DRESSINGS) ×2
DERMABOND ADVANCED .7 DNX12 (GAUZE/BANDAGES/DRESSINGS) ×4 IMPLANT
DRAPE IMP U-DRAPE 54X76 (DRAPES) ×3 IMPLANT
DRAPE SHEET LG 3/4 BI-LAMINATE (DRAPES) ×9 IMPLANT
DRAPE STERI IOBAN 125X83 (DRAPES) ×3 IMPLANT
DRAPE U-SHAPE 47X51 STRL (DRAPES) ×6 IMPLANT
DRSG AQUACEL AG ADV 3.5X10 (GAUZE/BANDAGES/DRESSINGS) ×3 IMPLANT
ELECT REM PT RETURN 15FT ADLT (MISCELLANEOUS) ×3 IMPLANT
GAUZE SPONGE 4X4 12PLY STRL (GAUZE/BANDAGES/DRESSINGS) ×3 IMPLANT
GLOVE SRG 8 PF TXTR STRL LF DI (GLOVE) ×2 IMPLANT
GLOVE SURG ENC MOIS LTX SZ8.5 (GLOVE) ×6 IMPLANT
GLOVE SURG ENC TEXT LTX SZ7.5 (GLOVE) ×6 IMPLANT
GLOVE SURG UNDER POLY LF SZ8 (GLOVE) ×2
GLOVE SURG UNDER POLY LF SZ8.5 (GLOVE) ×3 IMPLANT
GOWN SPEC L3 XXLG W/TWL (GOWN DISPOSABLE) ×6 IMPLANT
HANDPIECE INTERPULSE COAX TIP (DISPOSABLE) ×2
HD FEM MOD 36M STD (Miscellaneous) ×2 IMPLANT
HEAD FEM -3XOFST 36XMDLR (Head) IMPLANT
HEAD FEM MOD 36M STD (Miscellaneous) IMPLANT
HEAD MODULAR 36MM (Head) IMPLANT
HOLDER FOLEY CATH W/STRAP (MISCELLANEOUS) ×3 IMPLANT
HOOD PEEL AWAY FLYTE STAYCOOL (MISCELLANEOUS) ×9 IMPLANT
KIT TURNOVER KIT A (KITS) IMPLANT
LINER ACETAB G7 NEUTRAL H 36 (Liner) ×1 IMPLANT
MANIFOLD NEPTUNE II (INSTRUMENTS) ×3 IMPLANT
MARKER SKIN DUAL TIP RULER LAB (MISCELLANEOUS) ×3 IMPLANT
NDL SAFETY ECLIPSE 18X1.5 (NEEDLE) ×2 IMPLANT
NDL SPNL 18GX3.5 QUINCKE PK (NEEDLE) ×2 IMPLANT
NEEDLE HYPO 18GX1.5 SHARP (NEEDLE) ×2
NEEDLE SPNL 18GX3.5 QUINCKE PK (NEEDLE) ×2 IMPLANT
PACK ANTERIOR HIP CUSTOM (KITS) ×3 IMPLANT
PENCIL SMOKE EVACUATOR (MISCELLANEOUS) IMPLANT
SAW OSC TIP CART 19.5X105X1.3 (SAW) ×3 IMPLANT
SEALER BIPOLAR AQUA 6.0 (INSTRUMENTS) ×3 IMPLANT
SET HNDPC FAN SPRY TIP SCT (DISPOSABLE) ×2 IMPLANT
SHELL ACET G7 4H 62 SZH (Shell) ×1 IMPLANT
SOLUTION PRONTOSAN WOUND 350ML (IRRIGATION / IRRIGATOR) ×3 IMPLANT
SPIKE FLUID TRANSFER (MISCELLANEOUS) ×3 IMPLANT
SPONGE T-LAP 18X18 ~~LOC~~+RFID (SPONGE) ×9 IMPLANT
STAPLER INSORB 30 2030 C-SECTI (MISCELLANEOUS) IMPLANT
STEM FEM DIST PS 20X160 T1 (Stem) ×1 IMPLANT
SUT MNCRL AB 3-0 PS2 18 (SUTURE) ×3 IMPLANT
SUT MON AB 2-0 CT1 36 (SUTURE) ×3 IMPLANT
SUT STRATAFIX PDO 1 14 VIOLET (SUTURE) ×2
SUT STRATFX PDO 1 14 VIOLET (SUTURE) ×1
SUT VIC AB 2-0 CT1 27 (SUTURE)
SUT VIC AB 2-0 CT1 TAPERPNT 27 (SUTURE) IMPLANT
SUTURE STRATFX PDO 1 14 VIOLET (SUTURE) ×2 IMPLANT
SYR 3ML LL SCALE MARK (SYRINGE) ×3 IMPLANT
TRAY FOLEY MTR SLVR 16FR STAT (SET/KITS/TRAYS/PACK) IMPLANT
TUBE SUCTION HIGH CAP CLEAR NV (SUCTIONS) ×3 IMPLANT
WATER STERILE IRR 1000ML POUR (IV SOLUTION) ×3 IMPLANT

## 2022-03-12 NOTE — Assessment & Plan Note (Addendum)
-  See hip fracture ?-Fall precaution ?

## 2022-03-12 NOTE — Anesthesia Preprocedure Evaluation (Addendum)
Anesthesia Evaluation  ?Patient identified by MRN, date of birth, ID band ?Patient awake ? ? ? ?Reviewed: ?Allergy & Precautions, NPO status , Patient's Chart, lab work & pertinent test results ? ?History of Anesthesia Complications ?Negative for: history of anesthetic complications ? ?Airway ?Mallampati: I ? ?TM Distance: >3 FB ?Neck ROM: Full ? ? ? Dental ? ?(+) Dental Advisory Given ?  ?Pulmonary ?sleep apnea (does not use CPAP) ,  ?  ?breath sounds clear to auscultation ? ? ? ? ? ? Cardiovascular ?hypertension, Pt. on medications ?(-) angina ?Rhythm:Regular Rate:Normal ?- Systolic murmurs ? ?  ?Neuro/Psych ?Anxiety Depression negative neurological ROS ?   ? GI/Hepatic ?Neg liver ROS, GERD  Controlled,  ?Endo/Other  ?negative endocrine ROS ? Renal/GU ?negative Renal ROS  ? ?  ?Musculoskeletal ? ?(+) Arthritis ,  ? Abdominal ?  ?Peds ? Hematology ?negative hematology ROS ?(+)   ?Anesthesia Other Findings ? ? Reproductive/Obstetrics ? ?  ? ? ? ? ? ? ? ? ? ? ? ? ? ?  ?  ? ? ? ? ? ? ? ?Anesthesia Physical ?Anesthesia Plan ? ?ASA: 2 ? ?Anesthesia Plan: Spinal  ? ?Post-op Pain Management: Tylenol PO (pre-op)*  ? ?Induction:  ? ?PONV Risk Score and Plan: 1 and Ondansetron and Treatment may vary due to age or medical condition ? ?Airway Management Planned: Natural Airway and Simple Face Mask ? ?Additional Equipment: None ? ?Intra-op Plan:  ? ?Post-operative Plan:  ? ?Informed Consent: I have reviewed the patients History and Physical, chart, labs and discussed the procedure including the risks, benefits and alternatives for the proposed anesthesia with the patient or authorized representative who has indicated his/her understanding and acceptance.  ? ? ? ?Dental advisory given ? ?Plan Discussed with: CRNA and Surgeon ? ?Anesthesia Plan Comments:   ? ? ? ? ? ?Anesthesia Quick Evaluation ? ?

## 2022-03-12 NOTE — Anesthesia Postprocedure Evaluation (Signed)
Anesthesia Post Note ? ?Patient: Jason [name] ? ?Procedure(s) Performed: TOTAL HIP ARTHROPLASTY ANTERIOR APPROACH (Right: Hip) ? ?  ? ?Patient location during evaluation: PACU ?Anesthesia Type: Spinal ?Level of consciousness: awake and alert and patient cooperative ?Pain management: pain level controlled ?Vital Signs Assessment: post-procedure vital signs reviewed and stable ?Respiratory status: spontaneous breathing, nonlabored ventilation, respiratory function stable and patient connected to nasal cannula oxygen ?Cardiovascular status: blood pressure returned to baseline and stable ?Postop Assessment: no apparent nausea or vomiting, patient able to bend at knees and spinal receding ?Anesthetic complications: no ? ? ?No notable events documented. ? ?Last Vitals:  ?Vitals:  ? 03/12/22 1115 03/12/22 1129  ?BP: 125/90 (!) 127/58  ?Pulse: (!) 57 (!) 54  ?Resp: (!) 22 14  ?Temp: 36.9 ?C   ?SpO2: 100% 95%  ?  ?Last Pain:  ?Vitals:  ? 03/12/22 1129  ?TempSrc:   ?PainSc: 0-No pain  ? ? ?  ?  ?  ?  ?  ?  ? ?Rian Koon,E. Shakeel Disney ? ? ? ? ?

## 2022-03-12 NOTE — Transfer of Care (Signed)
Immediate Anesthesia Transfer of Care Note ? ?Patient: Jason [name] ? ?Procedure(s) Performed: TOTAL HIP ARTHROPLASTY ANTERIOR APPROACH (Right: Hip) ? ?Patient Location: PACU ? ?Anesthesia Type:Spinal ? ?Level of Consciousness: sedated, patient cooperative and responds to stimulation ? ?Airway & Oxygen Therapy: Patient Spontanous Breathing and Patient connected to face mask oxygen ? ?Post-op Assessment: Report given to RN and Post -op Vital signs reviewed and stable ? ?Post vital signs: Reviewed and stable ? ?Last Vitals:  ?Vitals Value Taken Time  ?BP    ?Temp    ?Pulse    ?Resp    ?SpO2    ? ? ?Last Pain:  ?Vitals:  ? 03/12/22 0455  ?TempSrc:   ?PainSc: 3   ?   ? ?Patients Stated Pain Goal: 4 (03/12/22 0425) ? ?Complications: No notable events documented. ?

## 2022-03-12 NOTE — Anesthesia Procedure Notes (Signed)
Spinal ? ?Patient location during procedure: OR ?End time: 03/12/2022 8:02 AM ?Reason for block: surgical anesthesia ?Staffing ?Performed: anesthesiologist  ?Anesthesiologist: Annye Asa, MD ?Preanesthetic Checklist ?Completed: patient identified, IV checked, site marked, risks and benefits discussed, surgical consent, monitors and equipment checked, pre-op evaluation and timeout performed ?Spinal Block ?Patient position: sitting ?Prep: DuraPrep and site prepped and draped ?Patient monitoring: blood pressure, continuous pulse ox, cardiac monitor and heart rate ?Approach: midline ?Location: L3-4 ?Injection technique: single-shot ?Needle ?Needle type: Pencan and Introducer  ?Needle gauge: 24 G ?Needle length: 9 cm ?Assessment ?Events: CSF return ?Additional Notes ?Pt identified in Operating room.  Monitors applied. Working IV access confirmed. Sterile prep, drape lumbar spine.  1% lido local L 3,4.  #24ga Pencan into clear CSF L 3,4.  '15mg'$  0.75% Bupivacaine with dextrose injected with asp CSF beginning and end of injection.  Patient asymptomatic, VSS, no heme aspirated, tolerated well.  Jenita Seashore, MD ?  ? ? ? ?

## 2022-03-12 NOTE — Progress Notes (Signed)
?PROGRESS NOTE ? ?SYON TEWS QIW:[id number] DOB: Feb 13, 1941  ? ?PCP: [name], MD ? ?Patient is from: Home. ? ?DOA: 03/11/2022 LOS: 0 ? ?Chief complaints ?Chief Complaint  ?Patient presents with  ? Fall  ? Hip Pain  ?  ? ?Brief Narrative / Interim history: ?81 year old M with PMH of dementia, OSA not on CPAP, A-fib not on anticoagulation, HTN, osteoarthritis, anxiety and depression presenting with right hip pain after accidental fall when he lost his footing while attempting to carry heavy [place] up the steps to his home in the rain.  No head trauma or LOC.  Admitted for right femoral neck fracture.  Underwent ORIF on 03/12/2022.  ? ?Subjective: ?Seen and examined this afternoon after he returned from surgery.  No major events this morning.  Pain fairly controlled.  He says he is a little bit confused after surgery from anesthesia.  Patient's wife at bedside. ? ?Objective: ?Vitals:  ? 03/12/22 1129 03/12/22 1145 03/12/22 1154 03/12/22 1208  ?BP: (!) 127/58 115/64 (!) 139/103 (!) 83/64  ?Pulse: (!) 54 63 (!) 55 70  ?Resp: '14 13 13   '$ ?Temp:  98.2 ?F (36.8 ?C)  98.1 ?F (36.7 ?C)  ?TempSrc:    Oral  ?SpO2: 95% 96% 98% 94%  ?Weight:      ?Height:      ? ? ?Examination: ? ?GENERAL: No apparent distress.  Nontoxic. ?HEENT: MMM.  Vision and hearing grossly intact.  ?NECK: Supple.  No apparent JVD.  ?RESP:  No IWOB.  Fair aeration bilaterally. ?CVS:  RRR. Heart sounds normal.  ?ABD/GI/GU: BS+. Abd soft, NTND.  ?MSK/EXT:  Moves extremities. No apparent deformity. No edema.  ?SKIN: Dressing over right anterior thigh DCI. ?NEURO: Awake, alert and oriented appropriately.  No apparent focal neuro deficit. ?PSYCH: Calm. Normal affect.  ? ?Procedures:  ?3/18-ORIF of right hip by Dr. Lyla Glassing ? ?Microbiology summarized: ?Surgical MRSA PCR screen negative. ? ?Assessment and Plan: ?* Hip fracture ([place]) ?Traumatic from accidental fall.  ?-S/p ORIF by Dr. Lyla Glassing on 03/12/2022 ?-WBAT, aspirin 81 mg twice daily for DVT  prophylaxis and pain control per Ortho ?-PT/OT evaluation ?-Bowel regimen ? ?Accidental fall ?- See hip fracture ?-Fall precaution ? ?Cognitive impairment ?Continue home Aricept.   ?-He has mild first-degree AV block.  Monitor for bradycardia ?-Reorientation and delirium precautions ? ?Mood disorder ([place]) ?Stable ?-Continue home lithium and Zoloft. ? ?OSA (obstructive sleep apnea) ?Not on CPAP or oxygen. ? ?Hypertension ?Normotensive for most part. ?-Continue home meds ? ?Osteoarthritis ?Pain control as above ? ?History of atrial fibrillation ?Currently in normal sinus rhythm except for borderline first-degree AVB likely from Aricept.  Not on meds for A-fib. ? ? ?Increased nutrient needs ?Body mass index is 26.44 kg/m?Marland Kitchen ?Nutrition Problem: Increased nutrient needs ?Etiology: post-op healing, hip fracture ?Signs/Symptoms: estimated needs ?Interventions: Ensure Surgery, MVI ?  ?DVT prophylaxis:  ?SCDs Start: 03/12/22 1227 ?SCDs Start: 03/12/22 0148 ? ?Code Status: Full code ?Family Communication: Updated patient's wife at bedside. ?Level of care: Telemetry ?Status is: Inpatient ?Remains inpatient appropriate because: Right hip fracture ? ? ?Final disposition: TBD ? ?Consultants:  ?Orthopedic surgery ? ?Sch Meds:  ?Scheduled Meds: ? acetaminophen      ? amLODipine  5 mg Oral Daily  ? aspirin  81 mg Oral BID  ? [START ON 03/13/2022] dexamethasone (DECADRON) injection  10 mg Intravenous Once  ? docusate sodium  100 mg Oral BID  ? donepezil  10 mg Oral QHS  ? feeding supplement  237 mL  Oral BID BM  ? lithium carbonate  450 mg Oral Daily  ? multivitamin with minerals  1 tablet Oral Daily  ? senna  1 tablet Oral BID  ? sertraline  100 mg Oral Daily  ? simvastatin  20 mg Oral Daily  ? ?Continuous Infusions: ? sodium chloride 75 mL/hr at 03/12/22 0600  ? sodium chloride 150 mL/hr at 03/12/22 1337  ?  ceFAZolin (ANCEF) IV 2 g (03/12/22 1338)  ? methocarbamol (ROBAXIN) IV    ? tranexamic acid    ? ?PRN Meds:.[START ON 03/13/2022]  acetaminophen, alum & mag hydroxide-simeth, diphenhydrAMINE, HYDROcodone-acetaminophen, HYDROcodone-acetaminophen, menthol-cetylpyridinium **OR** phenol, methocarbamol **OR** methocarbamol (ROBAXIN) IV, metoCLOPramide **OR** metoCLOPramide (REGLAN) injection, morphine injection, ondansetron **OR** ondansetron (ZOFRAN) IV, polyethylene glycol, senna-docusate ? ?Antimicrobials: ?Anti-infectives (From admission, onward)  ? ? Start     Dose/Rate Route Frequency Ordered Stop  ? 03/12/22 1400  ceFAZolin (ANCEF) IVPB 2g/100 mL premix       ? 2 g ?200 mL/hr over 30 Minutes Intravenous Every 6 hours 03/12/22 1227 03/13/22 0159  ? 03/12/22 0730  ceFAZolin (ANCEF) IVPB 2g/100 mL premix       ? 2 g ?200 mL/hr over 30 Minutes Intravenous On call to O.R. 03/12/22 9233 03/12/22 0823  ? 03/12/22 0646  ceFAZolin (ANCEF) 2-4 GM/100ML-% IVPB       ?Note to Pharmacy: Dara Lords M: cabinet override  ?    03/12/22 0076 03/12/22 0823  ? ?  ? ? ? ?I have personally reviewed the following labs and images: ?CBC: ?Recent Labs  ?Lab 03/11/22 ?2100 03/12/22 ?2263  ?WBC 10.6* 8.9  ?NEUTROABS 9.2*  --   ?HGB 13.4 12.0*  ?HCT 39.9 36.1*  ?MCV 97.6 97.6  ?PLT 147* 142*  ? ?BMP &GFR ?Recent Labs  ?Lab 03/11/22 ?2100 03/12/22 ?3354  ?NA 136 137  ?K 4.5 4.0  ?CL 106 105  ?CO2 21* 24  ?GLUCOSE 123* 122*  ?BUN 19 19  ?CREATININE 0.93 0.99  ?CALCIUM 9.4 9.4  ? ?Estimated Creatinine Clearance: 69.5 mL/min (by C-G formula based on SCr of 0.99 mg/dL). ?Liver & Pancreas: ?No results for input(s): AST, ALT, ALKPHOS, BILITOT, PROT, ALBUMIN in the last 168 hours. ?No results for input(s): LIPASE, AMYLASE in the last 168 hours. ?No results for input(s): AMMONIA in the last 168 hours. ?Diabetic: ?Recent Labs  ?  03/12/22 ?5625  ?HGBA1C 5.3  ? ?Recent Labs  ?Lab 03/12/22 ?6389 03/12/22 ?1302  ?GLUCAP 125* 98  ? ?Cardiac Enzymes: ?No results for input(s): CKTOTAL, CKMB, CKMBINDEX, TROPONINI in the last 168 hours. ?No results for input(s): PROBNP in the last 8760  hours. ?Coagulation Profile: ?No results for input(s): INR, PROTIME in the last 168 hours. ?Thyroid Function Tests: ?No results for input(s): TSH, T4TOTAL, FREET4, T3FREE, THYROIDAB in the last 72 hours. ?Lipid Profile: ?No results for input(s): CHOL, HDL, LDLCALC, TRIG, CHOLHDL, LDLDIRECT in the last 72 hours. ?Anemia Panel: ?No results for input(s): VITAMINB12, FOLATE, FERRITIN, TIBC, IRON, RETICCTPCT in the last 72 hours. ?Urine analysis: ?   ?Component Value Date/Time  ? [place] YELLOW 03/12/2022 0514  ? APPEARANCEUR CLEAR 03/12/2022 0514  ? LABSPEC 1.019 03/12/2022 0514  ? PHURINE 5.0 03/12/2022 0514  ? [place] NEGATIVE 03/12/2022 0514  ? GLUCOSEU NEGATIVE 03/04/2011 0831  ? HGBUR MODERATE (A) 03/12/2022 0514  ? [place] NEGATIVE 03/12/2022 0514  ? KETONESUR 5 (A) 03/12/2022 0514  ? [place] NEGATIVE 03/12/2022 0514  ? UROBILINOGEN 0.2 03/04/2011 0831  ? NITRITE NEGATIVE 03/12/2022 0514  ? LEUKOCYTESUR NEGATIVE  03/12/2022 0514  ? ?Sepsis Labs: ?Invalid input(s): PROCALCITONIN, LACTICIDVEN ? ?Microbiology: ?Recent Results (from the past 240 hour(s))  ?Surgical PCR screen     Status: None  ? Collection Time: 03/12/22  4:08 AM  ? Specimen: Nasal Mucosa; Nasal Swab  ?Result Value Ref Range Status  ? MRSA, PCR NEGATIVE NEGATIVE Final  ? Staphylococcus aureus NEGATIVE NEGATIVE Final  ?  Comment: (NOTE) ?The Xpert SA Assay (FDA approved for NASAL specimens in patients 48 ?years of age and older), is one component of a comprehensive ?surveillance program. It is not intended to diagnose infection nor to ?guide or monitor treatment. ?Performed at [facility], [address] ?[place], [place] [id number] ?  ? ? ?Radiology Studies: ?DG Pelvis Portable ? ?Result Date: 03/12/2022 ?CLINICAL DATA:  Right hip replacement. EXAM: PORTABLE PELVIS 1-2 VIEWS COMPARISON:  CT right hip from yesterday. FINDINGS: The right hip demonstrates a total arthroplasty without evidence of hardware failure or  complication. There is no fracture or dislocation. The alignment is anatomic. Post-surgical changes noted in the surrounding soft tissues. IMPRESSION: 1. Interval right total hip arthroplasty without evidence of acute

## 2022-03-12 NOTE — Assessment & Plan Note (Signed)
Not on CPAP or oxygen. ?

## 2022-03-12 NOTE — H&P (Signed)
?History and Physical  ? ? ?FAVOR HACKLER SAY:[id number] DOB: 1941-10-01 DOA: 03/11/2022 ? ?PCP: [name], MD  ?Patient coming from: home ? ?I have personally briefly reviewed patient's old medical records in [place] ? ?Chief Complaint: right hip pain s/p fall ? ?HPI: SIRIS HOOS is a 81 y.o. male with medical history significant of  ?hypertension, obstructive sleep apnea not currently treated with CPAP, osteoarthritis, hyperlipidemia, history of transient atrial fibrillation, generalized anxiety as well as depression.  Patient presents to ed after mechanical fall after he lost his footing while attempting to carry heavy parcels up the steps to his home while it was raining. Patient states he has moderate pain currently in right hip which is worse with movement. He otherwise has no other complaints. He denies sob/chest pain/presyncope/ head trauma with fall/ n/v/d/f/ cough or chills. He does not mild rhinorrhea but notes he has history of seasonal allergies.  ? ?ED Course:  ?Avss ?Patient seen by ortho in ed with plans for R THR in am  ?Hip xray:.Acute minimally impacted right subcapital femoral neck fracture. ?Tx :fentanyl x 2 ?Review of Systems: As per HPI otherwise 10 point review of systems negative.  ? ?Past Medical History:  ?Diagnosis Date  ? Anxiety   ? Atrial fibrillation ([place])   ? past hx  ? Colon polyp   ? Depression   ? GERD (gastroesophageal reflux disease)   ? Gout, unspecified   ? Hypertension   ? Osteoarthrosis, unspecified whether generalized or localized, unspecified site   ? Pneumonia, organism unspecified(486)   ? Pure hypercholesterolemia   ? Routine general medical examination at a health care facility   ? Sleep apnea   ? not using cpap currently  ? Unspecified nonpsychotic mental disorder   ? ? ?Past Surgical History:  ?Procedure Laterality Date  ? COLONOSCOPY  10/04/2006  ? jacobs  ? UPPER GASTROINTESTINAL ENDOSCOPY  2015  ? WISDOM TOOTH EXTRACTION    ? ? ? reports that he  has never smoked. He has never used smokeless tobacco. He reports that he does not drink alcohol and does not use drugs. ? ?No Known Allergies ? ?Family History  ?Problem Relation Age of Onset  ? Dementia Mother   ? Pneumonia Father   ? Heart failure Father   ? Arthritis Other   ? Colon cancer Neg Hx   ? Colon polyps Neg Hx   ? Esophageal cancer Neg Hx   ? Stomach cancer Neg Hx   ? Rectal cancer Neg Hx   ? ? ?Prior to Admission medications   ?Medication Sig Start Date End Date Taking? Authorizing Provider  ?amLODipine (NORVASC) 5 MG tablet TAKE 1 TABLET BY MOUTH EVERY DAY 11/08/21  Yes Burchette, Alinda Sierras, MD  ?donepezil (ARICEPT) 10 MG tablet Take 1 tablet (10 mg total) by mouth at bedtime. 06/10/21  Yes Cottle, Billey Co., MD  ?lithium carbonate 150 MG capsule Take 3 capsules (450 mg total) by mouth daily. 12/09/21  Yes Cottle, Billey Co., MD  ?Multiple Vitamins-Minerals (MULTIVITAMIN ADULTS) TABS Take 1 tablet by mouth daily.   Yes [provider]  ?sertraline (ZOLOFT) 100 MG tablet Take 2 tablets (200 mg total) by mouth daily. ?Patient taking differently: Take 100 mg by mouth daily. 12/09/21  Yes Cottle, Billey Co., MD  ?simvastatin (ZOCOR) 20 MG tablet Take 1 tablet (20 mg total) by mouth daily. 02/02/22  Yes Burchette, Alinda Sierras, MD  ?triamcinolone cream (KENALOG) 0.1 % Apply  1 application topically 2 (two) times daily as needed. ?Patient taking differently: Apply 1 application. topically 2 (two) times daily as needed (itching). 02/02/22  Yes Burchette, Alinda Sierras, MD  ?Vitamin D, Ergocalciferol, (DRISDOL) 1.25 MG (50000 UNIT) CAPS capsule TAKE 1 CAPSULE EVERY 7 DAYS 12/28/21  Yes Burchette, Alinda Sierras, MD  ?senna-docusate (SENOKOT-S) 8.6-50 MG tablet Take 2 tablets by mouth daily as needed for mild constipation. ?Patient not taking: Reported on 03/11/2022 04/19/20   Petrucelli, Glynda Jaeger, PA-C  ? ? ?Physical Exam: ?Vitals:  ? 03/11/22 2130 03/11/22 2215 03/11/22 2245 03/11/22 2330  ?BP: (!) 153/89 128/72 133/80  (!) 108/57  ?Pulse: 74 79 81 76  ?Resp: '18 16  16  '$ ?Temp:      ?TempSrc:      ?SpO2: 96% 90% 92% 94%  ? ? ? ?Vitals:  ? 03/11/22 2130 03/11/22 2215 03/11/22 2245 03/11/22 2330  ?BP: (!) 153/89 128/72 133/80 (!) 108/57  ?Pulse: 74 79 81 76  ?Resp: '18 16  16  '$ ?Temp:      ?TempSrc:      ?SpO2: 96% 90% 92% 94%  ?Constitutional: NAD, calm, comfortable ?Eyes: PERRL, lids and conjunctivae normal ?ENMT: Mucous membranes are moist. Posterior pharynx clear of any exudate or lesions.Normal dentition.  ?Neck: normal, supple, no masses, no thyromegaly ?Respiratory: clear to auscultation bilaterally, no wheezing, no crackles. Normal respiratory effort. No accessory muscle use.  ?Cardiovascular: Regular rate and rhythm, no murmurs / rubs / gallops. No extremity edema. 2+ pedal pulses. No carotid bruits.  ?Abdomen: no tenderness, no masses palpated. No hepatosplenomegaly. Bowel sounds positive.  ?Musculoskeletal: no clubbing / cyanosis. No joint deformity upper, extremities.  no contractures. Normal muscle tone. Pain on minimal movement of right leg, ?Skin: no rashes, lesions, ulcers. No induration ?Neurologic: CN 2-12 grossly intact. Sensation intact, 5/5 upper ext, and left right not tested  ?Psychiatric: Normal judgment and insight. Alert and oriented x 3. Normal mood.  ? ? ?Labs on Admission: I have personally reviewed following labs and imaging studies ? ?CBC: ?Recent Labs  ?Lab 03/11/22 ?2100  ?WBC 10.6*  ?NEUTROABS 9.2*  ?HGB 13.4  ?HCT 39.9  ?MCV 97.6  ?PLT 147*  ? ?Basic Metabolic Panel: ?Recent Labs  ?Lab 03/11/22 ?2100  ?NA 136  ?K 4.5  ?CL 106  ?CO2 21*  ?GLUCOSE 123*  ?BUN 19  ?CREATININE 0.93  ?CALCIUM 9.4  ? ?GFR: ?CrCl cannot be calculated (Unknown ideal weight.). ?Liver Function Tests: ?No results for input(s): AST, ALT, ALKPHOS, BILITOT, PROT, ALBUMIN in the last 168 hours. ?No results for input(s): LIPASE, AMYLASE in the last 168 hours. ?No results for input(s): AMMONIA in the last 168 hours. ?Coagulation  Profile: ?No results for input(s): INR, PROTIME in the last 168 hours. ?Cardiac Enzymes: ?No results for input(s): CKTOTAL, CKMB, CKMBINDEX, TROPONINI in the last 168 hours. ?BNP (last 3 results) ?No results for input(s): PROBNP in the last 8760 hours. ?HbA1C: ?No results for input(s): HGBA1C in the last 72 hours. ?CBG: ?No results for input(s): GLUCAP in the last 168 hours. ?Lipid Profile: ?No results for input(s): CHOL, HDL, LDLCALC, TRIG, CHOLHDL, LDLDIRECT in the last 72 hours. ?Thyroid Function Tests: ?No results for input(s): TSH, T4TOTAL, FREET4, T3FREE, THYROIDAB in the last 72 hours. ?Anemia Panel: ?No results for input(s): VITAMINB12, FOLATE, FERRITIN, TIBC, IRON, RETICCTPCT in the last 72 hours. ?Urine analysis: ?   ?Component Value Date/Time  ? COLORURINE STRAW (A) 04/19/2020 1602  ? APPEARANCEUR CLEAR 04/19/2020 1602  ? LABSPEC <1.005 (L)  04/19/2020 1602  ? PHURINE 7.0 04/19/2020 1602  ? GLUCOSEU NEGATIVE 04/19/2020 1602  ? GLUCOSEU NEGATIVE 03/04/2011 0831  ? [place] NEGATIVE 04/19/2020 1602  ? [place] NEGATIVE 04/19/2020 1602  ? [place] NEGATIVE 04/19/2020 1602  ? [place] NEGATIVE 04/19/2020 1602  ? UROBILINOGEN 0.2 03/04/2011 0831  ? NITRITE NEGATIVE 04/19/2020 1602  ? LEUKOCYTESUR NEGATIVE 04/19/2020 1602  ? ? ?Radiological Exams on Admission: ?CT HIP RIGHT WO CONTRAST ? ?Result Date: 03/11/2022 ?CLINICAL DATA:  Right hip fracture. EXAM: CT OF THE RIGHT HIP WITHOUT CONTRAST TECHNIQUE: Multidetector CT imaging of the right hip was performed according to the standard protocol. Multiplanar CT image reconstructions were also generated. RADIATION DOSE REDUCTION: This exam was performed according to the departmental dose-optimization program which includes automated exposure control, adjustment of the mA and/or kV according to patient size and/or use of iterative reconstruction technique. COMPARISON:  Right hip x-rays from same day. FINDINGS: Bones/Joint/Cartilage Acute minimally impacted right  subcapital femoral neck fracture again noted. No additional fracture. No dislocation. Mild-to-moderate right hip joint space narrowing with marginal osteophytes and subchondral cysts. Faint chondrocalcinosis. [place]

## 2022-03-12 NOTE — Assessment & Plan Note (Addendum)
Traumatic from accidental fall.  S/p ORIF by Dr. Lyla Glassing on 03/12/2022.  Vitamin D 54.  Some erythema around surgical site.  Isolated fever to 101.4 on postop day 1.  This was in the setting of increased room temperature.  He has not had further fever. ?-WBAT, aspirin 81 mg twice daily for DVT prophylaxis and pain control per Ortho ?-Scheduled Tylenol 1 g 3 times daily for 5 days followed by 1 g 3 times daily as needed ?-Oxycodone 5 mg every 8 hours as needed severe pain ?-Bowel regimen ?-Continue therapy at SNF. ?-Outpatient follow-up with orthopedic surgery ?

## 2022-03-12 NOTE — Progress Notes (Signed)
PT Cancellation Note ? ?Patient Details ?Name: Jason [name] ?MRN: [id number] ?DOB: 1941/02/22 ? ? ?Cancelled Treatment:    Reason Eval/Treat Not Completed: Patient at procedure or test/unavailable (pt in surgery, will follow.) ? ? ?Jason [name] ?03/12/2022, 9:05 AM ? ? ?

## 2022-03-12 NOTE — Op Note (Signed)
OPERATIVE REPORT ? ?SURGEON: Rod Can, MD  ? ?ASSISTANT: Larene Pickett, PA-C ? ?PREOPERATIVE DIAGNOSIS: Displaced Right femoral neck fracture.  ? ?POSTOPERATIVE DIAGNOSIS: Displaced Right femoral neck fracture.  ? ?PROCEDURE: Right total hip arthroplasty, anterior approach.  ? ?IMPLANTS: Biomet Taperloc Reduced Distal stem, size 20 x 160 mm, high offset. ?Biomet G7 OsseoTi Cup, size 62 mm. ?Biomet Vivacit-E liner, size 36 mm, H, neutral. ?Biomet metal head ball, size 36 + 0 mm. ? ?ANESTHESIA:  MAC and Spinal ? ?ANTIBIOTICS: 2g ancef. ? ?ESTIMATED BLOOD LOSS:-500 mL   ? ?DRAINS: None. ? ?COMPLICATIONS: None ?  ?CONDITION: PACU - hemodynamically stable.  ? ?BRIEF CLINICAL NOTE: Jason [name] is a 81 y.o. male with a displaced Right femoral neck fracture. The patient was admitted to the hospitalist service and underwent perioperative risk stratification and medical optimization. The risks, benefits, and alternatives to total hip arthroplasty were explained, and the patient elected to proceed. ? ?PROCEDURE IN DETAIL: The patient was taken to the operating room and general anesthesia was induced on the hospital bed.  The patient was then positioned on the [place] table.  All bony prominences were well padded.  The hip was prepped and draped in the normal sterile surgical fashion.  A time-out was called verifying side and site of surgery. Antibiotics were given within 60 minutes of beginning the procedure. ?  ?Bikini incision was made, and the direct anterior approach to the hip was performed through the Hueter interval.  Lateral femoral circumflex vessels were treated with the Auqumantys. The anterior capsule was exposed and an inverted T capsulotomy was made.  Fracture hematoma was encountered and evacuated. The patient was found to have a comminuted Right subcapital femoral neck fracture.  I freshened the femoral neck cut with a saw.  I removed the femoral neck fragment.  A corkscrew was placed into the head and the  head was removed.  This was passed to the back table and was measured. The was full thickness cartilage loss on the superior femoral head. The pubofemoral ligament was released subperiosteally to the lesser trochanter. ? ?Acetabular exposure was achieved, and the pulvinar and labrum were excised. Sequential reaming of the acetabulum was then performed up to a size 61 mm reamer under direct visulization. A 62 mm cup was then opened and impacted into place at approximately 40 degrees of abduction and 20 degrees of anteversion. The final polyethylene liner was impacted into place and acetabular osteophytes were removed.  ?  ?I then gained femoral exposure taking care to protect the abductors and greater trochanter.  This was performed using standard external rotation, extension, and adduction.  A cookie cutter was used to enter the femoral canal, and then the femoral canal finder was placed.  Sequential broaching was performed up to a size 20 mm.  Calcar planer was used on the femoral neck remnant.  I placed a high offset neck and a trial head ball.  The hip was reduced.  Leg lengths and offset were checked fluoroscopically.  The hip was dislocated and trial components were removed.  The final implants were placed, and the hip was reduced.  Fluoroscopy was used to confirm component position and leg lengths.  At 90 degrees of external rotation and full extension, the hip was stable to an anterior directed force. ?  ?The wound was copiously irrigated with Irrisept solution and normal saline using pule lavage.  Marcaine solution was injected into the periarticular soft tissue.  The wound was closed in layers  using #1 Stratafix for the fascia, 2-0 Vicryl for the subcutaneous fat, 2-0 Monocryl for the deep dermal layer, 3-0 running Monocryl subcuticular stitch, and Dermabond for the skin.  Once the glue was fully dried, an Aquacell Ag dressing was applied.  The patient was transported to the recovery room in stable  condition.  Sponge, needle, and instrument counts were correct at the end of the case x2.  The patient tolerated the procedure well and there were no known complications. ? ?Please note that a surgical assistant was a medical necessity for this procedure to perform it in a safe and expeditious manner. Assistant was necessary to provide appropriate retraction of vital neurovascular structures, to prevent femoral fracture, and to allow for anatomic placement of the prosthesis. ? ?POSTOPERATIVE PLAN: Postoperatively, the patient be readmitted to the hospitalist service.  Weightbearing as tolerated right lower extremity with a walker.  Aspirin 81 mg p.o. twice daily for DVT prophylaxis.  Mobilize out of bed with PT.  P.o. pain control.  Patient will undergo disposition planning.  Return to the office in 2 weeks for routine postoperative care. ?

## 2022-03-12 NOTE — Assessment & Plan Note (Signed)
Pain control as above ?

## 2022-03-12 NOTE — TOC Progression Note (Signed)
Transition of Care (TOC) - Progression Note  ? ? ?Patient Details  ?Name: MONIQUE GIFT ?MRN: [id number] ?Date of Birth: September 28, 1941 ? ?Transition of Care (TOC) CM/SW Contact  ?Sabeen Piechocki, Marta Lamas, LCSW ?Phone Number: ?03/12/2022, 2:25 PM ? ?Clinical Narrative:    ? ?Consult received for SNF placement.  Still awaiting PT/OT consult, evaluation, and recommendations.  Patient was sleeping soundly and unable to arouse at time of visit.  HIPAA compliant message left on voicemail for patient's wife.  Awaiting a return call to discuss discharge disposition. ? ? ?Expected Discharge Plan and Services ?  ?Short-Term SNF for rehabilitative services. ? ?Social Determinants of Health (SDOH) Interventions ?  ? ?Readmission Risk Interventions ?No flowsheet data found. ? ?

## 2022-03-12 NOTE — Assessment & Plan Note (Addendum)
Stable ?-Continue home lithium and Zoloft. ?

## 2022-03-12 NOTE — Discharge Instructions (Signed)
? ?Dr. Brian Swinteck ?Joint Replacement Specialist ?[facility] ?[address] ?, [place] [address] ?[phone] ? ? ?TOTAL HIP REPLACEMENT POSTOPERATIVE DIRECTIONS ? ? ? ?Hip Rehabilitation, Guidelines Following Surgery  ? ?WEIGHT BEARING ?Weight bearing as tolerated with assist device (walker, cane, etc) as directed, use it as long as suggested by your surgeon or therapist, typically at least 4-6 weeks. ? ?The results of a hip operation are greatly improved after range of motion and muscle strengthening exercises. Follow all safety measures which are given to protect your hip. If any of these exercises cause increased pain or swelling in your joint, decrease the amount until you are comfortable again. Then slowly increase the exercises. Call your caregiver if you have problems or questions.  ? ?HOME CARE INSTRUCTIONS  ?Most of the following instructions are designed to prevent the dislocation of your new hip.  ?Remove items at home which could result in a fall. This includes throw rugs or furniture in walking pathways.  ?Continue medications as instructed at time of discharge. ?You may have some home medications which will be placed on hold until you complete the course of blood thinner medication. ?You may start showering once you are discharged home. Do not remove your dressing. ?Do not put on socks or shoes without following the instructions of your caregivers.   ?Sit on chairs with arms. Use the chair arms to help push yourself up when arising.  ?Arrange for the use of a toilet seat elevator so you are not sitting low.  ?Walk with walker as instructed.  ?You may resume a sexual relationship in one month or when given the OK by your caregiver.  ?Use walker as long as suggested by your caregivers.  ?You may put full weight on your legs and walk as much as is comfortable. ?Avoid periods of inactivity such as sitting longer than an hour when not asleep. This helps prevent blood  clots.  ?You may return to work once you are cleared by your surgeon.  ?Do not drive a car for 6 weeks or until released by your surgeon.  ?Do not drive while taking narcotics.  ?Wear elastic stockings for two weeks following surgery during the day but you may remove then at night.  ?Make sure you keep all of your appointments after your operation with all of your doctors and caregivers. You should call the office at the above phone number and make an appointment for approximately two weeks after the date of your surgery. ?Please pick up a stool softener and laxative for home use as long as you are requiring pain medications. ?ICE to the affected hip every three hours for 30 minutes at a time and then as needed for pain and swelling. Continue to use ice on the hip for pain and swelling from surgery. You may notice swelling that will progress down to the foot and ankle.  This is normal after surgery.  Elevate the leg when you are not up walking on it.   ?It is important for you to complete the blood thinner medication as prescribed by your doctor. ?Continue to use the breathing machine which will help keep your temperature down.  It is common for your temperature to cycle up and down following surgery, especially at night when you are not up moving around and exerting yourself.  The breathing machine keeps your lungs expanded and your temperature down. ? ?RANGE OF MOTION AND STRENGTHENING EXERCISES  ?These exercises are designed to help you   keep full movement of your hip joint. Follow your caregiver's or physical therapist's instructions. Perform all exercises about fifteen times, three times per day or as directed. Exercise both hips, even if you have had only one joint replacement. These exercises can be done on a training (exercise) mat, on the floor, on a table or on a bed. Use whatever works the best and is most comfortable for you. Use music or television while you are exercising so that the exercises are a  pleasant break in your day. This will make your life better with the exercises acting as a break in routine you can look forward to.  ?Lying on your back, slowly slide your foot toward your buttocks, raising your knee up off the floor. Then slowly slide your foot back down until your leg is straight again.  ?Lying on your back spread your legs as far apart as you can without causing discomfort.  ?Lying on your side, raise your upper leg and foot straight up from the floor as far as is comfortable. Slowly lower the leg and repeat.  ?Lying on your back, tighten up the muscle in the front of your thigh (quadriceps muscles). You can do this by keeping your leg straight and trying to raise your heel off the floor. This helps strengthen the largest muscle supporting your knee.  ?Lying on your back, tighten up the muscles of your buttocks both with the legs straight and with the knee bent at a comfortable angle while keeping your heel on the floor.  ? ?SKILLED REHAB INSTRUCTIONS: ?If the patient is transferred to a skilled rehab facility following release from the hospital, a list of the current medications will be sent to the facility for the patient to continue.  When discharged from the skilled rehab facility, please have the facility set up the patient's Home Health Physical Therapy prior to being released. Also, the skilled facility will be responsible for providing the patient with their medications at time of release from the facility to include their pain medication and their blood thinner medication. If the patient is still at the rehab facility at time of the two week follow up appointment, the skilled rehab facility will also need to assist the patient in arranging follow up appointment in our office and any transportation needs. ? ?POST-OPERATIVE OPIOID TAPER INSTRUCTIONS: ?It is important to wean off of your opioid medication as soon as possible. If you do not need pain medication after your surgery it is ok  to stop day one. ?Opioids include: ?Codeine, Hydrocodone(Norco, Vicodin), Oxycodone(Percocet, oxycontin) and hydromorphone amongst others.  ?Long term and even short term use of opiods can cause: ?Increased pain response ?Dependence ?Constipation ?Depression ?Respiratory depression ?And more.  ?Withdrawal symptoms can include ?Flu like symptoms ?Nausea, vomiting ?And more ?Techniques to manage these symptoms ?Hydrate well ?Eat regular healthy meals ?Stay active ?Use relaxation techniques(deep breathing, meditating, yoga) ?Do Not substitute Alcohol to help with tapering ?If you have been on opioids for less than two weeks and do not have pain than it is ok to stop all together.  ?Plan to wean off of opioids ?This plan should start within one week post op of your joint replacement. ?Maintain the same interval or time between taking each dose and first decrease the dose.  ?Cut the total daily intake of opioids by one tablet each day ?Next start to increase the time between doses. ?The last dose that should be eliminated is the evening dose.  ? ? ?MAKE   SURE YOU:  ?Understand these instructions.  ?Will watch your condition.  ?Will get help right away if you are not doing well or get worse. ? ?Pick up stool softner and laxative for home use following surgery while on pain medications. ?Do not remove your dressing. ?The dressing is waterproof--it is OK to take showers. ?Continue to use ice for pain and swelling after surgery. ?Do not use any lotions or creams on the incision until instructed by your surgeon. ?Total Hip Protocol. ? ?

## 2022-03-12 NOTE — Assessment & Plan Note (Addendum)
Continue home Aricept.   ?-He has mild first-degree AV block but not bradycardic. ?-Reorientation and delirium precautions ?

## 2022-03-12 NOTE — Consult Note (Signed)
Asked to eval patient with hip fracture for pain management/hip block for comfort prior to surgical repair.  ?Patient is scheduled for Total hip arthroplasty at 7:45 this am.  Will mange patient's pain perioperatively. See pre-operative evaluation for details. Jenita Seashore, MD ?

## 2022-03-12 NOTE — Assessment & Plan Note (Signed)
Normotensive for most part. °-Continue home meds °

## 2022-03-12 NOTE — Progress Notes (Signed)
OT Cancellation Note ? ?Patient Details ?Name: Jason [name] ?MRN: [id number] ?DOB: [birth year] ? ? ?Cancelled Treatment:    Reason Eval/Treat Not Completed: Patient at procedure or test/ unavailable Patient is off hall for R THA at this time. OT to continue to follow and check back after surgery for any updates to orders.  ? ?Cenia Zaragosa OTR/L, MS ?Acute Rehabilitation Department ?Office# [phone] ?Pager# [phone] ? ? ? ?03/12/2022, 7:22 AM ?

## 2022-03-12 NOTE — Hospital Course (Addendum)
81 year old M with PMH of dementia, OSA not on CPAP, A-fib not on anticoagulation, HTN, osteoarthritis, anxiety and depression presenting with right hip pain after accidental fall when he lost his footing while attempting to carry heavy [place] up the steps to his home in the rain.  No head trauma or LOC.  Admitted for right femoral neck fracture.  Underwent ORIF on 03/12/2022.  Hgb dropped from 13.4 to 8.7 but improved to 9.2 after stopping intravenous fluid.  He had about 500 cc surgical blood loss documented.  No further bleeding.  Therapy recommended SNF.  He will be WBAT per orthopedic surgery recommendation.  He will be on aspirin 81 mg twice daily for VTE prophylaxis.  Orthopedic surgery recommended outpatient follow-up in 2 weeks for wound check. ? ?

## 2022-03-12 NOTE — Progress Notes (Signed)
Initial Nutrition Assessment ? ?INTERVENTION:  ? ?-Ensure Surgery PO BID, each provides 330 kcals and 18g protein  ? ?-Multivitamin with minerals daily ? ?NUTRITION DIAGNOSIS:  ? ?Increased nutrient needs related to post-op healing, hip fracture as evidenced by estimated needs. ? ?GOAL:  ? ?Patient will meet greater than or equal to 90% of their needs ? ?MONITOR:  ? ?PO intake, Supplement acceptance, Labs, Weight trends, I & O's ? ?REASON FOR ASSESSMENT:  ? ?Consult ?Hip fracture protocol ? ?ASSESSMENT:  ? ?81 y.o. male with medical history significant of   hypertension, obstructive sleep apnea not currently treated with CPAP, osteoarthritis, hyperlipidemia, history of transient atrial fibrillation, generalized anxiety as well as depression.  Patient presents to ed after mechanical fall after he lost his footing while attempting to carry heavy parcels up the steps to his home while it was raining. ? ?Patient in OR this morning for right total hip arthroplasty.  ?Did not report any appetite changes or weight loss in MST screen. ?Pt now on regular diet. Will order Ensure Surgery supplements to aid in post-op recovery. ? ?Per weight records, pt has lost 4 lbs since 02/02/22, insignificant for time frame. ? ?Medications: Colace, Senokot ? ?Labs reviewed: ? CBGs: 125 ? ?NUTRITION - FOCUSED PHYSICAL EXAM: ? ?Unable to complete, working remotely. ? ?Diet Order:   ?Diet Order   ? ?       ?  Diet regular Room service appropriate? Yes; Fluid consistency: Thin  Diet effective now       ?  ? ?  ?  ? ?  ? ? ?EDUCATION NEEDS:  ? ?No education needs have been identified at this time ? ?Skin:  Skin Assessment: Skin Integrity Issues: ?Skin Integrity Issues:: Incisions ?Incisions: 3/18 right hip ? ?Last BM:  3/17 ? ?Height:  ? ?Ht Readings from Last 1 Encounters:  ?03/12/22 '6\' 1"'$  (1.854 m)  ? ? ?Weight:  ? ?Wt Readings from Last 1 Encounters:  ?03/12/22 90.9 kg  ? ? ?BMI:  Body mass index is 26.44 kg/m?. ? ?Estimated Nutritional Needs:   ? ?Kcal:  2100-2300 ? ?Protein:  100-110g ? ?Fluid:  2.1L/day ? ? ?Clayton Bibles, MS, RD, LDN ?Inpatient Clinical Dietitian ?Contact information available via Amion ? ?

## 2022-03-12 NOTE — Interval H&P Note (Signed)
History and Physical Interval Note: ? ?03/12/2022 ?7:50 AM ? ?Jason [name]  has presented today for surgery, with the diagnosis of broken right hip.  The various methods of treatment have been discussed with the patient and family. After consideration of risks, benefits and other options for treatment, the patient has consented to  Procedure(s): ?TOTAL HIP ARTHROPLASTY ANTERIOR APPROACH (Right) as a surgical intervention.  The patient's history has been reviewed, patient examined, no change in status, stable for surgery.  I have reviewed the patient's chart and labs.  Questions were answered to the patient's satisfaction.   ? ?Has R femoral neck fx with underlying symptomatic hip DJD.  ? ?The risks, benefits, and alternatives were discussed with the patient. There are risks associated with the surgery including, but not limited to, problems with anesthesia (death), infection, instability (giving out of the joint), dislocation, differences in leg length/angulation/rotation, fracture of bones, loosening or failure of implants, hematoma (blood accumulation) which may require surgical drainage, blood clots, pulmonary embolism, nerve injury (foot drop and lateral thigh numbness), and blood vessel injury. The patient understands these risks and elects to proceed. ? ? ? ?Jason [name] ? ? ?

## 2022-03-12 NOTE — Assessment & Plan Note (Addendum)
Currently in normal sinus rhythm except for borderline first-degree AVB likely from Aricept.  Not on meds for A-fib. ?

## 2022-03-13 DIAGNOSIS — Z8679 Personal history of other diseases of the circulatory system: Secondary | ICD-10-CM | POA: Diagnosis not present

## 2022-03-13 DIAGNOSIS — D62 Acute posthemorrhagic anemia: Secondary | ICD-10-CM | POA: Insufficient documentation

## 2022-03-13 DIAGNOSIS — D649 Anemia, unspecified: Secondary | ICD-10-CM

## 2022-03-13 DIAGNOSIS — R4189 Other symptoms and signs involving cognitive functions and awareness: Secondary | ICD-10-CM | POA: Diagnosis not present

## 2022-03-13 DIAGNOSIS — W19XXXA Unspecified fall, initial encounter: Secondary | ICD-10-CM | POA: Diagnosis not present

## 2022-03-13 DIAGNOSIS — D696 Thrombocytopenia, unspecified: Secondary | ICD-10-CM

## 2022-03-13 DIAGNOSIS — S72001A Fracture of unspecified part of neck of right femur, initial encounter for closed fracture: Secondary | ICD-10-CM | POA: Diagnosis not present

## 2022-03-13 LAB — GLUCOSE, CAPILLARY
Glucose-Capillary: 132 mg/dL — ABNORMAL HIGH (ref 70–99)
Glucose-Capillary: 121 mg/dL — ABNORMAL HIGH (ref 70–99)
Glucose-Capillary: 147 mg/dL — ABNORMAL HIGH (ref 70–99)
Glucose-Capillary: 164 mg/dL — ABNORMAL HIGH (ref 70–99)
Glucose-Capillary: 124 mg/dL — ABNORMAL HIGH (ref 70–99)

## 2022-03-13 LAB — CBC
HCT: 27 % — ABNORMAL LOW (ref 39.0–52.0)
Hemoglobin: 8.9 g/dL — ABNORMAL LOW (ref 13.0–17.0)
MCH: 33.1 pg (ref 26.0–34.0)
MCHC: 33 g/dL (ref 30.0–36.0)
MCV: 100.4 fL — ABNORMAL HIGH (ref 80.0–100.0)
Platelets: 121 10*3/uL — ABNORMAL LOW (ref 150–400)
RBC: 2.69 MIL/uL — ABNORMAL LOW (ref 4.22–5.81)
RDW: 12.7 % (ref 11.5–15.5)
WBC: 6.6 10*3/uL (ref 4.0–10.5)
nRBC: 0 % (ref 0.0–0.2)

## 2022-03-13 LAB — RENAL FUNCTION PANEL
Albumin: 3.1 g/dL — ABNORMAL LOW (ref 3.5–5.0)
Anion gap: 7 (ref 5–15)
BUN: 23 mg/dL (ref 8–23)
CO2: 22 mmol/L (ref 22–32)
Calcium: 8.3 mg/dL — ABNORMAL LOW (ref 8.9–10.3)
Chloride: 106 mmol/L (ref 98–111)
Creatinine, Ser: 1.05 mg/dL (ref 0.61–1.24)
GFR, Estimated: >60 mL/min (ref >60)
Glucose, Bld: 128 mg/dL — ABNORMAL HIGH (ref 70–99)
Phosphorus: 2.9 mg/dL (ref 2.5–4.6)
Potassium: 3.8 mmol/L (ref 3.5–5.1)
Sodium: 135 mmol/L (ref 135–145)

## 2022-03-13 LAB — MAGNESIUM: Magnesium: 1.8 mg/dL (ref 1.7–2.4)

## 2022-03-13 LAB — VITAMIN D 25 HYDROXY (VIT D DEFICIENCY, FRACTURES): Vit D, 25-Hydroxy: 54.54 ng/mL (ref 30–100)

## 2022-03-13 LAB — CK: Total CK: 447 U/L — ABNORMAL HIGH (ref 49–397)

## 2022-03-13 MED ORDER — CHLORHEXIDINE GLUCONATE CLOTH 2 % EX PADS
6.0000 | MEDICATED_PAD | Freq: Every day | CUTANEOUS | Status: DC
  Administered 2022-03-13 – 2022-03-15 (×3): 6 via TOPICAL

## 2022-03-13 NOTE — Evaluation (Addendum)
Occupational Therapy Evaluation ?Patient Details ?Name: Jason [name] ?MRN: [id number] ?DOB: 1941/08/05 ?Today's Date: 03/13/2022 ? ? ?History of Present Illness patient is a 81 year old male who presented to the hosptial with R hip pain after fall at home on steps while carrying items. patient was found to have R femoral neck fx. on 3/18 patient underwent R THA anterior. PMH: dementia, OSA not on CPAP, A-fib not on anticoagulation, HTN, osteoarthritis, anxiety and depression  ? ?Clinical Impression ?  ?Patient is a pleasant 81 year old male who was admitted for above. Patient was noted to have had a functional decline in ADLs. Patient was +2 to transfer from edge fo bed to recliner in room with increased time and cues for proper sequencing of task. Patient's increased pain, decreased activity tolerance, decreased safety awareness, decreased standing balance and decreased cardiopulmonary tolerance impacting participation in ADLs. Patient would continue to benefit from skilled OT services at this time while admitted and after d/c to address noted deficits in order to improve overall safety and independence in ADLs.  ?  ?   ? ?Recommendations for follow up therapy are one component of a multi-disciplinary discharge planning process, led by the attending physician.  Recommendations may be updated based on patient status, additional functional criteria and insurance authorization.  ? ?Follow Up Recommendations ? Skilled nursing-short term rehab (<3 hours/day)  ?  ?Assistance Recommended at Discharge Frequent or constant Supervision/Assistance  ?Patient can return home with the following Two people to help with walking and/or transfers;Two people to help with bathing/dressing/bathroom;Direct supervision/assist for medications management;Help with stairs or ramp for entrance;Assist for transportation;Direct supervision/assist for financial management;Assistance with cooking/housework ? ?  ?Functional Status Assessment ?  Patient has had a recent decline in their functional status and demonstrates the ability to make significant improvements in function in a reasonable and predictable amount of time.  ?Equipment Recommendations ? Other (comment) (RW)  ?  ?Recommendations for Other Services   ? ? ?  ?Precautions / Restrictions Precautions ?Precautions: Fall ?Precaution Comments: monitor O2 ?Restrictions ?Weight Bearing Restrictions: No ?RLE Weight Bearing: Weight bearing as tolerated  ? ?  ? ?Mobility Bed Mobility ?Overal bed mobility: Needs Assistance ?Bed Mobility: Supine to Sit ?  ?  ?Supine to sit: Max assist, HOB elevated ?  ?  ?General bed mobility comments: with increased time and sequencing cues for advancement of BLE ?  ? ?Transfers ?  ?  ?  ?  ?  ?  ?  ?  ?  ?  ?  ? ?  ?Balance Overall balance assessment: Needs assistance ?Sitting-balance support: Feet supported, Single extremity supported ?Sitting balance-Leahy Scale: Fair ?  ?  ?Standing balance support: Reliant on assistive device for balance, During functional activity, Bilateral upper extremity supported ?Standing balance-Leahy Scale: Poor ?  ?  ?  ?  ?  ?  ?  ?  ?  ?  ?  ?  ?   ? ?ADL either performed or assessed with clinical judgement  ? ?ADL Overall ADL's : Needs assistance/impaired ?Eating/Feeding: Set up;Sitting ?  ?Grooming: Wash/dry face;Oral care;Sitting;Set up ?Grooming Details (indicate cue type and reason): in recliner ?Upper Body Bathing: Minimal assistance;Sitting ?  ?Lower Body Bathing: Maximal assistance;Sit to/from stand;Sitting/lateral leans ?  ?Upper Body Dressing : Set up;Sitting ?  ?Lower Body Dressing: Maximal assistance;Sit to/from stand;Sitting/lateral leans ?  ?Toilet Transfer: Moderate assistance;+2 for physical assistance;+2 for safety/equipment;Rolling walker (2 wheels) ?Toilet Transfer Details (indicate cue type and reason): to transfer  from edge of bed to recliner. patient attempted with x1 assist with inability to clear bottom from edge of  bed safetly. once up patient needed less physical assistance to turn with good demonstration of pushing through UE to transfer. ?Toileting- Clothing Manipulation and Hygiene: Total assistance;Sit to/from stand ?  ?  ?  ?Functional mobility during ADLs: +2 for physical assistance;+2 for safety/equipment;Rolling walker (2 wheels) ?   ? ? ? ?Vision Patient Visual Report: No change from baseline ?   ?   ?Perception   ?  ?Praxis   ?  ? ?Pertinent Vitals/Pain Pain Assessment ?Pain Assessment: 0-10 ?Pain Score: 7  ?Pain Location: R hip ?Pain Descriptors / Indicators: Constant, Discomfort, Grimacing, Guarding, Shooting  ? ? ? ?Hand Dominance Right ?  ?Extremity/Trunk Assessment Upper Extremity Assessment ?Upper Extremity Assessment: Defer to OT evaluation ?  ?Lower Extremity Assessment ?Lower Extremity Assessment: RLE deficits/detail ?RLE Deficits / Details: ankle WFL, knee and hip AAROM grossly WFL, strength ~ 2+/5 ?  ?Cervical / Trunk Assessment ?Cervical / Trunk Assessment: Kyphotic ?  ?Communication Communication ?Communication: No difficulties ?  ?Cognition Arousal/Alertness: Awake/alert ?Behavior During Therapy: [facility] for tasks assessed/performed ?Overall Cognitive Status: Within Functional Limits for tasks assessed ?  ?  ?  ?  ?  ?  ?  ?  ?  ?  ?  ?  ?  ?  ?  ?  ?General Comments: patient was noted to have some memory deficits during session with patient not remembering nurse providing pain medication during session. patient's wife was present in room to help provide PLOF and accuracy to patients answers. patient reported he had never been to rehab before but was noted by wife to have been at least x2 before. ?  ?  ?General Comments    ? ?  ?Exercises   ?  ?Shoulder Instructions    ? ? ?Home Living Family/patient expects to be discharged to:: Private residence ?Living Arrangements: Spouse/significant other ?Available Help at Discharge: Family ?Type of Home: House ?Home Access: Stairs to enter ?Entrance Stairs-Number of  Steps: 3 plus threshold ?  ?Home Layout: One level ?  ?  ?Bathroom Shower/Tub: Tub/shower unit ?  ?  ?  ?  ?Home Equipment: BSC/3in1 ?  ?  ?  ? ?  ?Prior Functioning/Environment Prior Level of Function : Independent/Modified Independent ?  ?  ?  ?  ?  ?  ?  ?  ?  ? ?  ?  ?OT Problem List: Decreased activity tolerance;Impaired balance (sitting and/or standing);Decreased safety awareness;Cardiopulmonary status limiting activity;Decreased knowledge of precautions;Decreased knowledge of use of DME or AE ?  ?   ?OT Treatment/Interventions: Self-care/ADL training;Therapeutic exercise;Neuromuscular education;Energy conservation;DME and/or AE instruction;Therapeutic activities;Balance training;Patient/family education  ?  ?OT Goals(Current goals can be found in the care plan section) Acute Rehab OT Goals ?Patient Stated Goal: to go back home ?OT Goal Formulation: With patient/family ?Time For Goal Achievement: 03/27/22 ?Potential to Achieve Goals: Good  ?OT Frequency: Min 2X/week ?  ? ?Co-evaluation   ?  ?  ?  ?  ? ?  ?AM-PAC OT "6 Clicks" Daily Activity     ?Outcome Measure Help from another person eating meals?: A Little ?Help from another person taking care of personal grooming?: A Little ?Help from another person toileting, which includes using toliet, bedpan, or urinal?: Total ?Help from another person bathing (including washing, rinsing, drying)?: A Lot ?Help from another person to put on and taking off regular upper body clothing?: A Little ?  Help from another person to put on and taking off regular lower body clothing?: A Lot ?6 Click Score: 14 ?  ?End of Session Equipment Utilized During Treatment: Gait belt;Rolling walker (2 wheels) ?Nurse Communication: Other (comment) (nurse present to assist with transfer education on pain levels) ? ?Activity Tolerance: Patient tolerated treatment well ?Patient left: in chair;with call bell/phone within reach;with chair alarm set;with family/visitor present ? ?OT Visit  Diagnosis: Unsteadiness on feet (R26.81);Muscle weakness (generalized) (M62.81)  ?              ?Time: 5462-7035 ?OT Time Calculation (min): 55 min ?Charges:  OT General Charges ?$OT Visit: 1 Visit ?OT Evaluation

## 2022-03-13 NOTE — Evaluation (Signed)
Physical Therapy Evaluation ?Patient Details ?Name: Jason [name] ?MRN: [id number] ?DOB: April 25, 1941 ?Today's Date: 03/13/2022 ? ?History of Present Illness ? patient is a 81 year old male who presented to the hosptial with R hip pain after fall at home on steps while carrying items. patient was found to have R femoral neck fx. on 3/18 patient underwent R THA anterior. PMH: dementia, OSA not on CPAP, A-fib not on anticoagulation, HTN, osteoarthritis, anxiety and depression  ?Clinical Impression ? Pt is s/p THA resulting in the deficits listed below (see PT Problem List).  ?Pt amb short distance today ~ 10', requiring +2 assist for transfers, second -person for safety with gait. Fatigues easily. Recommending SNF post acute pending progress/acute LOS in this setting, possibly HHPT if progresses quickly. ? Pt will benefit from skilled PT to increase their independence and safety with mobility to allow discharge to the venue listed below.  ?   ?   ? ?Recommendations for follow up therapy are one component of a multi-disciplinary discharge planning process, led by the attending physician.  Recommendations may be updated based on patient status, additional functional criteria and insurance authorization. ? ?Follow Up Recommendations Skilled nursing-short term rehab (<3 hours/day) ? ?  ?Assistance Recommended at Discharge Frequent or constant Supervision/Assistance  ?Patient can return home with the following ? Help with stairs or ramp for entrance;A little help with bathing/dressing/bathroom;A little help with walking and/or transfers;Assistance with cooking/housework;Assist for transportation ? ?  ?Equipment Recommendations Rolling walker (2 wheels)  ?Recommendations for Other Services ?    ?  ?Functional Status Assessment Patient has had a recent decline in their functional status and demonstrates the ability to make significant improvements in function in a reasonable and predictable amount of time.  ? ?  ?Precautions /  Restrictions Precautions ?Precautions: Fall ?Precaution Comments: monitor O2 ?Restrictions ?Weight Bearing Restrictions: No ?RLE Weight Bearing: Weight bearing as tolerated  ? ?  ? ?Mobility ? Bed Mobility ?  ?  ?  ?  ?  ?  ?  ?General bed mobility comments: OOB in chair on arrival ?  ? ?Transfers ?Overall transfer level: Needs assistance ?Equipment used: Rolling walker (2 wheels) ?Transfers: Sit to/from Stand ?Sit to Stand: Mod assist, +2 physical assistance, +2 safety/equipment ?  ?  ?  ?  ?  ?General transfer comment: assist with anterior-superior wt shift, cues for incr knee flexion and hand placement ?  ? ?Ambulation/Gait ?Ambulation/Gait assistance: Min assist, Mod assist ?Gait Distance (Feet): 10 Feet ?Assistive device: Rolling walker (2 wheels) ?Gait Pattern/deviations: Step-to pattern, Decreased stance time - right ?  ?  ?  ?General Gait Details: multi-modal cues for sequence, difficulty advancing RLE, intermittent assist from therapist to advance RLE, fatigues rapidly requiring seated rest after 10' ? ?Stairs ?  ?  ?  ?  ?  ? ?Wheelchair Mobility ?  ? ?Modified Rankin (Stroke Patients Only) ?  ? ?  ? ?Balance Overall balance assessment: Needs assistance ?Sitting-balance support: Feet supported, Single extremity supported ?Sitting balance-Leahy Scale: Fair ?  ?  ?Standing balance support: Reliant on assistive device for balance, During functional activity, Bilateral upper extremity supported ?Standing balance-Leahy Scale: Poor ?  ?  ?  ?  ?  ?  ?  ?  ?  ?  ?  ?  ?   ? ? ? ?Pertinent Vitals/Pain Pain Assessment ?Pain Assessment: 0-10 ?Pain Score: 6  ?Pain Location: R hip ?Pain Descriptors / Indicators: Constant, Discomfort, Grimacing, Guarding, Shooting ?Pain Intervention(s): Limited  activity within patient's tolerance, Monitored during session, Premedicated before session, Repositioned  ? ? ?Home Living Family/patient expects to be discharged to:: Private residence ?Living Arrangements: Spouse/significant  other ?Available Help at Discharge: Family ?Type of Home: House ?Home Access: Stairs to enter ?  ?Entrance Stairs-Number of Steps: 3 plus threshold ?  ?Home Layout: One level ?Home Equipment: BSC/3in1 ?   ?  ?Prior Function Prior Level of Function : Independent/Modified Independent ?  ?  ?  ?  ?  ?  ?  ?  ?  ? ? ?Hand Dominance  ? Dominant Hand: Right ? ?  ?Extremity/Trunk Assessment  ? Upper Extremity Assessment ?Upper Extremity Assessment: Defer to OT evaluation ?  ? ?Lower Extremity Assessment ?Lower Extremity Assessment: RLE deficits/detail ?RLE Deficits / Details: ankle WFL, knee and hip AAROM grossly WFL, strength ~ 2+/5 ?  ? ?Cervical / Trunk Assessment ?Cervical / Trunk Assessment: Kyphotic  ?Communication  ? Communication: No difficulties  ?Cognition Arousal/Alertness: Awake/alert ?  ?Overall Cognitive Status: Within Functional Limits for tasks assessed ?  ?  ?  ?  ?  ?  ?  ?  ?  ?  ?  ?  ?  ?  ?  ?  ?General Comments: hx of dementia, wife answering some questions and then stops herself, allowing pt to answer ?  ?  ? ?  ?General Comments   ? ?  ?Exercises    ? ?Assessment/Plan  ?  ?PT Assessment Patient needs continued PT services  ?PT Problem List Decreased strength;Decreased range of motion;Decreased activity tolerance;Decreased mobility;Decreased balance;Decreased knowledge of use of DME;Pain ? ?   ?  ?PT Treatment Interventions DME instruction;Therapeutic exercise;Gait training;Functional mobility training;Therapeutic activities;Patient/family education;Stair training   ? ?PT Goals (Current goals can be found in the Care Plan section)  ?Acute Rehab PT Goals ?Patient Stated Goal: have less pain ?PT Goal Formulation: With patient/family ?Time For Goal Achievement: 03/27/22 ?Potential to Achieve Goals: Good ? ?  ?Frequency Min 4X/week ?  ? ? ?Co-evaluation   ?  ?  ?  ?  ? ? ?  ?AM-PAC PT "6 Clicks" Mobility  ?Outcome Measure Help needed turning from your back to your side while in a flat bed without using  bedrails?: A Lot ?Help needed moving from lying on your back to sitting on the side of a flat bed without using bedrails?: A Lot ?Help needed moving to and from a bed to a chair (including a wheelchair)?: A Lot ?Help needed standing up from a chair using your arms (e.g., wheelchair or bedside chair)?: A Lot ?Help needed to walk in hospital room?: A Lot ?Help needed climbing 3-5 steps with a railing? : Total ?6 Click Score: 11 ? ?  ?End of Session Equipment Utilized During Treatment: Gait belt ?Activity Tolerance: Patient tolerated treatment well;Patient limited by fatigue ?Patient left: with call bell/phone within reach;in chair;with chair alarm set;with family/visitor present ?  ?PT Visit Diagnosis: Unsteadiness on feet (R26.81);Difficulty in walking, not elsewhere classified (R26.2) ?  ? ?Time: 9381-8299 ?PT Time Calculation (min) (ACUTE ONLY): 23 min ? ? ?Charges:   PT Evaluation ?$PT Eval Low Complexity: 1 Low ?PT Treatments ?$Gait Training: 8-22 mins ?  ?   ? ? ?Baxter Flattery, PT ? ?Acute Rehab Dept [facility]) [phone] ?Pager [phone] ? ?03/13/2022 ? ? ?Ottilie Wigglesworth ?03/13/2022, 12:47 PM ? ?

## 2022-03-13 NOTE — Progress Notes (Signed)
?PROGRESS NOTE ? ?CAMARION WEIER JHE:[id number] DOB: July 01, 1941  ? ?PCP: [name], MD ? ?[name] is from: Home. ? ?DOA: 03/11/2022 LOS: 1 ? ?Chief complaints ?Chief Complaint  ?[name] presents with  ? Fall  ? Hip Pain  ?  ? ?Brief Narrative / Interim history: ?81 year old M with PMH of dementia, OSA not on CPAP, A-fib not on anticoagulation, HTN, osteoarthritis, anxiety and depression presenting with right hip pain after accidental fall when he lost his footing while attempting to carry heavy [place] up [place] steps to his home in [place] rain.  No head trauma or LOC.  Admitted for right femoral neck fracture.  Underwent ORIF on 03/12/2022.  ? ?Subjective: ?Seen and examined earlier this morning.  He had some fever to 101.4 earlier this morning.  His room temperature was set to 80 ?F.  Pain fairly controlled.  Staff noted some erythema and swelling around surgical site.  ? ?Objective: ?Vitals:  ? 03/12/22 1959 03/13/22 0413 03/13/22 0527 03/13/22 1424  ?BP: 101/62 105/67  127/68  ?Pulse: 73 82  73  ?Resp: '17 18  18  '$ ?Temp: 98 ?F (36.7 ?C) (!) 101.4 ?F (38.6 ?C) 98 ?F (36.7 ?C) 98.5 ?F (36.9 ?C)  ?TempSrc: Oral Oral Oral Oral  ?SpO2: 95% 93%  94%  ?Weight:      ?Height:      ? ? ?Examination: ? ?GENERAL: No apparent distress.  Nontoxic. ?HEENT: MMM.  Vision and hearing grossly intact.  ?NECK: Supple.  No apparent JVD.  ?RESP:  No IWOB.  Fair aeration bilaterally. ?CVS:  RRR. Heart sounds normal.  ?ABD/GI/GU: BS+. Abd soft, NTND.  Indwelling Foley in place. ?MSK/EXT:  Moves extremities. No apparent deformity. No edema.  ?SKIN: Dressing over right anterior thigh DCI.  Mild erythema and swelling posterior to surgical dressing. ?NEURO: Awake, alert and oriented appropriately.  No apparent focal neuro deficit. ?PSYCH: Calm. Normal affect.  ? ?Procedures:  ?3/18-ORIF of right hip by Dr. Lyla Glassing ? ?Microbiology summarized: ?Surgical MRSA PCR screen negative. ? ?Assessment and Plan: ?* Hip fracture ([place]) ?Traumatic from  accidental fall.  S/p ORIF by Dr. Lyla Glassing on 03/12/2022.  Vitamin D 54.  Some erythema around surgical site.  Isolated fever to 101.4 earlier this morning.  This was in [place] setting of increased room temperature.  ?-WBAT, aspirin 81 mg twice daily for DVT prophylaxis and pain control per Ortho ?-PT/OT evaluation ?-Bowel regimen ?-Discontinue Foley catheter. ? ?Normocytic anemia ?Recent Labs  ?  03/11/22 ?2100 03/12/22 ?1856 03/13/22 ?3149  ?HGB 13.4 12.0* 8.9*  ?Significant drop in Hgb.  Postop blood loss?  Has some swelling and erythema. ?-Recheck CBC this afternoon. ?-Check anemia panel ? ? ?Accidental fall ?-See hip fracture ?-Fall precaution ? ?Cognitive impairment ?Continue home Aricept.   ?-He has mild first-degree AV block.  Monitor for bradycardia ?-Reorientation and delirium precautions ? ?Thrombocytopenia ([place]) ?Recent Labs  ?Lab 03/11/22 ?2100 03/12/22 ?7026 03/13/22 ?3785  ?PLT 147* 142* 121*  ?-Monitor ? ? ?Mood disorder ([place]) ?Stable ?-Continue home lithium and Zoloft. ? ?OSA (obstructive sleep apnea) ?Not on CPAP or oxygen. ? ?Hypertension ?Normotensive for most part. ?-Continue home meds ? ?Osteoarthritis ?Pain control as above ? ?History of atrial fibrillation ?Currently in normal sinus rhythm except for borderline first-degree AVB likely from Aricept.  Not on meds for A-fib. ? ? ?Increased nutrient needs ?Body mass index is 26.44 kg/m?Marland Kitchen ?Nutrition Problem: Increased nutrient needs ?Etiology: post-op healing, hip fracture ?Signs/Symptoms: estimated needs ?Interventions: Ensure Surgery, MVI ?  ?DVT  prophylaxis:  ?SCDs Start: 03/12/22 1227 ?SCDs Start: 03/12/22 0148 ? ?Code Status: Full code ?Family Communication: Updated [name]'s wife at bedside. ?Level of care: Telemetry ?Status is: Inpatient ?Remains inpatient appropriate because: Right hip fracture ? ? ?Final disposition: SNF ? ?Consultants:  ?Orthopedic surgery ? ?Sch Meds:  ?Scheduled Meds: ? aspirin  81 mg Oral BID  ? Chlorhexidine Gluconate  Cloth  6 each Topical Daily  ? docusate sodium  100 mg Oral BID  ? donepezil  10 mg Oral QHS  ? feeding supplement  237 mL Oral BID BM  ? lithium carbonate  450 mg Oral Daily  ? multivitamin with minerals  1 tablet Oral Daily  ? senna  1 tablet Oral BID  ? sertraline  100 mg Oral Daily  ? simvastatin  20 mg Oral Daily  ? ?Continuous Infusions: ? sodium chloride Stopped (03/12/22 3710)  ? sodium chloride Stopped (03/13/22 0420)  ? methocarbamol (ROBAXIN) IV    ? ?PRN Meds:.acetaminophen, alum & mag hydroxide-simeth, diphenhydrAMINE, HYDROcodone-acetaminophen, HYDROcodone-acetaminophen, menthol-cetylpyridinium **OR** phenol, methocarbamol **OR** methocarbamol (ROBAXIN) IV, metoCLOPramide **OR** metoCLOPramide (REGLAN) injection, morphine injection, ondansetron **OR** ondansetron (ZOFRAN) IV, polyethylene glycol, senna-docusate ? ?Antimicrobials: ?Anti-infectives (From admission, onward)  ? ? Start     Dose/Rate Route Frequency Ordered Stop  ? 03/12/22 1400  ceFAZolin (ANCEF) IVPB 2g/100 mL premix       ? 2 g ?200 mL/hr over 30 Minutes Intravenous Every 6 hours 03/12/22 1227 03/12/22 2148  ? 03/12/22 0730  ceFAZolin (ANCEF) IVPB 2g/100 mL premix       ? 2 g ?200 mL/hr over 30 Minutes Intravenous On call to O.R. 03/12/22 6269 03/12/22 0823  ? 03/12/22 0646  ceFAZolin (ANCEF) 2-4 GM/100ML-% IVPB       ?Note to Pharmacy: Dara Lords M: cabinet override  ?    03/12/22 4854 03/12/22 0823  ? ?  ? ? ? ?I have personally reviewed Jason following labs and images: ?CBC: ?Recent Labs  ?Lab 03/11/22 ?2100 03/12/22 ?6270 03/13/22 ?3500  ?WBC 10.6* 8.9 6.6  ?NEUTROABS 9.2*  --   --   ?HGB 13.4 12.0* 8.9*  ?HCT 39.9 36.1* 27.0*  ?MCV 97.6 97.6 100.4*  ?PLT 147* 142* 121*  ? ?BMP &GFR ?Recent Labs  ?Lab 03/11/22 ?2100 03/12/22 ?9381 03/13/22 ?8299  ?NA 136 137 135  ?K 4.5 4.0 3.8  ?CL 106 105 106  ?CO2 21* 24 22  ?GLUCOSE 123* 122* 128*  ?BUN '19 19 23  '$ ?CREATININE 0.93 0.99 1.05  ?CALCIUM 9.4 9.4 8.3*  ?MG  --   --  1.8  ?PHOS  --   --   2.9  ? ?Estimated Creatinine Clearance: 65.5 mL/min (by C-G formula based on SCr of 1.05 mg/dL). ?Liver & Pancreas: ?Recent Labs  ?Lab 03/13/22 ?3716  ?ALBUMIN 3.1*  ? ?No results for input(s): LIPASE, AMYLASE in Jason last 168 hours. ?No results for input(s): AMMONIA in Jason last 168 hours. ?Diabetic: ?Recent Labs  ?  03/12/22 ?9678  ?HGBA1C 5.3  ? ?Recent Labs  ?Lab 03/12/22 ?1725 03/12/22 ?1957 03/12/22 ?2354 03/13/22 ?0410 03/13/22 ?0800  ?GLUCAP 135* 114* 139* 132* 121*  ? ?Cardiac Enzymes: ?Recent Labs  ?Lab 03/13/22 ?9381  ?CKTOTAL 447*  ? ?No results for input(s): PROBNP in Jason last 8760 hours. ?Coagulation Profile: ?No results for input(s): INR, PROTIME in Jason last 168 hours. ?Thyroid Function Tests: ?No results for input(s): TSH, T4TOTAL, FREET4, T3FREE, THYROIDAB in Jason last 72 hours. ?Lipid Profile: ?No results for input(s): CHOL, HDL, LDLCALC,  TRIG, CHOLHDL, LDLDIRECT in Jason last 72 hours. ?Anemia Panel: ?No results for input(s): VITAMINB12, FOLATE, FERRITIN, TIBC, IRON, RETICCTPCT in Jason last 72 hours. ?Urine analysis: ?   ?Component Value Date/Time  ? [place] YELLOW 03/12/2022 0514  ? APPEARANCEUR CLEAR 03/12/2022 0514  ? LABSPEC 1.019 03/12/2022 0514  ? PHURINE 5.0 03/12/2022 0514  ? [place] NEGATIVE 03/12/2022 0514  ? GLUCOSEU NEGATIVE 03/04/2011 0831  ? HGBUR MODERATE (A) 03/12/2022 0514  ? [place] NEGATIVE 03/12/2022 0514  ? KETONESUR 5 (A) 03/12/2022 0514  ? [place] NEGATIVE 03/12/2022 0514  ? UROBILINOGEN 0.2 03/04/2011 0831  ? NITRITE NEGATIVE 03/12/2022 0514  ? LEUKOCYTESUR NEGATIVE 03/12/2022 0514  ? ?Sepsis Labs: ?Invalid input(s): PROCALCITONIN, LACTICIDVEN ? ?Microbiology: ?Recent Results (from Jason past 240 hour(s))  ?Surgical PCR screen     Status: None  ? Collection Time: 03/12/22  4:08 AM  ? Specimen: Nasal Mucosa; Nasal Swab  ?Result Value Ref Range Status  ? MRSA, PCR NEGATIVE NEGATIVE Final  ? Staphylococcus aureus NEGATIVE NEGATIVE Final  ?  Comment: (NOTE) ?[place] Xpert SA  Assay (FDA approved for NASAL specimens in patients 49 ?years of age and older), is one component of a comprehensive ?surveillance program. It is not intended to diagnose infection nor to ?guide or monitor tr

## 2022-03-13 NOTE — Progress Notes (Signed)
Pt had a temperature up to 101.4.  The room temperature was set on 80 earlier in the shift and I had decreased it to 75.  He also had 2 bedspreads on which I removed.  Tylenol given and IS encouraged which he did extremely well with.  Repeat temp was 98.0.  Pt reports pain is 2-3/10 and has not required anything stronger than Tylenol. ? ?Ayesha Mohair BSN RN CMSRN ?03/13/2022, 5:35 AM ? ?

## 2022-03-13 NOTE — Assessment & Plan Note (Addendum)
Recent Labs  ?  03/11/22 ?2100 03/12/22 ?0175 03/13/22 ?1025 03/14/22 ?8527 03/15/22 ?0454  ?HGB 13.4 12.0* 8.9* 8.7* 9.2*  ?EBL of 500 cc documented.  H&H seems to have leveled off 9.2.  Anemia panel with iron deficiency.  Patient and family denies melena or hematochezia. ?-IV ferric gluconate 250 mg on 3/20 and 3/21 ?-Recheck CBC in 1 week ? ?

## 2022-03-13 NOTE — Progress Notes (Signed)
Patient ID: Jason [name], male   DOB: [birth year], 81 y.o.   MRN: [id number] ?Subjective: ?1 Day Post-Op Procedure(s) (LRB): ?TOTAL HIP ARTHROPLASTY ANTERIOR APPROACH (Right) ?  ? ?Patient reports pain as moderate.  Complains mainly of right thigh soreness. ?No significant events over night. ? ?Objective:  ? ?VITALS:   ?Vitals:  ? 03/13/22 0413 03/13/22 0527  ?BP: 105/67   ?Pulse: 82   ?Resp: 18   ?Temp: (!) 101.4 ?F (38.6 ?C) 98 ?F (36.7 ?C)  ?SpO2: 93%   ? ? ?Neurovascular intact ?Incision: dressing C/D/I ? ?LABS ?Recent Labs  ?  03/11/22 ?2100 03/12/22 ?5809  ?HGB 13.4 12.0*  ?HCT 39.9 36.1*  ?WBC 10.6* 8.9  ?PLT 147* 142*  ? ? ?Recent Labs  ?  03/11/22 ?2100 03/12/22 ?9833 03/13/22 ?8250  ?NA 136 137 135  ?K 4.5 4.0 3.8  ?BUN '19 19 23  '$ ?CREATININE 0.93 0.99 1.05  ?GLUCOSE 123* 122* 128*  ? ? ?No results for input(s): LABPT, INR in the last 72 hours. ? ?CLINICAL DATA:  Right hip replacement. ?  ?EXAM: ?PORTABLE PELVIS 1-2 VIEWS: 03/12/22 ?  ?COMPARISON:  CT right hip from yesterday. ?  ?FINDINGS: ?The right hip demonstrates a total arthroplasty without evidence of ?hardware failure or complication. There is no fracture or ?dislocation. The alignment is anatomic. Post-surgical changes noted ?in the surrounding soft tissues. ?  ?IMPRESSION: ?1. Interval right total hip arthroplasty without evidence of acute ?postoperative complication. ?  ?  ?Electronically Signed ?  By: Titus Dubin M.D. ? ?Assessment/Plan: ?1 Day Post-Op Procedure(s) (LRB): ?TOTAL HIP ARTHROPLASTY ANTERIOR APPROACH (Right) ? ? ?Advance diet ?Up with therapy - WBAT RLE ?Disposition pending therapy assessment but hope for safe home discharge  ?DVT prophylaxis - 81 mg ASA BID ?Pain meds ordered ?

## 2022-03-13 NOTE — Assessment & Plan Note (Deleted)
Resolved

## 2022-03-14 ENCOUNTER — Other Ambulatory Visit: Payer: Self-pay

## 2022-03-14 ENCOUNTER — Encounter (HOSPITAL_COMMUNITY): Payer: Self-pay | Admitting: Orthopedic Surgery

## 2022-03-14 DIAGNOSIS — R4189 Other symptoms and signs involving cognitive functions and awareness: Secondary | ICD-10-CM | POA: Diagnosis not present

## 2022-03-14 DIAGNOSIS — S72001A Fracture of unspecified part of neck of right femur, initial encounter for closed fracture: Secondary | ICD-10-CM | POA: Diagnosis not present

## 2022-03-14 DIAGNOSIS — W19XXXA Unspecified fall, initial encounter: Secondary | ICD-10-CM | POA: Diagnosis not present

## 2022-03-14 DIAGNOSIS — I10 Essential (primary) hypertension: Secondary | ICD-10-CM | POA: Diagnosis not present

## 2022-03-14 DIAGNOSIS — D62 Acute posthemorrhagic anemia: Secondary | ICD-10-CM

## 2022-03-14 LAB — CBC
HCT: 27.1 % — ABNORMAL LOW (ref 39.0–52.0)
Hemoglobin: 8.7 g/dL — ABNORMAL LOW (ref 13.0–17.0)
MCH: 32.3 pg (ref 26.0–34.0)
MCHC: 32.1 g/dL (ref 30.0–36.0)
MCV: 100.7 fL — ABNORMAL HIGH (ref 80.0–100.0)
Platelets: 126 10*3/uL — ABNORMAL LOW (ref 150–400)
RBC: 2.69 MIL/uL — ABNORMAL LOW (ref 4.22–5.81)
RDW: 12.7 % (ref 11.5–15.5)
WBC: 7.9 10*3/uL (ref 4.0–10.5)
nRBC: 0 % (ref 0.0–0.2)

## 2022-03-14 LAB — IRON AND TIBC
Iron: 18 ug/dL — ABNORMAL LOW (ref 45–182)
Saturation Ratios: 7 % — ABNORMAL LOW (ref 17.9–39.5)
TIBC: 250 ug/dL (ref 250–450)
UIBC: 232 ug/dL

## 2022-03-14 LAB — GLUCOSE, CAPILLARY
Glucose-Capillary: 104 mg/dL — ABNORMAL HIGH (ref 70–99)
Glucose-Capillary: 107 mg/dL — ABNORMAL HIGH (ref 70–99)
Glucose-Capillary: 116 mg/dL — ABNORMAL HIGH (ref 70–99)
Glucose-Capillary: 125 mg/dL — ABNORMAL HIGH (ref 70–99)
Glucose-Capillary: 140 mg/dL — ABNORMAL HIGH (ref 70–99)
Glucose-Capillary: 156 mg/dL — ABNORMAL HIGH (ref 70–99)

## 2022-03-14 LAB — MAGNESIUM: Magnesium: 2.1 mg/dL (ref 1.7–2.4)

## 2022-03-14 LAB — RENAL FUNCTION PANEL
Albumin: 3 g/dL — ABNORMAL LOW (ref 3.5–5.0)
Anion gap: 5 (ref 5–15)
BUN: 21 mg/dL (ref 8–23)
CO2: 24 mmol/L (ref 22–32)
Calcium: 8.8 mg/dL — ABNORMAL LOW (ref 8.9–10.3)
Chloride: 107 mmol/L (ref 98–111)
Creatinine, Ser: 0.86 mg/dL (ref 0.61–1.24)
GFR, Estimated: >60 mL/min (ref >60)
Glucose, Bld: 124 mg/dL — ABNORMAL HIGH (ref 70–99)
Phosphorus: 2.2 mg/dL — ABNORMAL LOW (ref 2.5–4.6)
Potassium: 3.9 mmol/L (ref 3.5–5.1)
Sodium: 136 mmol/L (ref 135–145)

## 2022-03-14 LAB — VITAMIN B12: Vitamin B-12: 1126 pg/mL — ABNORMAL HIGH (ref 180–914)

## 2022-03-14 LAB — RETICULOCYTES
Immature Retic Fract: 20.4 % — ABNORMAL HIGH (ref 2.3–15.9)
RBC.: 2.65 MIL/uL — ABNORMAL LOW (ref 4.22–5.81)
Retic Count, Absolute: 47.2 10*3/uL (ref 19.0–186.0)
Retic Ct Pct: 1.8 % (ref 0.4–3.1)

## 2022-03-14 LAB — FERRITIN: Ferritin: 192 ng/mL (ref 24–336)

## 2022-03-14 LAB — FOLATE: Folate: 36.2 ng/mL (ref >5.9)

## 2022-03-14 MED ORDER — POLYETHYLENE GLYCOL 3350 17 G PO PACK
17.0000 g | PACK | Freq: Two times a day (BID) | ORAL | Status: DC | PRN
  Administered 2022-03-14: 17 g via ORAL
  Filled 2022-03-14: qty 1

## 2022-03-14 MED ORDER — SODIUM CHLORIDE 0.9 % IV SOLN
250.0000 mg | Freq: Once | INTRAVENOUS | Status: AC
  Administered 2022-03-14: 250 mg via INTRAVENOUS
  Filled 2022-03-14: qty 20

## 2022-03-14 NOTE — Progress Notes (Signed)
Transition of Care (TOC) -30 day Note   ?  ?  ?Patient Details  ?Name: Jason [name] ?MRN: [id number] ?Date of Birth: 1941/11/21 ?  ?MUST ID: [id number]  ?  ?To Whom it May Concern: ?  ?Please be advised that the above patient will require a short-term nursing home stay, anticipated 30 days or less rehabilitation and strengthening. The plan is for return home ?

## 2022-03-14 NOTE — Progress Notes (Signed)
Physical Therapy Treatment ?Patient Details ?Name: Jason [name] ?MRN: [id number] ?DOB: 18-Dec-1941 ?Today's Date: 03/14/2022 ? ? ?History of Present Illness patient is a 81 year old male who presented to the hosptial with R hip pain after fall at home on steps while carrying items. patient was found to have R femoral neck fx. on 3/18 patient underwent R THA anterior. PMH: dementia, OSA not on CPAP, A-fib not on anticoagulation, HTN, osteoarthritis, anxiety and depression ? ?  ?PT Comments  ? ? POD # 2 ?Assisted OOB to amb required increased time and effort.  General Gait Details: + 2 asisst such that recliner was following and 75% VC's on proper walker to self distance and upright posture.  Distance limited by effort and fatigue.  Tolerated an increased distance of 17 feet. Performed a few TE's AAROM then applied ICE. ?Pt will need ST Rehab at SNF prior to safely returning home. ?  ?Recommendations for follow up therapy are one component of a multi-disciplinary discharge planning process, led by the attending physician.  Recommendations may be updated based on patient status, additional functional criteria and insurance authorization. ? ?Follow Up Recommendations ? Skilled nursing-short term rehab (<3 hours/day) ?  ?  ?Assistance Recommended at Discharge Frequent or constant Supervision/Assistance  ?Patient can return home with the following Help with stairs or ramp for entrance;A little help with bathing/dressing/bathroom;A little help with walking and/or transfers;Assistance with cooking/housework;Assist for transportation ?  ?Equipment Recommendations ? Rolling walker (2 wheels)  ?  ?Recommendations for Other Services   ? ? ?  ?Precautions / Restrictions Precautions ?Precautions: Fall ?Restrictions ?Weight Bearing Restrictions: No ?RLE Weight Bearing: Weight bearing as tolerated  ?  ? ?Mobility ? Bed Mobility ?  ?Bed Mobility: Supine to Sit ?  ?  ?Supine to sit: Max assist, HOB elevated ?  ?  ?General bed mobility  comments: increased time and use of bed pad to complete scooting to EOB ?  ? ?Transfers ?Overall transfer level: Needs assistance ?  ?Transfers: Sit to/from Stand ?Sit to Stand: Mod assist, +2 physical assistance, +2 safety/equipment ?  ?  ?  ?  ?  ?General transfer comment: assist with anterior-superior wt shift, cues for incr knee flexion and hand placement ?  ? ?Ambulation/Gait ?Ambulation/Gait assistance: Min assist, Mod assist ?Gait Distance (Feet): 17 Feet ?Assistive device: Rolling walker (2 wheels) ?Gait Pattern/deviations: Step-to pattern, Decreased stance time - right ?Gait velocity: decreased ?  ?  ?General Gait Details: + 2 asisst such that recliner was following and 75% VC's on proper walker to self distance and upright posture.  Distance limited by effort and fatigue.  Tolerated an increased distance of 17 feet. ? ? ?Stairs ?  ?  ?  ?  ?  ? ? ?Wheelchair Mobility ?  ? ?Modified Rankin (Stroke Patients Only) ?  ? ? ?  ?Balance   ?  ?  ?  ?  ?  ?  ?  ?  ?  ?  ?  ?  ?  ?  ?  ?  ?  ?  ?  ? ?  ?Cognition Arousal/Alertness: Awake/alert ?  ?Overall Cognitive Status: Within Functional Limits for tasks assessed ?  ?  ?  ?  ?  ?  ?  ?  ?  ?  ?  ?  ?  ?  ?  ?  ?General Comments: hx of dementia, wife answering some questions and then stops herself, allowing pt to answer ?  ?  ? ?  ?  Exercises   ? ?  ?General Comments   ?  ?  ? ?Pertinent Vitals/Pain Pain Assessment ?Pain Assessment: 0-10 ?Pain Score: 7  ?Pain Location: R hip ?Pain Descriptors / Indicators: Constant, Discomfort, Grimacing, Guarding, Shooting ?Pain Intervention(s): Monitored during session, Patient requesting pain meds-RN notified, Repositioned, Ice applied  ? ? ?Home Living   ?  ?  ?  ?  ?  ?  ?  ?  ?  ?   ?  ?Prior Function    ?  ?  ?   ? ?PT Goals (current goals can now be found in the care plan section) Progress towards PT goals: Progressing toward goals ? ?  ?Frequency ? ? ? Min 4X/week ? ? ? ?  ?PT Plan Current plan remains appropriate   ? ? ?Co-evaluation   ?  ?  ?  ?  ? ?  ?AM-PAC PT "6 Clicks" Mobility   ?Outcome Measure ? Help needed turning from your back to your side while in a flat bed without using bedrails?: A Lot ?Help needed moving from lying on your back to sitting on the side of a flat bed without using bedrails?: A Lot ?Help needed moving to and from a bed to a chair (including a wheelchair)?: A Lot ?Help needed standing up from a chair using your arms (e.g., wheelchair or bedside chair)?: A Lot ?Help needed to walk in hospital room?: A Lot ?Help needed climbing 3-5 steps with a railing? : Total ?6 Click Score: 11 ? ?  ?End of Session Equipment Utilized During Treatment: Gait belt ?Activity Tolerance: Patient tolerated treatment well;Patient limited by fatigue ?Patient left: with call bell/phone within reach;in chair;with chair alarm set;with family/visitor present ?Nurse Communication: Mobility status ?PT Visit Diagnosis: Unsteadiness on feet (R26.81);Difficulty in walking, not elsewhere classified (R26.2) ?  ? ? ?Time: 0092-3300 ?PT Time Calculation (min) (ACUTE ONLY): 33 min ? ?Charges:  $Gait Training: 8-22 mins ?$Therapeutic Exercise: 8-22 mins          ?          ?Rica Koyanagi  PTA ?Acute  Rehabilitation Services ?Pager      [phone] ?Office      [phone] ? ?

## 2022-03-14 NOTE — [place] FL2 (Signed)
?[place] MEDICAID FL2 LEVEL OF CARE SCREENING TOOL  ?  ? ?IDENTIFICATION  ?Patient Name: ?Jason [name] Birthdate: [birth year] Sex: male Admission Date (Current Location): ?03/11/2022  ?[place] and [place] Number: ? [place] ?  Facility and Address:  ?[facility],  [place], [place] ?     Provider Number: ?[id number]  ?Attending Physician Name and Address:  ?Mercy Riding, MD ? Relative Name and Phone Number:  ?  ?   ?Current Level of Care: ?Hospital Recommended Level of Care: ?[place] Prior Approval Number: ?  ? ?Date Approved/Denied: ?  PASRR Number: ?Pending ? ?Discharge Plan: ?SNF ?  ? ?Current Diagnoses: ?Patient Active Problem List  ? Diagnosis Date Noted  ? Normocytic anemia 03/13/2022  ? Thrombocytopenia ([place]) 03/13/2022  ? Hip fracture ([place]) 03/12/2022  ? Accidental fall 03/12/2022  ? Cognitive impairment 03/12/2022  ? Personal history of colonic polyps 04/21/2021  ? Pressure ulcer of left foot 05/14/2020  ? Pressure ulcer of right foot 05/14/2020  ? Pain in left foot 05/08/2020  ? Pain in right foot 05/08/2020  ? Bilateral impacted cerumen 03/10/2020  ? Bilateral sensorineural hearing loss 03/10/2020  ? GAD (generalized anxiety disorder) 12/23/2018  ? At high risk for falls 09/03/2018  ? Mood disorder ([place]) 08/18/2015  ? OSA (obstructive sleep apnea) 01/19/2015  ? Vitamin D deficiency 01/07/2013  ? Need for prophylactic vaccination and inoculation against influenza 11/14/2011  ? Hypertension 11/14/2011  ? Osteoarthritis 11/20/2009  ? PSYCHIATRIC DISORDER 07/16/2008  ? History of pneumonia 07/16/2008  ? History of atrial fibrillation   ? HYPERCHOLESTEROLEMIA   ? ? ?Orientation RESPIRATION BLADDER Height & Weight   ?  ?Self, Time, Situation, Place ? Normal Continent Weight: 90.9 kg ?Height:  '6\' 1"'$  (185.4 cm)  ?BEHAVIORAL SYMPTOMS/MOOD NEUROLOGICAL BOWEL NUTRITION STATUS  ?    Continent Diet (Regular)  ?AMBULATORY STATUS COMMUNICATION OF NEEDS Skin   ?Extensive  Assist Verbally Skin abrasions, Bruising (R knee, R ankle and L foot) ?  ?  ?  ?    ?     ?     ? ? ?Personal Care Assistance Level of Assistance  ?Bathing, Feeding, Dressing Bathing Assistance: Limited assistance ?Feeding assistance: Limited assistance ?Dressing Assistance: Limited assistance ?   ? ?Functional Limitations Info  ?Sight, Hearing, Speech Sight Info: Adequate ?Hearing Info: Adequate ?Speech Info: Adequate  ? ? ?SPECIAL CARE FACTORS FREQUENCY  ?PT (By licensed PT), OT (By licensed OT)   ?  ?PT Frequency: 5 x weekly ?OT Frequency: 5 x weekly ?  ?  ?  ?   ? ? ?Contractures Contractures Info: Not present  ? ? ?Additional Factors Info  ?Code Status, Allergies Code Status Info: Full ?Allergies Info: None known ?  ?  ?  ?   ? ?Current Medications (03/14/2022):  This is the current hospital active medication list ?Current Facility-Administered Medications  ?Medication Dose Route Frequency Provider Last Rate Last Admin  ? acetaminophen (TYLENOL) tablet 325-650 mg  325-650 mg Oral Q6H PRN Mercy Riding, MD   650 mg at 03/13/22 0420  ? alum & mag hydroxide-simeth (MAALOX/MYLANTA) 200-200-20 MG/5ML suspension 30 mL  30 mL Oral Q4H PRN Swinteck, Aaron Edelman, MD      ? aspirin chewable tablet 81 mg  81 mg Oral BID Rod Can, MD   81 mg at 03/14/22 0950  ? Chlorhexidine Gluconate Cloth 2 % PADS 6 each  6 each Topical Daily Mercy Riding, MD  6 each at 03/13/22 9323  ? diphenhydrAMINE (BENADRYL) 12.5 MG/5ML elixir 12.5-25 mg  12.5-25 mg Oral Q4H PRN Swinteck, Aaron Edelman, MD      ? docusate sodium (COLACE) capsule 100 mg  100 mg Oral BID Rod Can, MD   100 mg at 03/14/22 0951  ? donepezil (ARICEPT) tablet 10 mg  10 mg Oral QHS Myles Rosenthal A, MD   10 mg at 03/13/22 2026  ? feeding supplement (ENSURE SURGERY) liquid 237 mL  237 mL Oral BID BM Gonfa, Taye T, MD      ? ferric gluconate (FERRLECIT) 250 mg in sodium chloride 0.9 % 250 mL IVPB  250 mg Intravenous Once Mercy Riding, MD      ? HYDROcodone-acetaminophen  (NORCO) 7.5-325 MG per tablet 1-2 tablet  1-2 tablet Oral Q4H PRN Swinteck, Aaron Edelman, MD      ? HYDROcodone-acetaminophen (NORCO/VICODIN) 5-325 MG per tablet 1-2 tablet  1-2 tablet Oral Q4H PRN Rod Can, MD   1 tablet at 03/14/22 [phone]  ? lithium carbonate capsule 450 mg  450 mg Oral Daily Clance Boll, MD   450 mg at 03/14/22 2202  ? menthol-cetylpyridinium (CEPACOL) lozenge 3 mg  1 lozenge Oral PRN Swinteck, Aaron Edelman, MD      ? Or  ? phenol (CHLORASEPTIC) mouth spray 1 spray  1 spray Mouth/Throat PRN Swinteck, Aaron Edelman, MD      ? methocarbamol (ROBAXIN) tablet 500 mg  500 mg Oral Q6H PRN Swinteck, Aaron Edelman, MD      ? Or  ? methocarbamol (ROBAXIN) 500 mg in dextrose 5 % 50 mL IVPB  500 mg Intravenous Q6H PRN Swinteck, Aaron Edelman, MD      ? metoCLOPramide (REGLAN) tablet 5-10 mg  5-10 mg Oral Q8H PRN Swinteck, Aaron Edelman, MD      ? Or  ? metoCLOPramide (REGLAN) injection 5-10 mg  5-10 mg Intravenous Q8H PRN Swinteck, Aaron Edelman, MD      ? morphine (PF) 2 MG/ML injection 0.5-1 mg  0.5-1 mg Intravenous Q2H PRN Rod Can, MD   1 mg at 03/13/22 1853  ? multivitamin with minerals tablet 1 tablet  1 tablet Oral Daily Mercy Riding, MD   1 tablet at 03/14/22 0951  ? ondansetron (ZOFRAN) tablet 4 mg  4 mg Oral Q6H PRN Swinteck, Aaron Edelman, MD      ? Or  ? ondansetron (ZOFRAN) injection 4 mg  4 mg Intravenous Q6H PRN Swinteck, Aaron Edelman, MD      ? polyethylene glycol (MIRALAX / GLYCOLAX) packet 17 g  17 g Oral Daily PRN Swinteck, Aaron Edelman, MD      ? senna (SENOKOT) tablet 8.6 mg  1 tablet Oral BID Rod Can, MD   8.6 mg at 03/14/22 0950  ? senna-docusate (Senokot-S) tablet 1 tablet  1 tablet Oral QHS PRN Clance Boll, MD      ? sertraline (ZOLOFT) tablet 100 mg  100 mg Oral Daily Myles Rosenthal A, MD   100 mg at 03/14/22 0950  ? simvastatin (ZOCOR) tablet 20 mg  20 mg Oral Daily Myles Rosenthal A, MD   20 mg at 03/14/22 5427  ? ? ? ?Discharge Medications: ?Please see discharge summary for a list of discharge  medications. ? ?Relevant Imaging Results: ? ?Relevant Lab Results: ? ? ?Additional Information ?Covid vaccinations x 3  SS# [id number] ? ?Ngoc Daughtridge, Marjie Skiff, RN ? ? ? ? ?

## 2022-03-14 NOTE — Progress Notes (Signed)
?PROGRESS NOTE ? ?Jason [name] RKY:[id number] DOB: 09-Jun-1941  ? ?PCP: [name], MD ? ?Patient is from: Home. ? ?DOA: 03/11/2022 LOS: 2 ? ?Chief complaints ?Chief Complaint  ?Patient presents with  ? Fall  ? Hip Pain  ?  ? ?Brief Narrative / Interim history: ?81 year old M with PMH of dementia, OSA not on CPAP, A-fib not on anticoagulation, HTN, osteoarthritis, anxiety and depression presenting with right hip pain after accidental fall when he lost his footing while attempting to carry heavy [place] up the steps to his home in the rain.  No head trauma or LOC.  Admitted for right femoral neck fracture.  Underwent ORIF on 03/12/2022.  Hgb dropped from 13.4-8.7.  Had about 500 cc surgical blood loss documented.  No further bleeding.  Therapy recommended SNF ?  ? ?Subjective: ?Seen and examined earlier this morning.  No major events overnight of this morning.  Pain fairly controlled.  Denies chest pain, dyspnea, cough, GI or UTI symptoms.  Has not had a bowel movement yet. ? ?Objective: ?Vitals:  ? 03/13/22 0527 03/13/22 1424 03/13/22 2000 03/14/22 0418  ?BP:  127/68 (!) 121/56 120/72  ?Pulse:  73 76 73  ?Resp:  18  18  ?Temp: 98 ?F (36.7 ?C) 98.5 ?F (36.9 ?C) 99.9 ?F (37.7 ?C) 98.9 ?F (37.2 ?C)  ?TempSrc: Oral Oral Oral Oral  ?SpO2:  94% 91% 95%  ?Weight:      ?Height:      ? ? ?Examination: ? ?GENERAL: No apparent distress.  Nontoxic. ?HEENT: MMM.  Vision and hearing grossly intact.  ?NECK: Supple.  No apparent JVD.  ?RESP:  No IWOB.  Fair aeration bilaterally. ?CVS:  RRR. Heart sounds normal.  ?ABD/GI/GU: BS+. Abd soft, NTND.  ?MSK/EXT:  Moves extremities. No apparent deformity. No edema.  ?SKIN: Dressing over surgical site DCI.  Slight bruise posteriorly. ?NEURO: Awake and alert. Oriented fairly.  No apparent focal neuro deficit. ?PSYCH: Calm. Normal affect.  ? ?Procedures:  ?3/18-ORIF of right hip by Dr. Lyla Glassing ? ?Microbiology summarized: ?Surgical MRSA PCR screen negative. ? ?Assessment and Plan: ?* Hip  fracture ([place]) ?Traumatic from accidental fall.  S/p ORIF by Dr. Lyla Glassing on 03/12/2022.  Vitamin D 54.  Some erythema around surgical site.  Isolated fever to 101.4 earlier this morning.  This was in the setting of increased room temperature.  ?-WBAT, aspirin 81 mg twice daily for DVT prophylaxis and pain control per Ortho ?-Bowel regimen  ?-Therapy recommended SNF. ? ?Acute blood loss as cause of postoperative anemia ?Recent Labs  ?  03/11/22 ?2100 03/12/22 ?3151 03/13/22 ?7616 03/14/22 ?0737  ?HGB 13.4 12.0* 8.9* 8.7*  ?EBL of 500 cc documented.  H&H seems to have leveled off at 8.7.  Anemia panel with iron deficiency.  Patient and family denies melena or hematochezia. ?-IV ferric gluconate 250 mg on 3/20 and 3/21 ?-Monitor H&H ? ? ?Accidental fall ?-See hip fracture ?-Fall precaution ? ?Cognitive impairment ?Continue home Aricept.   ?-He has mild first-degree AV block.  Monitor for bradycardia ?-Reorientation and delirium precautions ? ?Thrombocytopenia ([place]) ?Recent Labs  ?Lab 03/11/22 ?2100 03/12/22 ?1062 03/13/22 ?6948 03/14/22 ?5462  ?PLT 147* 142* 121* 126*  ?-Monitor ? ? ?Mood disorder ([place]) ?Stable ?-Continue home lithium and Zoloft. ? ?OSA (obstructive sleep apnea) ?Not on CPAP or oxygen. ? ?Hypertension ?Normotensive for most part. ?-Continue home meds ? ?Osteoarthritis ?Pain control as above ? ?History of atrial fibrillation ?Currently in normal sinus rhythm except for borderline first-degree AVB likely from  Aricept.  Not on meds for A-fib. ? ? ?Increased nutrient needs ?Body mass index is 26.44 kg/m?Marland Kitchen ?Nutrition Problem: Increased nutrient needs ?Etiology: post-op healing, hip fracture ?Signs/Symptoms: estimated needs ?Interventions: Ensure Surgery, MVI ?  ?DVT prophylaxis:  ?SCDs Start: 03/12/22 1227 ?SCDs Start: 03/12/22 0148 ? ?Code Status: Full code ?Family Communication: Updated patient's wife at bedside. ?Level of care: Telemetry ?Status is: Inpatient ?Remains inpatient appropriate because: Right  hip fracture ? ? ?Final disposition: SNF ? ?Consultants:  ?Orthopedic surgery ? ?Sch Meds:  ?Scheduled Meds: ? aspirin  81 mg Oral BID  ? Chlorhexidine Gluconate Cloth  6 each Topical Daily  ? docusate sodium  100 mg Oral BID  ? donepezil  10 mg Oral QHS  ? feeding supplement  237 mL Oral BID BM  ? lithium carbonate  450 mg Oral Daily  ? multivitamin with minerals  1 tablet Oral Daily  ? senna  1 tablet Oral BID  ? sertraline  100 mg Oral Daily  ? simvastatin  20 mg Oral Daily  ? ?Continuous Infusions: ? ferric gluconate (FERRLECIT) IVPB 250 mg (03/14/22 1136)  ? methocarbamol (ROBAXIN) IV    ? ?PRN Meds:.acetaminophen, alum & mag hydroxide-simeth, diphenhydrAMINE, HYDROcodone-acetaminophen, HYDROcodone-acetaminophen, menthol-cetylpyridinium **OR** phenol, methocarbamol **OR** methocarbamol (ROBAXIN) IV, metoCLOPramide **OR** metoCLOPramide (REGLAN) injection, morphine injection, ondansetron **OR** ondansetron (ZOFRAN) IV, polyethylene glycol, senna-docusate ? ?Antimicrobials: ?Anti-infectives (From admission, onward)  ? ? Start     Dose/Rate Route Frequency Ordered Stop  ? 03/12/22 1400  ceFAZolin (ANCEF) IVPB 2g/100 mL premix       ? 2 g ?200 mL/hr over 30 Minutes Intravenous Every 6 hours 03/12/22 1227 03/12/22 2148  ? 03/12/22 0730  ceFAZolin (ANCEF) IVPB 2g/100 mL premix       ? 2 g ?200 mL/hr over 30 Minutes Intravenous On call to O.R. 03/12/22 4259 03/12/22 0823  ? 03/12/22 0646  ceFAZolin (ANCEF) 2-4 GM/100ML-% IVPB       ?Note to Pharmacy: Dara Lords M: cabinet override  ?    03/12/22 5638 03/12/22 0823  ? ?  ? ? ? ?I have personally reviewed the following labs and images: ?CBC: ?Recent Labs  ?Lab 03/11/22 ?2100 03/12/22 ?7564 03/13/22 ?3329 03/14/22 ?5188  ?WBC 10.6* 8.9 6.6 7.9  ?NEUTROABS 9.2*  --   --   --   ?HGB 13.4 12.0* 8.9* 8.7*  ?HCT 39.9 36.1* 27.0* 27.1*  ?MCV 97.6 97.6 100.4* 100.7*  ?PLT 147* 142* 121* 126*  ? ?BMP &GFR ?Recent Labs  ?Lab 03/11/22 ?2100 03/12/22 ?4166 03/13/22 ?0630  03/14/22 ?1601  ?NA 136 137 135 136  ?K 4.5 4.0 3.8 3.9  ?CL 106 105 106 107  ?CO2 21* '24 22 24  '$ ?GLUCOSE 123* 122* 128* 124*  ?BUN '19 19 23 21  '$ ?CREATININE 0.93 0.99 1.05 0.86  ?CALCIUM 9.4 9.4 8.3* 8.8*  ?MG  --   --  1.8 2.1  ?PHOS  --   --  2.9 2.2*  ? ?Estimated Creatinine Clearance: 80 mL/min (by C-G formula based on SCr of 0.86 mg/dL). ?Liver & Pancreas: ?Recent Labs  ?Lab 03/13/22 ?0932 03/14/22 ?3557  ?ALBUMIN 3.1* 3.0*  ? ?No results for input(s): LIPASE, AMYLASE in the last 168 hours. ?No results for input(s): AMMONIA in the last 168 hours. ?Diabetic: ?Recent Labs  ?  03/12/22 ?3220  ?HGBA1C 5.3  ? ?Recent Labs  ?Lab 03/13/22 ?2002 03/14/22 ?0031 03/14/22 ?0421 03/14/22 ?2542 03/14/22 ?1158  ?GLUCAP 124* 156* 140* 104* 107*  ? ?Cardiac Enzymes: ?Recent  Labs  ?Lab 03/13/22 ?8756  ?CKTOTAL 447*  ? ?No results for input(s): PROBNP in the last 8760 hours. ?Coagulation Profile: ?No results for input(s): INR, PROTIME in the last 168 hours. ?Thyroid Function Tests: ?No results for input(s): TSH, T4TOTAL, FREET4, T3FREE, THYROIDAB in the last 72 hours. ?Lipid Profile: ?No results for input(s): CHOL, HDL, LDLCALC, TRIG, CHOLHDL, LDLDIRECT in the last 72 hours. ?Anemia Panel: ?Recent Labs  ?  03/14/22 ?4332 03/14/22 ?9518  ?ACZYSAYT01 1,126*  --   ?FOLATE 36.2  --   ?FERRITIN 192  --   ?TIBC 250  --   ?IRON 18*  --   ?RETICCTPCT  --  1.8  ? ?Urine analysis: ?   ?Component Value Date/Time  ? [place] YELLOW 03/12/2022 0514  ? APPEARANCEUR CLEAR 03/12/2022 0514  ? LABSPEC 1.019 03/12/2022 0514  ? PHURINE 5.0 03/12/2022 0514  ? [place] NEGATIVE 03/12/2022 0514  ? GLUCOSEU NEGATIVE 03/04/2011 0831  ? HGBUR MODERATE (A) 03/12/2022 0514  ? [place] NEGATIVE 03/12/2022 0514  ? KETONESUR 5 (A) 03/12/2022 0514  ? [place] NEGATIVE 03/12/2022 0514  ? UROBILINOGEN 0.2 03/04/2011 0831  ? NITRITE NEGATIVE 03/12/2022 0514  ? LEUKOCYTESUR NEGATIVE 03/12/2022 0514  ? ?Sepsis Labs: ?Invalid input(s): PROCALCITONIN,  LACTICIDVEN ? ?Microbiology: ?Recent Results (from the past 240 hour(s))  ?Surgical PCR screen     Status: None  ? Collection Time: 03/12/22  4:08 AM  ? Specimen: Nasal Mucosa; Nasal Swab  ?Result Value Ref Range Sta

## 2022-03-14 NOTE — TOC Initial Note (Addendum)
Transition of Care (TOC) - Initial/Assessment Note  ? ? ?Patient Details  ?Name: Jason [name] ?MRN: [id number] ?Date of Birth: 12/06/1941 ? ?Transition of Care (TOC) CM/SW Contact:    ?Cashton Hosley, Marjie Skiff, RN ?Phone Number: ?03/14/2022, 11:33 AM ? ?Clinical Narrative:                 ?Spoke with pt and wife for dc planning. Physical therapy recommendations reviewed. Pt and wife agreeable to SNF placement. Permission received to fax out FL2.  ? ?Information submitted to pasrr. Additional information requested. Will send info as requested. ? ?Pasrr: [phone] E ? ?1500 Addendum: SNF bed offers with Medicare star ratings provided to wife in the pt room. TOC will check back in for bed choice tomorrow. ? ?Expected Discharge Plan: [place] ?Barriers to Discharge: Continued Medical Work up ? ? ?Patient Goals and CMS Choice ?Patient states their goals for this hospitalization and ongoing recovery are:: To go home ?  ?  ? ?Expected Discharge Plan and Services ?Expected Discharge Plan: [place] ?  ?Discharge Planning Services: CM Consult ?  ?  ?Prior Living Arrangements/Services ?  ?Lives with:: Spouse ?Patient language and need for interpreter reviewed:: Yes ?       ?Need for Family Participation in Patient Care: Yes (Comment) ?Care giver support system in place?: Yes (comment) ?  ?Criminal Activity/Legal Involvement Pertinent to Current Situation/Hospitalization: No - Comment as needed ? ?Activities of Daily Living ?Home Assistive Devices/Equipment: None ?ADL Screening (condition at time of admission) ?Patient's cognitive ability adequate to safely complete daily activities?: Yes ?Is the patient deaf or have difficulty hearing?: No ?Does the patient have difficulty seeing, even when wearing glasses/contacts?: No ?Does the patient have difficulty concentrating, remembering, or making decisions?: No ?Patient able to express need for assistance with ADLs?: Yes ?Does the patient have difficulty  dressing or bathing?: Yes ?Independently performs ADLs?: No ?Communication: Independent ?Dressing (OT): Needs assistance ?Is this a change from baseline?: Change from baseline, expected to last >3 days ?Grooming: Needs assistance ?Is this a change from baseline?: Change from baseline, expected to last >3 days ?Feeding: Independent ?Bathing: Needs assistance ?Is this a change from baseline?: Change from baseline, expected to last >3 days ?Toileting: Needs assistance ?Is this a change from baseline?: Change from baseline, expected to last >3days ?In/Out Bed: Dependent ?Is this a change from baseline?: Change from baseline, expected to last >3 days ?Walks in Home: Needs assistance ?Is this a change from baseline?: Change from baseline, expected to last >3 days ?Does the patient have difficulty walking or climbing stairs?: Yes ?Weakness of Legs: Right ?Weakness of Arms/Hands: None ? ? ?Emotional Assessment ?Appearance:: Appears stated age ?Attitude/Demeanor/Rapport: Engaged ?Affect (typically observed): Calm ?Orientation: : Oriented to Self, Oriented to Place, Oriented to  Time, Oriented to Situation ?Alcohol / Substance Use: Not Applicable ?Psych Involvement: No (comment) ? ?Admission diagnosis:  Hip fracture ([place]) [S72.009A] ?Closed fracture of neck of right femur, initial encounter ([place]) [S72.001A] ?Patient Active Problem List  ? Diagnosis Date Noted  ? Normocytic anemia 03/13/2022  ? Thrombocytopenia ([place]) 03/13/2022  ? Hip fracture ([place]) 03/12/2022  ? Accidental fall 03/12/2022  ? Cognitive impairment 03/12/2022  ? Personal history of colonic polyps 04/21/2021  ? Pressure ulcer of left foot 05/14/2020  ? Pressure ulcer of right foot 05/14/2020  ? Pain in left foot 05/08/2020  ? Pain in right foot 05/08/2020  ? Bilateral impacted cerumen 03/10/2020  ? Bilateral sensorineural hearing loss 03/10/2020  ? GAD (generalized  anxiety disorder) 12/23/2018  ? At high risk for falls 09/03/2018  ? Mood disorder ([place]) 08/18/2015   ? OSA (obstructive sleep apnea) 01/19/2015  ? Vitamin D deficiency 01/07/2013  ? Need for prophylactic vaccination and inoculation against influenza 11/14/2011  ? Hypertension 11/14/2011  ? Osteoarthritis 11/20/2009  ? PSYCHIATRIC DISORDER 07/16/2008  ? History of pneumonia 07/16/2008  ? History of atrial fibrillation   ? HYPERCHOLESTEROLEMIA   ? ?PCP:  [name], MD ?Pharmacy:   ?[id number] [place], [place] - [address] [id number]?Phone: [phone]: [phone]? ?[facility] [place] ?[facility]?Phone: [phone]: [phone]? ?[place] ?[id number]?Phone: [phone]: [phone]? ? ? ? ?Social Determinants of Health (SDOH) Interventions ?  ? ?Readmission Risk Interventions ?Readmission Risk Prevention Plan 03/14/2022  ?Post Dischage Appt Complete  ?Medication Screening Complete  ?Transportation Screening Complete  ?Some recent data might be hidden  ? ? ? ?

## 2022-03-15 DIAGNOSIS — F0390 Unspecified dementia without behavioral disturbance: Secondary | ICD-10-CM | POA: Diagnosis not present

## 2022-03-15 DIAGNOSIS — R2681 Unsteadiness on feet: Secondary | ICD-10-CM | POA: Diagnosis not present

## 2022-03-15 DIAGNOSIS — R41841 Cognitive communication deficit: Secondary | ICD-10-CM | POA: Diagnosis not present

## 2022-03-15 DIAGNOSIS — Z8679 Personal history of other diseases of the circulatory system: Secondary | ICD-10-CM | POA: Diagnosis not present

## 2022-03-15 DIAGNOSIS — G4733 Obstructive sleep apnea (adult) (pediatric): Secondary | ICD-10-CM | POA: Diagnosis not present

## 2022-03-15 DIAGNOSIS — L03119 Cellulitis of unspecified part of limb: Secondary | ICD-10-CM | POA: Diagnosis not present

## 2022-03-15 DIAGNOSIS — D464 Refractory anemia, unspecified: Secondary | ICD-10-CM | POA: Diagnosis not present

## 2022-03-15 DIAGNOSIS — W19XXXA Unspecified fall, initial encounter: Secondary | ICD-10-CM | POA: Diagnosis not present

## 2022-03-15 DIAGNOSIS — M6281 Muscle weakness (generalized): Secondary | ICD-10-CM | POA: Diagnosis not present

## 2022-03-15 DIAGNOSIS — S72001A Fracture of unspecified part of neck of right femur, initial encounter for closed fracture: Secondary | ICD-10-CM | POA: Diagnosis not present

## 2022-03-15 DIAGNOSIS — S72091D Other fracture of head and neck of right femur, subsequent encounter for closed fracture with routine healing: Secondary | ICD-10-CM | POA: Diagnosis not present

## 2022-03-15 DIAGNOSIS — Z96641 Presence of right artificial hip joint: Secondary | ICD-10-CM | POA: Diagnosis not present

## 2022-03-15 DIAGNOSIS — S72031D Displaced midcervical fracture of right femur, subsequent encounter for closed fracture with routine healing: Secondary | ICD-10-CM | POA: Diagnosis not present

## 2022-03-15 DIAGNOSIS — F39 Unspecified mood [affective] disorder: Secondary | ICD-10-CM | POA: Diagnosis not present

## 2022-03-15 DIAGNOSIS — R4189 Other symptoms and signs involving cognitive functions and awareness: Secondary | ICD-10-CM | POA: Diagnosis not present

## 2022-03-15 DIAGNOSIS — M1990 Unspecified osteoarthritis, unspecified site: Secondary | ICD-10-CM | POA: Diagnosis not present

## 2022-03-15 DIAGNOSIS — Z7401 Bed confinement status: Secondary | ICD-10-CM | POA: Diagnosis not present

## 2022-03-15 DIAGNOSIS — Z9181 History of falling: Secondary | ICD-10-CM | POA: Diagnosis not present

## 2022-03-15 DIAGNOSIS — D696 Thrombocytopenia, unspecified: Secondary | ICD-10-CM | POA: Diagnosis not present

## 2022-03-15 DIAGNOSIS — R609 Edema, unspecified: Secondary | ICD-10-CM | POA: Diagnosis not present

## 2022-03-15 DIAGNOSIS — R2689 Other abnormalities of gait and mobility: Secondary | ICD-10-CM | POA: Diagnosis not present

## 2022-03-15 DIAGNOSIS — M159 Polyosteoarthritis, unspecified: Secondary | ICD-10-CM | POA: Diagnosis not present

## 2022-03-15 DIAGNOSIS — R531 Weakness: Secondary | ICD-10-CM | POA: Diagnosis not present

## 2022-03-15 DIAGNOSIS — D62 Acute posthemorrhagic anemia: Secondary | ICD-10-CM | POA: Diagnosis not present

## 2022-03-15 DIAGNOSIS — E559 Vitamin D deficiency, unspecified: Secondary | ICD-10-CM | POA: Diagnosis not present

## 2022-03-15 DIAGNOSIS — I4891 Unspecified atrial fibrillation: Secondary | ICD-10-CM | POA: Diagnosis not present

## 2022-03-15 DIAGNOSIS — F32A Depression, unspecified: Secondary | ICD-10-CM | POA: Diagnosis not present

## 2022-03-15 DIAGNOSIS — W1789XD Other fall from one level to another, subsequent encounter: Secondary | ICD-10-CM | POA: Diagnosis not present

## 2022-03-15 DIAGNOSIS — I10 Essential (primary) hypertension: Secondary | ICD-10-CM | POA: Diagnosis not present

## 2022-03-15 LAB — GLUCOSE, CAPILLARY
Glucose-Capillary: 102 mg/dL — ABNORMAL HIGH (ref 70–99)
Glucose-Capillary: 140 mg/dL — ABNORMAL HIGH (ref 70–99)
Glucose-Capillary: 91 mg/dL (ref 70–99)
Glucose-Capillary: 93 mg/dL (ref 70–99)
Glucose-Capillary: 95 mg/dL (ref 70–99)

## 2022-03-15 LAB — CBC
HCT: 28.3 % — ABNORMAL LOW (ref 39.0–52.0)
Hemoglobin: 9.2 g/dL — ABNORMAL LOW (ref 13.0–17.0)
MCH: 32.6 pg (ref 26.0–34.0)
MCHC: 32.5 g/dL (ref 30.0–36.0)
MCV: 100.4 fL — ABNORMAL HIGH (ref 80.0–100.0)
Platelets: 155 10*3/uL (ref 150–400)
RBC: 2.82 MIL/uL — ABNORMAL LOW (ref 4.22–5.81)
RDW: 13 % (ref 11.5–15.5)
WBC: 9.4 10*3/uL (ref 4.0–10.5)
nRBC: 0.2 % (ref 0.0–0.2)

## 2022-03-15 MED ORDER — POLYETHYLENE GLYCOL 3350 17 GM/SCOOP PO POWD: 17.0000 g | Freq: Two times a day (BID) | ORAL | 0 refills | Status: DC | PRN

## 2022-03-15 MED ORDER — ACETAMINOPHEN 500 MG PO TABS: ORAL_TABLET | ORAL | 0 refills | Status: AC

## 2022-03-15 MED ORDER — FLEET ENEMA 7-19 GM/118ML RE ENEM: 1.0000 | ENEMA | Freq: Every day | RECTAL | 0 refills | Status: DC | PRN

## 2022-03-15 MED ORDER — OXYCODONE HCL 5 MG PO TABS: 5.0000 mg | ORAL_TABLET | Freq: Three times a day (TID) | ORAL | 0 refills | Status: AC | PRN

## 2022-03-15 MED ORDER — SODIUM CHLORIDE 0.9 % IV SOLN
250.0000 mg | Freq: Once | INTRAVENOUS | Status: AC
  Administered 2022-03-15: 250 mg via INTRAVENOUS
  Filled 2022-03-15: qty 20

## 2022-03-15 MED ORDER — ASPIRIN 81 MG PO CHEW
81.0000 mg | CHEWABLE_TABLET | Freq: Two times a day (BID) | ORAL | Status: AC
Start: 2022-03-15 — End: 2022-04-14

## 2022-03-15 MED ORDER — ENSURE SURGERY PO LIQD: 237.0000 mL | Freq: Two times a day (BID) | ORAL | Status: DC

## 2022-03-15 MED ORDER — SENNOSIDES-DOCUSATE SODIUM 8.6-50 MG PO TABS: 1.0000 | ORAL_TABLET | Freq: Two times a day (BID) | ORAL | 0 refills | Status: DC | PRN

## 2022-03-15 NOTE — Discharge Summary (Signed)
Physician Discharge Summary  Jason [name] WUJ:[id number] DOB: 1941-08-24 DOA: 03/11/2022  PCP: Kristian Covey, MD  Admit date: 03/11/2022 Discharge date: 03/15/2022 Admitted From: Home Disposition: SNF Recommendations for Outpatient Follow-up:  Follow ups as below. Please obtain CBC/BMP/Mag at follow up Please follow up on the following pending results: None   Discharge Condition: Stable CODE STATUS: Full code  Follow-up Information     Swinteck, Arlys John, MD Follow up in 2 week(s).   Specialty: Orthopedic Surgery Why: For wound re-check Contact information: [address] [place] [address] [phone]                 Hospital course 81 year old M with PMH of dementia, OSA not on CPAP, A-fib not on anticoagulation, HTN, osteoarthritis, anxiety and depression presenting with right hip pain after accidental fall when he lost his footing while attempting to carry heavy [place] up the steps to his home in the rain.  No head trauma or LOC.  Admitted for right femoral neck fracture.  Underwent ORIF on 03/12/2022.  Hgb dropped from 13.4 to 8.7 but improved to 9.2 after stopping intravenous fluid.  He had about 500 cc surgical blood loss documented.  No further bleeding.  Therapy recommended SNF.  He will be WBAT per orthopedic surgery recommendation.  He will be on aspirin 81 mg twice daily for VTE prophylaxis.  Orthopedic surgery recommended outpatient follow-up in 2 weeks for wound check.    See individual problem list below for more on hospital course.  Problems addressed during this hospitalization Problem  Hip Fracture (Hcc)  Acute Blood Loss As Cause of Postoperative Anemia  Accidental Fall  Cognitive Impairment  Mood disorder (HCC)  Osa (Obstructive Sleep Apnea)  Hypertension  Osteoarthritis  History of Atrial Fibrillation  Thrombocytopenia (Hcc) (Resolved)    Assessment and Plan: * Hip fracture (HCC) Traumatic from accidental fall.  S/p  ORIF by Dr. Linna Caprice on 03/12/2022.  Vitamin D 54.  Some erythema around surgical site.  Isolated fever to 101.4 on postop day 1.  This was in the setting of increased room temperature.  He has not had further fever. -WBAT, aspirin 81 mg twice daily for DVT prophylaxis and pain control per Ortho -Scheduled Tylenol 1 g 3 times daily for 5 days followed by 1 g 3 times daily as needed -Oxycodone 5 mg every 8 hours as needed severe pain -Bowel regimen -Continue therapy at SNF. -Outpatient follow-up with orthopedic surgery  Acute blood loss as cause of postoperative anemia Recent Labs    03/11/22 2100 03/12/22 0526 03/13/22 0544 03/14/22 0528 03/15/22 0454  HGB 13.4 12.0* 8.9* 8.7* 9.2*  EBL of 500 cc documented.  H&H seems to have leveled off 9.2.  Anemia panel with iron deficiency.  Patient and family denies melena or hematochezia. -IV ferric gluconate 250 mg on 3/20 and 3/21 -Recheck CBC in 1 week   Accidental fall -See hip fracture -Fall precaution  Cognitive impairment Continue home Aricept.   -He has mild first-degree AV block but not bradycardic. -Reorientation and delirium precautions  Mood disorder (HCC) Stable -Continue home lithium and Zoloft.  OSA (obstructive sleep apnea) Not on CPAP or oxygen.  Hypertension Normotensive for most part. -Continue home meds  Osteoarthritis Pain control as above  History of atrial fibrillation Currently in normal sinus rhythm except for borderline first-degree AVB likely from Aricept.  Not on meds for A-fib.   Increased nutrient needs Nutrition Problem: Increased nutrient needs Etiology: post-op healing, hip fracture  Signs/Symptoms: estimated needs Interventions: Ensure Surgery, MVI     Vital signs Vitals:   03/14/22 0418 03/14/22 1511 03/14/22 1958 03/15/22 0516  BP: 120/72 106/63 112/64 111/60  Pulse: 73 68 80 76  Temp: 98.9 F (37.2 C) 98.4 F (36.9 C) 99.5 F (37.5 C) 98.9 F (37.2 C)  Resp: 18 17 18 18    Height:      Weight:      SpO2: 95% 95% 97% 95%  TempSrc: Oral Oral Oral Oral  BMI (Calculated):         Discharge exam  GENERAL: No apparent distress.  Nontoxic. HEENT: MMM.  Vision and hearing grossly intact.  NECK: Supple.  No apparent JVD.  RESP:  No IWOB.  Fair aeration bilaterally. CVS:  RRR. Heart sounds normal.  ABD/GI/GU: BS+. Abd soft, NTND.  MSK/EXT:  Moves extremities. No apparent deformity. No edema.  SKIN: Dressing over surgical site DCI.  Slight bruise posteriorly.  NEURO: Awake and alert. Oriented appropriately.  No apparent focal neuro deficit. PSYCH: Calm. Normal affect.   Discharge Instructions  Allergies as of 03/15/2022   No Known Allergies      Medication List     STOP taking these medications    triamcinolone cream 0.1 % Commonly known as: KENALOG       TAKE these medications    acetaminophen 500 MG tablet Commonly known as: TYLENOL Take 2 tablets (1,000 mg total) by mouth every 8 (eight) hours for 5 days, THEN 2 tablets (1,000 mg total) every 8 (eight) hours as needed for up to 25 days. Start taking on: March 15, 2022   amLODipine 5 MG tablet Commonly known as: NORVASC TAKE 1 TABLET BY MOUTH EVERY DAY   aspirin 81 MG chewable tablet Chew 1 tablet (81 mg total) by mouth 2 (two) times daily.   donepezil 10 MG tablet Commonly known as: ARICEPT Take 1 tablet (10 mg total) by mouth at bedtime.   feeding supplement Liqd Take 237 mLs by mouth 2 (two) times daily between meals.   lithium carbonate 150 MG capsule Take 3 capsules (450 mg total) by mouth daily.   Multivitamin Adults Tabs Take 1 tablet by mouth daily.   oxyCODONE 5 MG immediate release tablet Commonly known as: Roxicodone Take 1 tablet (5 mg total) by mouth every 8 (eight) hours as needed for up to 5 days for severe pain.   polyethylene glycol powder 17 GM/SCOOP powder Commonly known as: MiraLax Take 17 g by mouth 2 (two) times daily as needed for moderate  constipation.   senna-docusate 8.6-50 MG tablet Commonly known as: Senokot-S Take 1 tablet by mouth 2 (two) times daily between meals as needed for mild constipation. What changed:  how much to take when to take this   sertraline 100 MG tablet Commonly known as: ZOLOFT Take 2 tablets (200 mg total) by mouth daily. What changed: how much to take   simvastatin 20 MG tablet Commonly known as: ZOCOR Take 1 tablet (20 mg total) by mouth daily.   sodium phosphate 7-19 GM/118ML Enem Place 133 mLs (1 enema total) rectally daily as needed for severe constipation.   Vitamin D (Ergocalciferol) 1.25 MG (50000 UNIT) Caps capsule Commonly known as: DRISDOL TAKE 1 CAPSULE EVERY 7 DAYS        Consultations: Orthopedic surgery  Procedures/Studies: ORIF of right hip fracture   DG Pelvis Portable  Result Date: 03/12/2022 CLINICAL DATA:  Right hip replacement. EXAM: PORTABLE PELVIS 1-2 VIEWS COMPARISON:  CT right  hip from yesterday. FINDINGS: The right hip demonstrates a total arthroplasty without evidence of hardware failure or complication. There is no fracture or dislocation. The alignment is anatomic. Post-surgical changes noted in the surrounding soft tissues. IMPRESSION: 1. Interval right total hip arthroplasty without evidence of acute postoperative complication. Electronically Signed   By: Obie Dredge M.D.   On: 03/12/2022 11:38   CT HIP RIGHT WO CONTRAST  Result Date: 03/11/2022 CLINICAL DATA:  Right hip fracture. EXAM: CT OF THE RIGHT HIP WITHOUT CONTRAST TECHNIQUE: Multidetector CT imaging of the right hip was performed according to the standard protocol. Multiplanar CT image reconstructions were also generated. RADIATION DOSE REDUCTION: This exam was performed according to the departmental dose-optimization program which includes automated exposure control, adjustment of the mA and/or kV according to patient size and/or use of iterative reconstruction technique. COMPARISON:   Right hip x-rays from same day. FINDINGS: Bones/Joint/Cartilage Acute minimally impacted right subcapital femoral neck fracture again noted. No additional fracture. No dislocation. Mild-to-moderate right hip joint space narrowing with marginal osteophytes and subchondral cysts. Faint chondrocalcinosis. Degenerative changes of the pubic symphysis. Osteopenia. Small right hip joint effusion. Ligaments Ligaments are suboptimally evaluated by CT. Muscles and Tendons Grossly intact. Soft tissue No fluid collection or hematoma.  No soft tissue mass. IMPRESSION: 1. Acute minimally impacted right subcapital femoral neck fracture. 2. Mild-to-moderate right hip osteoarthritis. Electronically Signed   By: Obie Dredge M.D.   On: 03/11/2022 21:19   Chest Portable 1 View  Result Date: 03/12/2022 CLINICAL DATA:  Fall, chest pain EXAM: PORTABLE CHEST 1 VIEW COMPARISON:  None. FINDINGS: The heart size and mediastinal contours are within normal limits. Both lungs are clear. The visualized skeletal structures are unremarkable. IMPRESSION: No active disease. Electronically Signed   By: Helyn Numbers M.D.   On: 03/12/2022 02:39   DG Knee Right Port  Result Date: 03/11/2022 CLINICAL DATA:  Right hip fracture.  Evaluate the right knee. EXAM: PORTABLE RIGHT KNEE - 1-2 VIEW COMPARISON:  None. FINDINGS: No acute fracture or dislocation. No joint effusion. Small patellar marginal osteophytes. Joint spaces are relatively preserved. Chondrocalcinosis of the menisci. Bone mineralization is normal. Soft tissues are unremarkable. IMPRESSION: 1. No acute osseous abnormality. Electronically Signed   By: Obie Dredge M.D.   On: 03/11/2022 21:13   DG C-Arm 1-60 Min-No Report  Result Date: 03/12/2022 Fluoroscopy was utilized by the requesting physician.  No radiographic interpretation.   DG C-Arm 1-60 Min-No Report  Result Date: 03/12/2022 Fluoroscopy was utilized by the requesting physician.  No radiographic interpretation.    DG C-Arm 1-60 Min-No Report  Result Date: 03/12/2022 Fluoroscopy was utilized by the requesting physician.  No radiographic interpretation.   DG HIP UNILAT WITH PELVIS 1V RIGHT  Result Date: 03/12/2022 CLINICAL DATA:  Intraoperative right hip replacement. EXAM: DG HIP (WITH OR WITHOUT PELVIS) 1V RIGHT COMPARISON:  Right hip radiographs 03/11/2022 FINDINGS: Images were performed intraoperatively without the presence of a radiologist. The patient appears to be undergoing total right hip arthroplasty for the previously seen right femoral neck fracture. Total fluoroscopy images: 3 Total fluoroscopy time: 13 seconds Total dose: 1.99 mGy mGy Please see intraoperative findings for further detail. IMPRESSION: Fluoroscopy for total right hip arthroplasty. Electronically Signed   By: Neita Garnet M.D.   On: 03/12/2022 10:54   DG Hip Unilat W or Wo Pelvis 2-3 Views Right  Result Date: 03/11/2022 CLINICAL DATA:  Right hip pain after fall. EXAM: DG HIP (WITH OR WITHOUT PELVIS) 2-3V RIGHT COMPARISON:  None. FINDINGS: Acute minimally impacted right subcapital femoral neck fracture. No additional fracture. No dislocation. IMPRESSION: 1. Acute minimally impacted right subcapital femoral neck fracture. Electronically Signed   By: Obie Dredge M.D.   On: 03/11/2022 19:35       The results of significant diagnostics from this hospitalization (including imaging, microbiology, ancillary and laboratory) are listed below for reference.     Microbiology: Recent Results (from the past 240 hour(s))  Surgical PCR screen     Status: None   Collection Time: 03/12/22  4:08 AM   Specimen: Nasal Mucosa; Nasal Swab  Result Value Ref Range Status   MRSA, PCR NEGATIVE NEGATIVE Final   Staphylococcus aureus NEGATIVE NEGATIVE Final    Comment: (NOTE) The Xpert SA Assay (FDA approved for NASAL specimens in patients 95 years of age and older), is one component of a comprehensive surveillance program. It is not intended  to diagnose infection nor to guide or monitor treatment. Performed at [facility], [address], [place], [place] [address]      Labs:  CBC: Recent Labs  Lab 03/11/22 2100 03/12/22 0526 03/13/22 0544 03/14/22 0528 03/15/22 0454  WBC 10.6* 8.9 6.6 7.9 9.4  NEUTROABS 9.2*  --   --   --   --   HGB 13.4 12.0* 8.9* 8.7* 9.2*  HCT 39.9 36.1* 27.0* 27.1* 28.3*  MCV 97.6 97.6 100.4* 100.7* 100.4*  PLT 147* 142* 121* 126* 155   BMP &GFR Recent Labs  Lab 03/11/22 2100 03/12/22 0526 03/13/22 0544 03/14/22 0528  NA 136 137 135 136  K 4.5 4.0 3.8 3.9  CL 106 105 106 107  CO2 21* 24 22 24   GLUCOSE 123* 122* 128* 124*  BUN 19 19 23 21   CREATININE 0.93 0.99 1.05 0.86  CALCIUM 9.4 9.4 8.3* 8.8*  MG  --   --  1.8 2.1  PHOS  --   --  2.9 2.2*   Estimated Creatinine Clearance: 80 mL/min (by C-G formula based on SCr of 0.86 mg/dL). Liver & Pancreas: Recent Labs  Lab 03/13/22 0544 03/14/22 0528  ALBUMIN 3.1* 3.0*   No results for input(s): LIPASE, AMYLASE in the last 168 hours. No results for input(s): AMMONIA in the last 168 hours. Diabetic: No results for input(s): HGBA1C in the last 72 hours. Recent Labs  Lab 03/14/22 1712 03/14/22 2001 03/15/22 0055 03/15/22 0517 03/15/22 0745  GLUCAP 116* 125* 91 95 93   Cardiac Enzymes: Recent Labs  Lab 03/13/22 0544  CKTOTAL 447*   No results for input(s): PROBNP in the last 8760 hours. Coagulation Profile: No results for input(s): INR, PROTIME in the last 168 hours. Thyroid Function Tests: No results for input(s): TSH, T4TOTAL, FREET4, T3FREE, THYROIDAB in the last 72 hours. Lipid Profile: No results for input(s): CHOL, HDL, LDLCALC, TRIG, CHOLHDL, LDLDIRECT in the last 72 hours. Anemia Panel: Recent Labs    03/14/22 0528 03/14/22 0529  VITAMINB12 1,126*  --   FOLATE 36.2  --   FERRITIN 192  --   TIBC 250  --   IRON 18*  --   RETICCTPCT  --  1.8   Urine analysis:    Component Value  Date/Time   COLORURINE YELLOW 03/12/2022 0514   APPEARANCEUR CLEAR 03/12/2022 0514   LABSPEC 1.019 03/12/2022 0514   PHURINE 5.0 03/12/2022 0514   GLUCOSEU NEGATIVE 03/12/2022 0514   GLUCOSEU NEGATIVE 03/04/2011 0831   HGBUR MODERATE (A) 03/12/2022 0514   BILIRUBINUR NEGATIVE 03/12/2022 0514  KETONESUR 5 (A) 03/12/2022 0514   PROTEINUR NEGATIVE 03/12/2022 0514   UROBILINOGEN 0.2 03/04/2011 0831   NITRITE NEGATIVE 03/12/2022 0514   LEUKOCYTESUR NEGATIVE 03/12/2022 0514   Sepsis Labs: Invalid input(s): PROCALCITONIN, LACTICIDVEN   Time coordinating discharge: 45 minutes  SIGNED:  Almon Hercules, MD  [facility] 03/15/2022, 10:35 AM

## 2022-03-15 NOTE — TOC Transition Note (Signed)
Transition of Care (TOC) - CM/SW Discharge Note ? ? ?Patient Details  ?Name: Jason [name] ?MRN: [id number] ?Date of Birth: 12/13/1941 ? ?Transition of Care (TOC) CM/SW Contact:  ?Kedron Uno, Marjie Skiff, RN ?Phone Number: ?03/15/2022, 11:15 AM ? ? ?Clinical Narrative:    ?Pt to dc to [facility] today. PTAR scheduled for 1:30pm. Rn to call report to [phone]. ? ? ?Final next level of care: [place] ?Barriers to Discharge: Continued Medical Work up ? ? ?Patient Goals and CMS Choice ?Patient states their goals for this hospitalization and ongoing recovery are:: To go home ?  ?  ?Discharge Plan and Services ?  ?Discharge Planning Services: CM Consult ?           ?  ?  ?  ?  ? ?Readmission Risk Interventions ?Readmission Risk Prevention Plan 03/14/2022  ?Post Dischage Appt Complete  ?Medication Screening Complete  ?Transportation Screening Complete  ?Some recent data might be hidden  ? ? ? ? ? ?

## 2022-03-15 NOTE — Progress Notes (Signed)
Occupational Therapy Treatment ?Patient Details ?Name: Jason [name] ?MRN: [id number] ?DOB: 16-Aug-1941 ?Today's Date: 03/15/2022 ? ? ?History of present illness patient is a 81 year old male who presented to the hosptial with R hip pain after fall at home on steps while carrying items. patient was found to have R femoral neck fx. on 3/18 patient underwent R THA anterior. PMH: dementia, OSA not on CPAP, A-fib not on anticoagulation, HTN, osteoarthritis, anxiety and depression ?  ?OT comments ? Patient needing max A for bed mobility with difficulty mobilizing R LE towards edge of bed. Patient also needing maximal assist to upright trunk difficulty pushing up from sidelying on elbow. With bed height elevated and mod cues for hand and left leg positioning patient mod A to power up to standing. Patient then able to take few steps to recliner with min A and minimal assistance with eccentric control into chair. Patient set up with breakfast. Progressing towards acute OT goals, will continue to follow.  ? ?Recommendations for follow up therapy are one component of a multi-disciplinary discharge planning process, led by the attending physician.  Recommendations may be updated based on patient status, additional functional criteria and insurance authorization. ?   ?Follow Up Recommendations ? Skilled nursing-short term rehab (<3 hours/day)  ?  ?Assistance Recommended at Discharge Frequent or constant Supervision/Assistance  ?Patient can return home with the following ? Two people to help with walking and/or transfers;Two people to help with bathing/dressing/bathroom;Direct supervision/assist for medications management;Help with stairs or ramp for entrance;Assist for transportation;Direct supervision/assist for financial management;Assistance with cooking/housework ?  ?Equipment Recommendations ? Other (comment) (RW)  ?  ?   ?Precautions / Restrictions Precautions ?Precautions: Fall ?Precaution Comments: monitor  O2 ?Restrictions ?Weight Bearing Restrictions: No ?RLE Weight Bearing: Weight bearing as tolerated  ? ? ?  ? ?Mobility Bed Mobility ?Overal bed mobility: Needs Assistance ?Bed Mobility: Sidelying to Sit ?  ?Sidelying to sit: Max assist, HOB elevated ?  ?  ?  ?General bed mobility comments: Cues to bridge hips towards edge of bed. Difficulty mobilizing R LE towards edge of bed needing max A to bring legs off bed and assist to upright trunk from sidelying position. ?  ? ? ?  ?Balance Overall balance assessment: Needs assistance ?Sitting-balance support: Feet supported ?Sitting balance-Leahy Scale: Fair ?  ?  ?Standing balance support: Reliant on assistive device for balance, During functional activity, Bilateral upper extremity supported ?Standing balance-Leahy Scale: Poor ?  ?  ?  ?  ?  ?  ?  ?  ?  ?  ?  ?  ?   ? ?ADL either performed or assessed with clinical judgement  ? ?ADL Overall ADL's : Needs assistance/impaired ?  ?  ?  ?  ?  ?  ?  ?  ?  ?  ?  ?  ?Toilet Transfer: Moderate assistance;Cueing for safety;Cueing for sequencing;Stand-pivot;Rolling walker (2 wheels) ?Toilet Transfer Details (indicate cue type and reason): To recliner chair. Patient needs bed height elevated and mod A to power up to standing. Verbal cues for safe hand placement. ?  ?  ?  ?  ?Functional mobility during ADLs: Moderate assistance;Cueing for safety;Cueing for sequencing;Rolling walker (2 wheels) ?  ?  ? ? ? ?Cognition Arousal/Alertness: Awake/alert ?Behavior During Therapy: [facility] for tasks assessed/performed ?Overall Cognitive Status: Within Functional Limits for tasks assessed ?  ?  ?  ?  ?  ?  ?  ?  ?  ?  ?  ?  ?  ?  ?  ?  ?  General Comments: Hx of dementia however patient appropriate, following directions. ?  ?  ?   ?   ?   ?   ? ? ?Pertinent Vitals/ Pain       Pain Assessment ?Pain Assessment: Faces ?Faces Pain Scale: Hurts little more ?Pain Location: R hip ?Pain Descriptors / Indicators: Discomfort, Guarding ?Pain Intervention(s):  Monitored during session ? ?   ?   ? ?Frequency ? Min 2X/week  ? ? ? ? ?  ?Progress Toward Goals ? ?OT Goals(current goals can now be found in the care plan section) ? Progress towards OT goals: Progressing toward goals ? ?Acute Rehab OT Goals ?Patient Stated Goal: eat breakfast ?OT Goal Formulation: With patient/family ?Time For Goal Achievement: 03/27/22 ?Potential to Achieve Goals: Good ?ADL Goals ?Pt Will Perform Grooming: with modified independence;standing ?Pt Will Perform Lower Body Dressing: with modified independence;sit to/from stand;sitting/lateral leans;with adaptive equipment ?Pt Will Transfer to Toilet: with modified independence;regular height toilet;ambulating ?Pt Will Perform Toileting - Clothing Manipulation and hygiene: with modified independence;sitting/lateral leans;sit to/from stand  ?Plan Discharge plan remains appropriate   ? ?   ?AM-PAC OT "6 Clicks" Daily Activity     ?Outcome Measure ? ? Help from another person eating meals?: A Little ?Help from another person taking care of personal grooming?: A Little ?Help from another person toileting, which includes using toliet, bedpan, or urinal?: A Lot ?Help from another person bathing (including washing, rinsing, drying)?: A Lot ?Help from another person to put on and taking off regular upper body clothing?: A Little ?Help from another person to put on and taking off regular lower body clothing?: A Lot ?6 Click Score: 15 ? ?  ?End of Session Equipment Utilized During Treatment: Gait belt;Rolling walker (2 wheels) ? ?OT Visit Diagnosis: Unsteadiness on feet (R26.81);Muscle weakness (generalized) (M62.81) ?  ?Activity Tolerance Patient tolerated treatment well ?  ?Patient Left in chair;with call bell/phone within reach;with chair alarm set;with family/visitor present ?  ?Nurse Communication Mobility status ?  ? ?   ? ?Time: 4967-5916 ?OT Time Calculation (min): 14 min ? ?Charges: OT General Charges ?$OT Visit: 1 Visit ?OT Treatments ?$Self  Care/Home Management : 8-22 mins ? ?Delbert Phenix OT ?OT pager: [phone] ? ? ?Rosemary Holms ?03/15/2022, 12:18 PM ?

## 2022-03-15 NOTE — Care Management Important Message (Signed)
Important Message ? ?Patient Details IM Letter placed in Patients room. ?Name: Jason [name] ?MRN: [id number] ?Date of Birth: 1941/01/27 ? ? ?Medicare Important Message Given:  Yes ? ? ? ? ?Kerin Salen ?03/15/2022, 9:15 AM ?

## 2022-03-16 ENCOUNTER — Other Ambulatory Visit: Payer: Self-pay | Admitting: *Deleted

## 2022-03-16 DIAGNOSIS — M159 Polyosteoarthritis, unspecified: Secondary | ICD-10-CM | POA: Diagnosis not present

## 2022-03-16 DIAGNOSIS — I10 Essential (primary) hypertension: Secondary | ICD-10-CM | POA: Diagnosis not present

## 2022-03-16 DIAGNOSIS — F32A Depression, unspecified: Secondary | ICD-10-CM | POA: Diagnosis not present

## 2022-03-16 NOTE — Patient Outreach (Signed)
Per [place] eligible member currently resides in [facility].  Screened for potential [facility] care coordination services as a benefit of [employer] plan. ? ?Jason [name] admitted to SNF on 03/15/22 after hospitalization. ? ?Facility site visit to [facility] skilled nursing facility. Met with Jason [name], SNF SW to make aware writer is following for transition plans and potential THN needs.  ? ?Member's PCP at [facility] has [place] care coordination/care management team available if needed. CCM services would need to be ordered by PCP. ? ?Spoke with Jason [name] and spouse in room at [facility].  Jason [name] confirms transiton plan is to return home. Discussed PCP office has care management/care coordination team available if needed post SNF. Jason [name] states she has received [facility] information in the mail. Discussed writer will continue to follow along for any potential care coordination/care management needs upon SNF discharge.   ? ?Confirmed PCP is Jason [name] with [facility]. ? ?Provided [facility] Care Management brochure, 24-hr nurse advice line magnet, and writer's contact information.  ? ?Will continue to follow while Mr. Ogata resides in [place].  ? ? ?Jason Rolling, MSN, Jason [name],Jason [name] ?[place] Coordinator ?[phone] [facility]) ?[phone]  (Toll free office)  ? ? ? ? ?  ?

## 2022-03-17 DIAGNOSIS — F32A Depression, unspecified: Secondary | ICD-10-CM | POA: Diagnosis not present

## 2022-03-17 DIAGNOSIS — M159 Polyosteoarthritis, unspecified: Secondary | ICD-10-CM | POA: Diagnosis not present

## 2022-03-17 DIAGNOSIS — I10 Essential (primary) hypertension: Secondary | ICD-10-CM | POA: Diagnosis not present

## 2022-03-17 DIAGNOSIS — Z96641 Presence of right artificial hip joint: Secondary | ICD-10-CM | POA: Diagnosis not present

## 2022-03-21 DIAGNOSIS — L03119 Cellulitis of unspecified part of limb: Secondary | ICD-10-CM | POA: Diagnosis not present

## 2022-03-21 DIAGNOSIS — M6281 Muscle weakness (generalized): Secondary | ICD-10-CM | POA: Diagnosis not present

## 2022-03-21 DIAGNOSIS — D464 Refractory anemia, unspecified: Secondary | ICD-10-CM | POA: Diagnosis not present

## 2022-03-21 DIAGNOSIS — R609 Edema, unspecified: Secondary | ICD-10-CM | POA: Diagnosis not present

## 2022-03-25 DIAGNOSIS — L03119 Cellulitis of unspecified part of limb: Secondary | ICD-10-CM | POA: Diagnosis not present

## 2022-03-25 DIAGNOSIS — M6281 Muscle weakness (generalized): Secondary | ICD-10-CM | POA: Diagnosis not present

## 2022-03-28 DIAGNOSIS — R609 Edema, unspecified: Secondary | ICD-10-CM | POA: Diagnosis not present

## 2022-03-28 DIAGNOSIS — L03119 Cellulitis of unspecified part of limb: Secondary | ICD-10-CM | POA: Diagnosis not present

## 2022-03-28 DIAGNOSIS — S72031D Displaced midcervical fracture of right femur, subsequent encounter for closed fracture with routine healing: Secondary | ICD-10-CM | POA: Diagnosis not present

## 2022-03-28 DIAGNOSIS — S72091D Other fracture of head and neck of right femur, subsequent encounter for closed fracture with routine healing: Secondary | ICD-10-CM | POA: Diagnosis not present

## 2022-03-29 DIAGNOSIS — W1789XD Other fall from one level to another, subsequent encounter: Secondary | ICD-10-CM | POA: Diagnosis not present

## 2022-03-29 DIAGNOSIS — M159 Polyosteoarthritis, unspecified: Secondary | ICD-10-CM | POA: Diagnosis not present

## 2022-03-29 DIAGNOSIS — F32A Depression, unspecified: Secondary | ICD-10-CM | POA: Diagnosis not present

## 2022-03-29 DIAGNOSIS — Z96641 Presence of right artificial hip joint: Secondary | ICD-10-CM | POA: Diagnosis not present

## 2022-03-30 DIAGNOSIS — F0390 Unspecified dementia without behavioral disturbance: Secondary | ICD-10-CM | POA: Diagnosis not present

## 2022-03-30 DIAGNOSIS — M6281 Muscle weakness (generalized): Secondary | ICD-10-CM | POA: Diagnosis not present

## 2022-03-30 DIAGNOSIS — W19XXXA Unspecified fall, initial encounter: Secondary | ICD-10-CM | POA: Diagnosis not present

## 2022-03-30 DIAGNOSIS — Z96641 Presence of right artificial hip joint: Secondary | ICD-10-CM | POA: Diagnosis not present

## 2022-03-30 DIAGNOSIS — S72091D Other fracture of head and neck of right femur, subsequent encounter for closed fracture with routine healing: Secondary | ICD-10-CM | POA: Diagnosis not present

## 2022-03-30 DIAGNOSIS — Z9181 History of falling: Secondary | ICD-10-CM | POA: Diagnosis not present

## 2022-03-30 DIAGNOSIS — R2689 Other abnormalities of gait and mobility: Secondary | ICD-10-CM | POA: Diagnosis not present

## 2022-04-01 ENCOUNTER — Encounter: Payer: Self-pay | Admitting: Family Medicine

## 2022-04-04 ENCOUNTER — Other Ambulatory Visit: Payer: Self-pay | Admitting: Family Medicine

## 2022-04-04 DIAGNOSIS — Z96641 Presence of right artificial hip joint: Secondary | ICD-10-CM | POA: Diagnosis not present

## 2022-04-04 DIAGNOSIS — M6281 Muscle weakness (generalized): Secondary | ICD-10-CM | POA: Diagnosis not present

## 2022-04-04 DIAGNOSIS — S72091D Other fracture of head and neck of right femur, subsequent encounter for closed fracture with routine healing: Secondary | ICD-10-CM | POA: Diagnosis not present

## 2022-04-04 DIAGNOSIS — D464 Refractory anemia, unspecified: Secondary | ICD-10-CM | POA: Diagnosis not present

## 2022-04-05 ENCOUNTER — Other Ambulatory Visit: Payer: Self-pay | Admitting: *Deleted

## 2022-04-05 DIAGNOSIS — I4891 Unspecified atrial fibrillation: Secondary | ICD-10-CM | POA: Diagnosis not present

## 2022-04-05 DIAGNOSIS — I10 Essential (primary) hypertension: Secondary | ICD-10-CM | POA: Diagnosis not present

## 2022-04-05 DIAGNOSIS — Z9181 History of falling: Secondary | ICD-10-CM | POA: Diagnosis not present

## 2022-04-05 DIAGNOSIS — M1990 Unspecified osteoarthritis, unspecified site: Secondary | ICD-10-CM | POA: Diagnosis not present

## 2022-04-05 DIAGNOSIS — R350 Frequency of micturition: Secondary | ICD-10-CM | POA: Diagnosis not present

## 2022-04-05 DIAGNOSIS — F0394 Unspecified dementia, unspecified severity, with anxiety: Secondary | ICD-10-CM | POA: Diagnosis not present

## 2022-04-05 DIAGNOSIS — S72001D Fracture of unspecified part of neck of right femur, subsequent encounter for closed fracture with routine healing: Secondary | ICD-10-CM | POA: Diagnosis not present

## 2022-04-05 DIAGNOSIS — M109 Gout, unspecified: Secondary | ICD-10-CM | POA: Diagnosis not present

## 2022-04-05 DIAGNOSIS — G4733 Obstructive sleep apnea (adult) (pediatric): Secondary | ICD-10-CM | POA: Diagnosis not present

## 2022-04-05 DIAGNOSIS — D464 Refractory anemia, unspecified: Secondary | ICD-10-CM | POA: Diagnosis not present

## 2022-04-05 DIAGNOSIS — Z96641 Presence of right artificial hip joint: Secondary | ICD-10-CM | POA: Diagnosis not present

## 2022-04-05 DIAGNOSIS — R41841 Cognitive communication deficit: Secondary | ICD-10-CM | POA: Diagnosis not present

## 2022-04-05 DIAGNOSIS — Z7982 Long term (current) use of aspirin: Secondary | ICD-10-CM | POA: Diagnosis not present

## 2022-04-05 DIAGNOSIS — F0393 Unspecified dementia, unspecified severity, with mood disturbance: Secondary | ICD-10-CM | POA: Diagnosis not present

## 2022-04-05 DIAGNOSIS — W19XXXD Unspecified fall, subsequent encounter: Secondary | ICD-10-CM | POA: Diagnosis not present

## 2022-04-05 DIAGNOSIS — R32 Unspecified urinary incontinence: Secondary | ICD-10-CM | POA: Diagnosis not present

## 2022-04-05 DIAGNOSIS — F32A Depression, unspecified: Secondary | ICD-10-CM | POA: Diagnosis not present

## 2022-04-05 DIAGNOSIS — S72091D Other fracture of head and neck of right femur, subsequent encounter for closed fracture with routine healing: Secondary | ICD-10-CM | POA: Diagnosis not present

## 2022-04-05 NOTE — Patient Outreach (Signed)
[place] Coordinator follow up. THN eligible member screened for potential [facility] care coordination/care management services as a benefit of member's insurance plan. ? ?Member's PCP at [facility] has [place] care coordination services available. CCM services are ordered by PCP as approriate. ? ?Verified in [facility] Mr. Stcyr transitioned to home from [facility] on 04/04/22. Confirmed with SNF SW [facility] will provide PT/OT home health services.  ? ?Telephone call made to Dearis Danis (spouse/DPR) [phone] to discuss [facility] follow up. Patient identifiers confirmed. Mrs. Belarus states home health will be out today. States they are not interested in CCM services at this time.  ? ?No further THN needs assessed.  ? ? ?Marthenia Rolling, MSN, RN,BSN ?[place] Coordinator ?[phone] [facility]) ?[phone]  (Toll free office)   ?

## 2022-04-06 DIAGNOSIS — S72091D Other fracture of head and neck of right femur, subsequent encounter for closed fracture with routine healing: Secondary | ICD-10-CM | POA: Diagnosis not present

## 2022-04-07 DIAGNOSIS — W19XXXD Unspecified fall, subsequent encounter: Secondary | ICD-10-CM | POA: Diagnosis not present

## 2022-04-07 DIAGNOSIS — S72001D Fracture of unspecified part of neck of right femur, subsequent encounter for closed fracture with routine healing: Secondary | ICD-10-CM | POA: Diagnosis not present

## 2022-04-07 DIAGNOSIS — M109 Gout, unspecified: Secondary | ICD-10-CM | POA: Diagnosis not present

## 2022-04-07 DIAGNOSIS — M1990 Unspecified osteoarthritis, unspecified site: Secondary | ICD-10-CM | POA: Diagnosis not present

## 2022-04-07 DIAGNOSIS — I4891 Unspecified atrial fibrillation: Secondary | ICD-10-CM | POA: Diagnosis not present

## 2022-04-07 DIAGNOSIS — I10 Essential (primary) hypertension: Secondary | ICD-10-CM | POA: Diagnosis not present

## 2022-04-08 ENCOUNTER — Telehealth: Payer: Self-pay | Admitting: Family Medicine

## 2022-04-08 DIAGNOSIS — I10 Essential (primary) hypertension: Secondary | ICD-10-CM | POA: Diagnosis not present

## 2022-04-08 DIAGNOSIS — I4891 Unspecified atrial fibrillation: Secondary | ICD-10-CM | POA: Diagnosis not present

## 2022-04-08 DIAGNOSIS — M1990 Unspecified osteoarthritis, unspecified site: Secondary | ICD-10-CM | POA: Diagnosis not present

## 2022-04-08 DIAGNOSIS — W19XXXD Unspecified fall, subsequent encounter: Secondary | ICD-10-CM | POA: Diagnosis not present

## 2022-04-08 DIAGNOSIS — M109 Gout, unspecified: Secondary | ICD-10-CM | POA: Diagnosis not present

## 2022-04-08 DIAGNOSIS — S72001D Fracture of unspecified part of neck of right femur, subsequent encounter for closed fracture with routine healing: Secondary | ICD-10-CM | POA: Diagnosis not present

## 2022-04-08 NOTE — Telephone Encounter (Signed)
Jason [name] Electrical engineer [facility] is calling and need VO for speech therapy 1x5 ?

## 2022-04-11 DIAGNOSIS — I4891 Unspecified atrial fibrillation: Secondary | ICD-10-CM | POA: Diagnosis not present

## 2022-04-11 DIAGNOSIS — M109 Gout, unspecified: Secondary | ICD-10-CM | POA: Diagnosis not present

## 2022-04-11 DIAGNOSIS — W19XXXD Unspecified fall, subsequent encounter: Secondary | ICD-10-CM | POA: Diagnosis not present

## 2022-04-11 DIAGNOSIS — I10 Essential (primary) hypertension: Secondary | ICD-10-CM | POA: Diagnosis not present

## 2022-04-11 DIAGNOSIS — M1990 Unspecified osteoarthritis, unspecified site: Secondary | ICD-10-CM | POA: Diagnosis not present

## 2022-04-11 DIAGNOSIS — S72001D Fracture of unspecified part of neck of right femur, subsequent encounter for closed fracture with routine healing: Secondary | ICD-10-CM | POA: Diagnosis not present

## 2022-04-11 NOTE — Telephone Encounter (Signed)
Jason [name] has been given verbal orders ?

## 2022-04-13 DIAGNOSIS — S72001D Fracture of unspecified part of neck of right femur, subsequent encounter for closed fracture with routine healing: Secondary | ICD-10-CM | POA: Diagnosis not present

## 2022-04-13 DIAGNOSIS — M109 Gout, unspecified: Secondary | ICD-10-CM | POA: Diagnosis not present

## 2022-04-13 DIAGNOSIS — I10 Essential (primary) hypertension: Secondary | ICD-10-CM | POA: Diagnosis not present

## 2022-04-13 DIAGNOSIS — M1990 Unspecified osteoarthritis, unspecified site: Secondary | ICD-10-CM | POA: Diagnosis not present

## 2022-04-13 DIAGNOSIS — I4891 Unspecified atrial fibrillation: Secondary | ICD-10-CM | POA: Diagnosis not present

## 2022-04-13 DIAGNOSIS — W19XXXD Unspecified fall, subsequent encounter: Secondary | ICD-10-CM | POA: Diagnosis not present

## 2022-04-14 DIAGNOSIS — S72001D Fracture of unspecified part of neck of right femur, subsequent encounter for closed fracture with routine healing: Secondary | ICD-10-CM | POA: Diagnosis not present

## 2022-04-14 DIAGNOSIS — W19XXXD Unspecified fall, subsequent encounter: Secondary | ICD-10-CM | POA: Diagnosis not present

## 2022-04-14 DIAGNOSIS — I10 Essential (primary) hypertension: Secondary | ICD-10-CM | POA: Diagnosis not present

## 2022-04-14 DIAGNOSIS — M1990 Unspecified osteoarthritis, unspecified site: Secondary | ICD-10-CM | POA: Diagnosis not present

## 2022-04-14 DIAGNOSIS — M109 Gout, unspecified: Secondary | ICD-10-CM | POA: Diagnosis not present

## 2022-04-14 DIAGNOSIS — I4891 Unspecified atrial fibrillation: Secondary | ICD-10-CM | POA: Diagnosis not present

## 2022-04-15 DIAGNOSIS — S72001D Fracture of unspecified part of neck of right femur, subsequent encounter for closed fracture with routine healing: Secondary | ICD-10-CM | POA: Diagnosis not present

## 2022-04-15 DIAGNOSIS — M1990 Unspecified osteoarthritis, unspecified site: Secondary | ICD-10-CM | POA: Diagnosis not present

## 2022-04-15 DIAGNOSIS — I10 Essential (primary) hypertension: Secondary | ICD-10-CM | POA: Diagnosis not present

## 2022-04-15 DIAGNOSIS — W19XXXD Unspecified fall, subsequent encounter: Secondary | ICD-10-CM | POA: Diagnosis not present

## 2022-04-15 DIAGNOSIS — I4891 Unspecified atrial fibrillation: Secondary | ICD-10-CM | POA: Diagnosis not present

## 2022-04-15 DIAGNOSIS — M109 Gout, unspecified: Secondary | ICD-10-CM | POA: Diagnosis not present

## 2022-04-18 DIAGNOSIS — S72001D Fracture of unspecified part of neck of right femur, subsequent encounter for closed fracture with routine healing: Secondary | ICD-10-CM | POA: Diagnosis not present

## 2022-04-18 DIAGNOSIS — M1990 Unspecified osteoarthritis, unspecified site: Secondary | ICD-10-CM | POA: Diagnosis not present

## 2022-04-18 DIAGNOSIS — M109 Gout, unspecified: Secondary | ICD-10-CM | POA: Diagnosis not present

## 2022-04-18 DIAGNOSIS — W19XXXD Unspecified fall, subsequent encounter: Secondary | ICD-10-CM | POA: Diagnosis not present

## 2022-04-18 DIAGNOSIS — I4891 Unspecified atrial fibrillation: Secondary | ICD-10-CM | POA: Diagnosis not present

## 2022-04-18 DIAGNOSIS — I10 Essential (primary) hypertension: Secondary | ICD-10-CM | POA: Diagnosis not present

## 2022-04-19 ENCOUNTER — Ambulatory Visit (INDEPENDENT_AMBULATORY_CARE_PROVIDER_SITE_OTHER): Payer: Medicare Other | Admitting: Family Medicine

## 2022-04-19 ENCOUNTER — Encounter: Payer: Self-pay | Admitting: Family Medicine

## 2022-04-19 VITALS — BP 118/76 | HR 86 | Temp 98.2°F | Ht 73.0 in | Wt 201.5 lb

## 2022-04-19 DIAGNOSIS — D649 Anemia, unspecified: Secondary | ICD-10-CM

## 2022-04-19 DIAGNOSIS — R4189 Other symptoms and signs involving cognitive functions and awareness: Secondary | ICD-10-CM

## 2022-04-19 DIAGNOSIS — R4689 Other symptoms and signs involving appearance and behavior: Secondary | ICD-10-CM | POA: Diagnosis not present

## 2022-04-19 DIAGNOSIS — R351 Nocturia: Secondary | ICD-10-CM

## 2022-04-19 NOTE — Progress Notes (Signed)
? ?Established Patient Office Visit ? ?Subjective   ?Patient ID: Jason [name], male    DOB: 10/08/1941  Age: 81 y.o. MRN: [id number] ? ?Chief Complaint  ?Patient presents with  ? Follow-up  ? ? ?HPI ? ?Past Medical History:  ?Diagnosis Date  ? Anxiety   ? Atrial fibrillation ([place])   ? past hx  ? Colon polyp   ? Depression   ? GERD (gastroesophageal reflux disease)   ? Gout, unspecified   ? Hypertension   ? Osteoarthrosis, unspecified whether generalized or localized, unspecified site   ? Pneumonia, organism unspecified(486)   ? Pure hypercholesterolemia   ? Routine general medical examination at a health care facility   ? Sleep apnea   ? not using cpap currently  ? Unspecified nonpsychotic mental disorder   ? ?  ?Patient here accompanied by wife with both urinary and cognitive concerns as below.  Recent history is that he was admitted on March 17 after fall at home.  He apparently was caring some food down some steps and lost his footing and fell.  Right hip fracture.  Underwent right hip surgery on the 18th uneventfully.  Was discharged on the 21st to rehab for 20 days and return home April 10.  Has home PT in place currently.  Walking with assistance of a walker and hopes to transition to a cane soon. ? ?1 concern is urine frequency.  He had some in the past.  Tried Myrbetriq but did not see much improvement.  Nocturia 2-5 times per night.  Tries to avoid excessive caffeine.  Has had some urgency symptoms but also occasional slow stream.  No burning with urination.  Would like to see urologist.  No PSA in a few years but previous PSAs have been very low with reading 2019 0.80.   ? ?Other issue is wife has concerns especially regarding some cognitive issues.  He already takes Aricept and has had some memory concerns in the past.  She feels like his decision making is impaired.  Seems to have poor comprehension of things they discussed at times.  She has not seen much in the way of repetitive comments.  He has a  sister diagnosed with Alzheimer disease.  Had a brother with some type of acute cerebral event which they think was probably aneurysm. ? ?Patient does have some hearing concerns and they plan to get his hearing rechecked soon.  They would like to see neurologist. ?He sent B12 and TSH normal. ? ?Their other concern is postoperative anemia.  His hemoglobin dropped to 8.7.  He does not have any significant dizziness.  Does have some fatigue.  They would like to get this rechecked. ? ?Past Medical History:  ?Diagnosis Date  ? Anxiety   ? Atrial fibrillation ([name])   ? past hx  ? Colon polyp   ? Depression   ? GERD (gastroesophageal reflux disease)   ? Gout, unspecified   ? Hypertension   ? Osteoarthrosis, unspecified whether generalized or localized, unspecified site   ? Pneumonia, organism unspecified(486)   ? Pure hypercholesterolemia   ? Routine general medical examination at a health care facility   ? Sleep apnea   ? not using cpap currently  ? Unspecified nonpsychotic mental disorder   ? ?Past Surgical History:  ?Procedure Laterality Date  ? COLONOSCOPY  10/04/2006  ? jacobs  ? TOTAL HIP ARTHROPLASTY Right 03/12/2022  ? Procedure: TOTAL HIP ARTHROPLASTY ANTERIOR APPROACH;  Surgeon: Rod Can, MD;  Location: [facility];  Service: Orthopedics;  Laterality: Right;  ? UPPER GASTROINTESTINAL ENDOSCOPY  2015  ? WISDOM TOOTH EXTRACTION    ? ? reports that he has never smoked. He has never used smokeless tobacco. He reports that he does not drink alcohol and does not use drugs. ?family history includes Arthritis in an other family member; Dementia in his mother; Heart failure in his father; Pneumonia in his father. ?No Known Allergies ? ? ? ?Review of Systems  ?Constitutional:  Negative for chills, fever and weight loss.  ?Respiratory:  Negative for cough.   ?Cardiovascular:  Negative for chest pain.  ?Gastrointestinal:  Negative for blood in stool, constipation, diarrhea, nausea and vomiting.  ?Genitourinary:  Positive  for frequency and urgency. Negative for flank pain and hematuria.  ?Neurological:  Negative for dizziness.  ? ?  ?Objective:  ?  ? ?BP 118/76 (BP Location: Left Arm, Patient Position: Sitting, Cuff Size: Normal)   Pulse 86   Temp 98.2 ?F (36.8 ?C) (Oral)   Ht '6\' 1"'$  (1.854 m)   Wt 201 lb 8 oz (91.4 kg)   SpO2 97%   BMI 26.58 kg/m?  ? ? ?Physical Exam ?Vitals reviewed.  ?Cardiovascular:  ?   Rate and Rhythm: Normal rate and regular rhythm.  ?Pulmonary:  ?   Effort: Pulmonary effort is normal.  ?   Breath sounds: Normal breath sounds.  ?Neurological:  ?   General: No focal deficit present.  ?   Mental Status: He is alert.  ? ? ? ?No results found for any visits on 04/19/22. ? ?Last CBC ?Lab Results  ?Component Value Date  ? WBC 9.4 03/15/2022  ? HGB 9.2 (L) 03/15/2022  ? HCT 28.3 (L) 03/15/2022  ? MCV 100.4 (H) 03/15/2022  ? MCH 32.6 03/15/2022  ? RDW 13.0 03/15/2022  ? PLT 155 03/15/2022  ? ?  ? ?The 10-year ASCVD risk score (Arnett DK, et al., 2019) is: 25.6% ? ?  ?Assessment & Plan:  ? ?#1 nocturia and urine urgency.  Sounds like he may have combination of urgency incontinence and slow stream perhaps from BPH.  They are requesting urology referral. ?-Urology referral placed ?-Avoid anticholinergic medications ?-Previously tried Myrbetriq without much improvement. ?-Avoid late the use of caffeine ? ?#2 postoperative anemia with hemoglobin 8.7. ?-Recheck CBC ? ?#3 cognitive concerns.  Patient's wife requesting neurology referral.  We recommend they first get a good hearing screen as he seems to clearly have some hearing deficits and that needs to be clarified.  We will set up neurology referral.  May need neuropsychology assessment.  Recent B12 and TSH normal.  CT head 2020 showed mild chronic small vessel changes of the basal ganglia and white matter ? ? ?No follow-ups on file.  ? ? ?Carolann Littler, MD ? ?

## 2022-04-19 NOTE — Patient Instructions (Signed)
Set up Audiology assessment ? ?I will set up neurology and urology appointments.  ?

## 2022-04-20 LAB — CBC WITH DIFFERENTIAL/PLATELET
Basophils Absolute: 0.1 10*3/uL (ref 0.0–0.1)
Basophils Relative: 1.3 % (ref 0.0–3.0)
Eosinophils Absolute: 0.2 10*3/uL (ref 0.0–0.7)
Eosinophils Relative: 3.1 % (ref 0.0–5.0)
HCT: 31.4 % — ABNORMAL LOW (ref 39.0–52.0)
Hemoglobin: 10.4 g/dL — ABNORMAL LOW (ref 13.0–17.0)
Lymphocytes Relative: 24.2 % (ref 12.0–46.0)
Lymphs Abs: 1.6 10*3/uL (ref 0.7–4.0)
MCHC: 33 g/dL (ref 30.0–36.0)
MCV: 98 fl (ref 78.0–100.0)
Monocytes Absolute: 0.6 10*3/uL (ref 0.1–1.0)
Monocytes Relative: 8.9 % (ref 3.0–12.0)
Neutro Abs: 4.1 10*3/uL (ref 1.4–7.7)
Neutrophils Relative %: 62.5 % (ref 43.0–77.0)
Platelets: 249 10*3/uL (ref 150.0–400.0)
RBC: 3.21 Mil/uL — ABNORMAL LOW (ref 4.22–5.81)
RDW: 15 % (ref 11.5–15.5)
WBC: 6.6 10*3/uL (ref 4.0–10.5)

## 2022-04-21 DIAGNOSIS — I10 Essential (primary) hypertension: Secondary | ICD-10-CM | POA: Diagnosis not present

## 2022-04-21 DIAGNOSIS — I4891 Unspecified atrial fibrillation: Secondary | ICD-10-CM | POA: Diagnosis not present

## 2022-04-21 DIAGNOSIS — M1990 Unspecified osteoarthritis, unspecified site: Secondary | ICD-10-CM | POA: Diagnosis not present

## 2022-04-21 DIAGNOSIS — M109 Gout, unspecified: Secondary | ICD-10-CM | POA: Diagnosis not present

## 2022-04-21 DIAGNOSIS — W19XXXD Unspecified fall, subsequent encounter: Secondary | ICD-10-CM | POA: Diagnosis not present

## 2022-04-21 DIAGNOSIS — S72001D Fracture of unspecified part of neck of right femur, subsequent encounter for closed fracture with routine healing: Secondary | ICD-10-CM | POA: Diagnosis not present

## 2022-04-22 DIAGNOSIS — S72001D Fracture of unspecified part of neck of right femur, subsequent encounter for closed fracture with routine healing: Secondary | ICD-10-CM | POA: Diagnosis not present

## 2022-04-22 DIAGNOSIS — W19XXXD Unspecified fall, subsequent encounter: Secondary | ICD-10-CM | POA: Diagnosis not present

## 2022-04-22 DIAGNOSIS — M1990 Unspecified osteoarthritis, unspecified site: Secondary | ICD-10-CM | POA: Diagnosis not present

## 2022-04-22 DIAGNOSIS — I10 Essential (primary) hypertension: Secondary | ICD-10-CM | POA: Diagnosis not present

## 2022-04-22 DIAGNOSIS — I4891 Unspecified atrial fibrillation: Secondary | ICD-10-CM | POA: Diagnosis not present

## 2022-04-22 DIAGNOSIS — M109 Gout, unspecified: Secondary | ICD-10-CM | POA: Diagnosis not present

## 2022-04-26 DIAGNOSIS — I10 Essential (primary) hypertension: Secondary | ICD-10-CM | POA: Diagnosis not present

## 2022-04-26 DIAGNOSIS — I4891 Unspecified atrial fibrillation: Secondary | ICD-10-CM | POA: Diagnosis not present

## 2022-04-26 DIAGNOSIS — S72001D Fracture of unspecified part of neck of right femur, subsequent encounter for closed fracture with routine healing: Secondary | ICD-10-CM | POA: Diagnosis not present

## 2022-04-26 DIAGNOSIS — M1990 Unspecified osteoarthritis, unspecified site: Secondary | ICD-10-CM | POA: Diagnosis not present

## 2022-04-26 DIAGNOSIS — M109 Gout, unspecified: Secondary | ICD-10-CM | POA: Diagnosis not present

## 2022-04-26 DIAGNOSIS — S72031D Displaced midcervical fracture of right femur, subsequent encounter for closed fracture with routine healing: Secondary | ICD-10-CM | POA: Diagnosis not present

## 2022-04-26 DIAGNOSIS — W19XXXD Unspecified fall, subsequent encounter: Secondary | ICD-10-CM | POA: Diagnosis not present

## 2022-04-27 DIAGNOSIS — S72001D Fracture of unspecified part of neck of right femur, subsequent encounter for closed fracture with routine healing: Secondary | ICD-10-CM | POA: Diagnosis not present

## 2022-04-27 DIAGNOSIS — I4891 Unspecified atrial fibrillation: Secondary | ICD-10-CM | POA: Diagnosis not present

## 2022-04-27 DIAGNOSIS — I10 Essential (primary) hypertension: Secondary | ICD-10-CM | POA: Diagnosis not present

## 2022-04-27 DIAGNOSIS — M109 Gout, unspecified: Secondary | ICD-10-CM | POA: Diagnosis not present

## 2022-04-27 DIAGNOSIS — M1990 Unspecified osteoarthritis, unspecified site: Secondary | ICD-10-CM | POA: Diagnosis not present

## 2022-04-27 DIAGNOSIS — W19XXXD Unspecified fall, subsequent encounter: Secondary | ICD-10-CM | POA: Diagnosis not present

## 2022-04-28 DIAGNOSIS — M1990 Unspecified osteoarthritis, unspecified site: Secondary | ICD-10-CM | POA: Diagnosis not present

## 2022-04-28 DIAGNOSIS — S72001D Fracture of unspecified part of neck of right femur, subsequent encounter for closed fracture with routine healing: Secondary | ICD-10-CM | POA: Diagnosis not present

## 2022-04-28 DIAGNOSIS — M109 Gout, unspecified: Secondary | ICD-10-CM | POA: Diagnosis not present

## 2022-04-28 DIAGNOSIS — I10 Essential (primary) hypertension: Secondary | ICD-10-CM | POA: Diagnosis not present

## 2022-04-28 DIAGNOSIS — I4891 Unspecified atrial fibrillation: Secondary | ICD-10-CM | POA: Diagnosis not present

## 2022-04-28 DIAGNOSIS — W19XXXD Unspecified fall, subsequent encounter: Secondary | ICD-10-CM | POA: Diagnosis not present

## 2022-05-02 DIAGNOSIS — I10 Essential (primary) hypertension: Secondary | ICD-10-CM | POA: Diagnosis not present

## 2022-05-02 DIAGNOSIS — M1990 Unspecified osteoarthritis, unspecified site: Secondary | ICD-10-CM | POA: Diagnosis not present

## 2022-05-02 DIAGNOSIS — M109 Gout, unspecified: Secondary | ICD-10-CM | POA: Diagnosis not present

## 2022-05-02 DIAGNOSIS — W19XXXD Unspecified fall, subsequent encounter: Secondary | ICD-10-CM | POA: Diagnosis not present

## 2022-05-02 DIAGNOSIS — I4891 Unspecified atrial fibrillation: Secondary | ICD-10-CM | POA: Diagnosis not present

## 2022-05-02 DIAGNOSIS — S72001D Fracture of unspecified part of neck of right femur, subsequent encounter for closed fracture with routine healing: Secondary | ICD-10-CM | POA: Diagnosis not present

## 2022-05-04 DIAGNOSIS — I10 Essential (primary) hypertension: Secondary | ICD-10-CM | POA: Diagnosis not present

## 2022-05-04 DIAGNOSIS — W19XXXD Unspecified fall, subsequent encounter: Secondary | ICD-10-CM | POA: Diagnosis not present

## 2022-05-04 DIAGNOSIS — M109 Gout, unspecified: Secondary | ICD-10-CM | POA: Diagnosis not present

## 2022-05-04 DIAGNOSIS — S72001D Fracture of unspecified part of neck of right femur, subsequent encounter for closed fracture with routine healing: Secondary | ICD-10-CM | POA: Diagnosis not present

## 2022-05-04 DIAGNOSIS — M1990 Unspecified osteoarthritis, unspecified site: Secondary | ICD-10-CM | POA: Diagnosis not present

## 2022-05-04 DIAGNOSIS — I4891 Unspecified atrial fibrillation: Secondary | ICD-10-CM | POA: Diagnosis not present

## 2022-05-05 DIAGNOSIS — S72091D Other fracture of head and neck of right femur, subsequent encounter for closed fracture with routine healing: Secondary | ICD-10-CM | POA: Diagnosis not present

## 2022-05-05 DIAGNOSIS — S72001D Fracture of unspecified part of neck of right femur, subsequent encounter for closed fracture with routine healing: Secondary | ICD-10-CM | POA: Diagnosis not present

## 2022-05-05 DIAGNOSIS — G4733 Obstructive sleep apnea (adult) (pediatric): Secondary | ICD-10-CM | POA: Diagnosis not present

## 2022-05-05 DIAGNOSIS — Z96641 Presence of right artificial hip joint: Secondary | ICD-10-CM | POA: Diagnosis not present

## 2022-05-05 DIAGNOSIS — F0393 Unspecified dementia, unspecified severity, with mood disturbance: Secondary | ICD-10-CM | POA: Diagnosis not present

## 2022-05-05 DIAGNOSIS — Z7982 Long term (current) use of aspirin: Secondary | ICD-10-CM | POA: Diagnosis not present

## 2022-05-05 DIAGNOSIS — R41841 Cognitive communication deficit: Secondary | ICD-10-CM | POA: Diagnosis not present

## 2022-05-05 DIAGNOSIS — M1990 Unspecified osteoarthritis, unspecified site: Secondary | ICD-10-CM | POA: Diagnosis not present

## 2022-05-05 DIAGNOSIS — F32A Depression, unspecified: Secondary | ICD-10-CM | POA: Diagnosis not present

## 2022-05-05 DIAGNOSIS — F0394 Unspecified dementia, unspecified severity, with anxiety: Secondary | ICD-10-CM | POA: Diagnosis not present

## 2022-05-05 DIAGNOSIS — D464 Refractory anemia, unspecified: Secondary | ICD-10-CM | POA: Diagnosis not present

## 2022-05-05 DIAGNOSIS — I4891 Unspecified atrial fibrillation: Secondary | ICD-10-CM | POA: Diagnosis not present

## 2022-05-05 DIAGNOSIS — R350 Frequency of micturition: Secondary | ICD-10-CM | POA: Diagnosis not present

## 2022-05-05 DIAGNOSIS — W19XXXD Unspecified fall, subsequent encounter: Secondary | ICD-10-CM | POA: Diagnosis not present

## 2022-05-05 DIAGNOSIS — Z9181 History of falling: Secondary | ICD-10-CM | POA: Diagnosis not present

## 2022-05-05 DIAGNOSIS — M109 Gout, unspecified: Secondary | ICD-10-CM | POA: Diagnosis not present

## 2022-05-05 DIAGNOSIS — R32 Unspecified urinary incontinence: Secondary | ICD-10-CM | POA: Diagnosis not present

## 2022-05-05 DIAGNOSIS — I10 Essential (primary) hypertension: Secondary | ICD-10-CM | POA: Diagnosis not present

## 2022-05-09 DIAGNOSIS — I4891 Unspecified atrial fibrillation: Secondary | ICD-10-CM | POA: Diagnosis not present

## 2022-05-09 DIAGNOSIS — M1990 Unspecified osteoarthritis, unspecified site: Secondary | ICD-10-CM | POA: Diagnosis not present

## 2022-05-09 DIAGNOSIS — W19XXXD Unspecified fall, subsequent encounter: Secondary | ICD-10-CM | POA: Diagnosis not present

## 2022-05-09 DIAGNOSIS — S72001D Fracture of unspecified part of neck of right femur, subsequent encounter for closed fracture with routine healing: Secondary | ICD-10-CM | POA: Diagnosis not present

## 2022-05-09 DIAGNOSIS — I10 Essential (primary) hypertension: Secondary | ICD-10-CM | POA: Diagnosis not present

## 2022-05-09 DIAGNOSIS — M109 Gout, unspecified: Secondary | ICD-10-CM | POA: Diagnosis not present

## 2022-05-11 DIAGNOSIS — M109 Gout, unspecified: Secondary | ICD-10-CM | POA: Diagnosis not present

## 2022-05-11 DIAGNOSIS — I10 Essential (primary) hypertension: Secondary | ICD-10-CM | POA: Diagnosis not present

## 2022-05-11 DIAGNOSIS — S72001D Fracture of unspecified part of neck of right femur, subsequent encounter for closed fracture with routine healing: Secondary | ICD-10-CM | POA: Diagnosis not present

## 2022-05-11 DIAGNOSIS — I4891 Unspecified atrial fibrillation: Secondary | ICD-10-CM | POA: Diagnosis not present

## 2022-05-11 DIAGNOSIS — W19XXXD Unspecified fall, subsequent encounter: Secondary | ICD-10-CM | POA: Diagnosis not present

## 2022-05-11 DIAGNOSIS — M1990 Unspecified osteoarthritis, unspecified site: Secondary | ICD-10-CM | POA: Diagnosis not present

## 2022-05-17 ENCOUNTER — Telehealth: Payer: Self-pay | Admitting: Family Medicine

## 2022-05-17 ENCOUNTER — Ambulatory Visit: Payer: Medicare Other | Admitting: Neurology

## 2022-05-17 DIAGNOSIS — I4891 Unspecified atrial fibrillation: Secondary | ICD-10-CM | POA: Diagnosis not present

## 2022-05-17 DIAGNOSIS — M1990 Unspecified osteoarthritis, unspecified site: Secondary | ICD-10-CM | POA: Diagnosis not present

## 2022-05-17 DIAGNOSIS — H903 Sensorineural hearing loss, bilateral: Secondary | ICD-10-CM | POA: Diagnosis not present

## 2022-05-17 DIAGNOSIS — I10 Essential (primary) hypertension: Secondary | ICD-10-CM | POA: Diagnosis not present

## 2022-05-17 DIAGNOSIS — W19XXXD Unspecified fall, subsequent encounter: Secondary | ICD-10-CM | POA: Diagnosis not present

## 2022-05-17 DIAGNOSIS — S72001D Fracture of unspecified part of neck of right femur, subsequent encounter for closed fracture with routine healing: Secondary | ICD-10-CM | POA: Diagnosis not present

## 2022-05-17 DIAGNOSIS — M109 Gout, unspecified: Secondary | ICD-10-CM | POA: Diagnosis not present

## 2022-05-17 DIAGNOSIS — Z0110 Encounter for examination of ears and hearing without abnormal findings: Secondary | ICD-10-CM

## 2022-05-17 NOTE — Telephone Encounter (Signed)
Pt is at [facility] now 05-17-2022 and is having a hearing test due to hearing loss. Pt has medicare and they need a referral. [facility]  phone number is [phone] and  [name] does not know the fax number

## 2022-05-18 NOTE — Telephone Encounter (Signed)
Pt's wife informed that referral has been placed

## 2022-05-25 DIAGNOSIS — I4891 Unspecified atrial fibrillation: Secondary | ICD-10-CM | POA: Diagnosis not present

## 2022-05-25 DIAGNOSIS — M109 Gout, unspecified: Secondary | ICD-10-CM | POA: Diagnosis not present

## 2022-05-25 DIAGNOSIS — I10 Essential (primary) hypertension: Secondary | ICD-10-CM | POA: Diagnosis not present

## 2022-05-25 DIAGNOSIS — W19XXXD Unspecified fall, subsequent encounter: Secondary | ICD-10-CM | POA: Diagnosis not present

## 2022-05-25 DIAGNOSIS — M1990 Unspecified osteoarthritis, unspecified site: Secondary | ICD-10-CM | POA: Diagnosis not present

## 2022-05-25 DIAGNOSIS — S72001D Fracture of unspecified part of neck of right femur, subsequent encounter for closed fracture with routine healing: Secondary | ICD-10-CM | POA: Diagnosis not present

## 2022-05-26 DIAGNOSIS — M1990 Unspecified osteoarthritis, unspecified site: Secondary | ICD-10-CM | POA: Diagnosis not present

## 2022-05-26 DIAGNOSIS — S72001D Fracture of unspecified part of neck of right femur, subsequent encounter for closed fracture with routine healing: Secondary | ICD-10-CM | POA: Diagnosis not present

## 2022-05-26 DIAGNOSIS — I4891 Unspecified atrial fibrillation: Secondary | ICD-10-CM | POA: Diagnosis not present

## 2022-05-26 DIAGNOSIS — W19XXXD Unspecified fall, subsequent encounter: Secondary | ICD-10-CM | POA: Diagnosis not present

## 2022-05-26 DIAGNOSIS — M109 Gout, unspecified: Secondary | ICD-10-CM | POA: Diagnosis not present

## 2022-05-26 DIAGNOSIS — I10 Essential (primary) hypertension: Secondary | ICD-10-CM | POA: Diagnosis not present

## 2022-05-27 DIAGNOSIS — M109 Gout, unspecified: Secondary | ICD-10-CM | POA: Diagnosis not present

## 2022-05-27 DIAGNOSIS — M1990 Unspecified osteoarthritis, unspecified site: Secondary | ICD-10-CM | POA: Diagnosis not present

## 2022-05-27 DIAGNOSIS — S72001D Fracture of unspecified part of neck of right femur, subsequent encounter for closed fracture with routine healing: Secondary | ICD-10-CM | POA: Diagnosis not present

## 2022-05-27 DIAGNOSIS — I4891 Unspecified atrial fibrillation: Secondary | ICD-10-CM | POA: Diagnosis not present

## 2022-05-27 DIAGNOSIS — I10 Essential (primary) hypertension: Secondary | ICD-10-CM | POA: Diagnosis not present

## 2022-05-27 DIAGNOSIS — W19XXXD Unspecified fall, subsequent encounter: Secondary | ICD-10-CM | POA: Diagnosis not present

## 2022-05-30 DIAGNOSIS — M109 Gout, unspecified: Secondary | ICD-10-CM | POA: Diagnosis not present

## 2022-05-30 DIAGNOSIS — S72001D Fracture of unspecified part of neck of right femur, subsequent encounter for closed fracture with routine healing: Secondary | ICD-10-CM | POA: Diagnosis not present

## 2022-05-30 DIAGNOSIS — M1990 Unspecified osteoarthritis, unspecified site: Secondary | ICD-10-CM | POA: Diagnosis not present

## 2022-05-30 DIAGNOSIS — I4891 Unspecified atrial fibrillation: Secondary | ICD-10-CM | POA: Diagnosis not present

## 2022-05-30 DIAGNOSIS — W19XXXD Unspecified fall, subsequent encounter: Secondary | ICD-10-CM | POA: Diagnosis not present

## 2022-05-30 DIAGNOSIS — I10 Essential (primary) hypertension: Secondary | ICD-10-CM | POA: Diagnosis not present

## 2022-05-31 DIAGNOSIS — I4891 Unspecified atrial fibrillation: Secondary | ICD-10-CM | POA: Diagnosis not present

## 2022-05-31 DIAGNOSIS — I10 Essential (primary) hypertension: Secondary | ICD-10-CM | POA: Diagnosis not present

## 2022-05-31 DIAGNOSIS — S72001D Fracture of unspecified part of neck of right femur, subsequent encounter for closed fracture with routine healing: Secondary | ICD-10-CM | POA: Diagnosis not present

## 2022-05-31 DIAGNOSIS — W19XXXD Unspecified fall, subsequent encounter: Secondary | ICD-10-CM | POA: Diagnosis not present

## 2022-05-31 DIAGNOSIS — M1990 Unspecified osteoarthritis, unspecified site: Secondary | ICD-10-CM | POA: Diagnosis not present

## 2022-05-31 DIAGNOSIS — M109 Gout, unspecified: Secondary | ICD-10-CM | POA: Diagnosis not present

## 2022-06-06 ENCOUNTER — Encounter: Payer: Self-pay | Admitting: Psychiatry

## 2022-06-06 ENCOUNTER — Ambulatory Visit (INDEPENDENT_AMBULATORY_CARE_PROVIDER_SITE_OTHER): Payer: Medicare Other | Admitting: Psychiatry

## 2022-06-06 DIAGNOSIS — F422 Mixed obsessional thoughts and acts: Secondary | ICD-10-CM | POA: Diagnosis not present

## 2022-06-06 DIAGNOSIS — F3342 Major depressive disorder, recurrent, in full remission: Secondary | ICD-10-CM

## 2022-06-06 DIAGNOSIS — F411 Generalized anxiety disorder: Secondary | ICD-10-CM | POA: Diagnosis not present

## 2022-06-06 DIAGNOSIS — G3184 Mild cognitive impairment, so stated: Secondary | ICD-10-CM | POA: Diagnosis not present

## 2022-06-06 DIAGNOSIS — R7989 Other specified abnormal findings of blood chemistry: Secondary | ICD-10-CM

## 2022-06-06 MED ORDER — SERTRALINE HCL 100 MG PO TABS: 200.0000 mg | ORAL_TABLET | Freq: Every day | ORAL | 1 refills | Status: DC

## 2022-06-06 MED ORDER — DONEPEZIL HCL 10 MG PO TABS: 10.0000 mg | ORAL_TABLET | Freq: Every day | ORAL | 3 refills | Status: DC

## 2022-06-06 MED ORDER — LITHIUM CARBONATE 150 MG PO CAPS: 450.0000 mg | ORAL_CAPSULE | Freq: Every day | ORAL | 1 refills | Status: DC

## 2022-06-06 NOTE — Progress Notes (Signed)
Jason TANZI [id number] 1941-12-08 81 y.o.  Subjective:   Patient ID:  Jason [name] is a 81 y.o. (DOB 1941/11/25) male.  Chief Complaint:  Chief Complaint  Patient presents with   Follow-up   Depression   Anxiety    Depression        Associated symptoms include no decreased concentration and no suicidal ideas.  Past medical history includes anxiety.   Anxiety Symptoms include nervous/anxious behavior. Patient reports no confusion, decreased concentration, palpitations or suicidal ideas.       Jason [name] presents to the office today for follow-up of MDE, OCD and GAD.  seen August, 2020.  No meds were changed.  5/5//21  Seen with wife Jason [name]. Had mild Covid in fall 2020 and got the vaccine.   CO forgetfulness with names and places but not getting words out correctly.   Wife notices the same but she's not overly concerned but she has noticed occ gets confused about people. PCP checked b12 normal bc pt brought up memory concern.   11/16/20 appt with following noted:  Seen with wife Jason [name] Still doing well. Occ spellls of anxiety randomly.  Does better if doesn't forget meds. No SE.  Memory trouble limited mainly to names.  Not much worse.  OK otherwise. Depression can occur but only last a day.  Depression and anxiety under control.  Except notices Feb each year a change in disposition but it's minor.  Patient reports stable mood and denies depressed or irritable moods.   Patient denies difficulty with sleep initiation or maintenance but it is split with some napping.  EMA. Denies appetite disturbance.  Patient reports that energy and motivation have been good.  Patient denies any difficulty with concentration.  Patient denies any suicidal ideation. Stress caring for inlaws in their 90s.  F good cognition.  M cognitive problems. Plan: Patient's lithium level was 0.51-year ago on 600 mg daily.  Level on 11/28/2020 was 0.9 on 600 mg daily.  The lithium level has crept up a little bit.  I  am concerned it may start causing side effects.  Therefore a prescription has been sent so that he can reduce the dose to 450 mg daily by using 3 of the 150 mg capsules.  06/10/2021 appointment with the following noted: seen with wife Doing fine and no problems with reduction in lithium to 450 mg daily. Frustration and down with world and politics and can interfere with sleep lately better. Staying up later and more irregular pattern.  Will nap at times. Questions about meds Plan no changes  12/09/21 appt noted: seen with wife Sertraline 200 mg daily and lithium 450 mg and donepezil 10 mg HS, Cerefolin NAC Consistent with meds.  No SE Doing well for 81 yo. Patient reports stable mood and denies depressed or irritable moods.  Patient denies any recent difficulty with anxiety.  Patient denies difficulty with sleep initiation or maintenance. Denies appetite disturbance.  Patient reports that energy and motivation have been good.  Patient denies any difficulty with concentration.  Patient denies any suicidal ideation. Helping homeless families. Plan: High relapse risk if reduce meds.  But rarely high dose sertraline can interfere with cognition but he's at risk with reduction. No med changes. Continue lithium 450 mg daily Sertraline 200 Aricept 10  06/06/22 appt noted: seen with wife per usual Doing fine except fell off porch and hip fx with replacement since here. Recovery has gone well.  After surgery in March.  Finished PT  at home and now some outpt PT. Dr. Dot Lanes.  Pleased with him. Not depressed.  Patient reports stable mood and denies depressed or irritable moods.  Patient denies any recent difficulty with anxiety.  Patient denies difficulty with sleep initiation or maintenance. Denies appetite disturbance.  Patient reports that energy and motivation have been good.  Patient has mild forgetfulness. Patient denies any suicidal ideation. No SE problems.  Past Psychiatric Medication  Trials: Wellbutrin side effects, Lexapro nausea,  paroxetine, fluoxetine briefly, duloxetine, , Effexor with a history of benefit, mirtazapine, Pristiq 100, sertraline 200,  Abilify,  buspirone side effects, lithium 600, olanzapine 2.5 with benefit. ECT  Review of Systems:  Review of Systems  HENT:  Positive for hearing loss.   Respiratory:  Negative for chest tightness.   Cardiovascular:  Negative for palpitations.  Musculoskeletal:  Positive for arthralgias and back pain.  Neurological:  Negative for tremors and weakness.  Psychiatric/Behavioral:  Positive for depression. Negative for agitation, behavioral problems, confusion, decreased concentration, dysphoric mood, hallucinations, self-injury, sleep disturbance and suicidal ideas. The patient is nervous/anxious. The patient is not hyperactive.     Medications: I have reviewed the patient's current medications.  Current Outpatient Medications  Medication Sig Dispense Refill   Acetylcysteine (NAC) 500 MG CAPS Take by mouth.     amLODipine (NORVASC) 5 MG tablet TAKE 1 TABLET BY MOUTH EVERY DAY 90 tablet 1   Multiple Vitamins-Minerals (MULTIVITAMIN ADULTS) TABS Take 1 tablet by mouth daily.     simvastatin (ZOCOR) 20 MG tablet Take 1 tablet (20 mg total) by mouth daily. 90 tablet 3   Vitamin D, Ergocalciferol, (DRISDOL) 1.25 MG (50000 UNIT) CAPS capsule TAKE 1 CAPSULE EVERY 7 DAYS 12 capsule 1   donepezil (ARICEPT) 10 MG tablet Take 1 tablet (10 mg total) by mouth at bedtime. 90 tablet 3   lithium carbonate 150 MG capsule Take 3 capsules (450 mg total) by mouth daily. 270 capsule 1   sertraline (ZOLOFT) 100 MG tablet Take 2 tablets (200 mg total) by mouth daily. 180 tablet 1   No current facility-administered medications for this visit.    Medication Side Effects: None except occ jerks from lithium  Allergies: No Known Allergies  Past Medical History:  Diagnosis Date   Anxiety    Atrial fibrillation ([facility])    past hx   Colon  polyp    Depression    GERD (gastroesophageal reflux disease)    Gout, unspecified    Hypertension    Osteoarthrosis, unspecified whether generalized or localized, unspecified site    Pneumonia, organism unspecified(486)    Pure hypercholesterolemia    Routine general medical examination at a health care facility    Sleep apnea    not using cpap currently   Unspecified nonpsychotic mental disorder     Family History  Problem Relation Age of Onset   Dementia Mother    Pneumonia Father    Heart failure Father    Arthritis Other    Colon cancer Neg Hx    Colon polyps Neg Hx    Esophageal cancer Neg Hx    Stomach cancer Neg Hx    Rectal cancer Neg Hx     Social History   Socioeconomic History   Marital status: Married    Spouse name: Jason [name]   Number of children: 0   Years of education: Not on file   Highest education level: Not on file  Occupational History   Occupation: retired    Fish farm manager: [employer]  Tobacco Use   Smoking status: Never   Smokeless tobacco: Never  Vaping Use   Vaping Use: Never used  Substance and Sexual Activity   Alcohol use: No   Drug use: No   Sexual activity: Not on file  Other Topics Concern   Not on file  Social History Narrative   Not on file   Social Determinants of Health   Financial Resource Strain: Not on file  Food Insecurity: Not on file  Transportation Needs: Not on file  Physical Activity: Not on file  Stress: Not on file  Social Connections: Not on file  Intimate Partner Violence: Not on file    Past Medical History, Surgical history, Social history, and Family history were reviewed and updated as appropriate.   Please see review of systems for further details on the patient's review from today.   Objective:   Physical Exam:  There were no vitals taken for this visit.  Physical Exam Constitutional:      General: He is not in acute distress.    Appearance: He is well-developed.  Musculoskeletal:        General:  No deformity.  Neurological:     Mental Status: He is alert and oriented to person, place, and time.     Motor: No tremor.     Coordination: Coordination normal.  Psychiatric:        Attention and Perception: Attention normal. He is attentive. He does not perceive auditory hallucinations.        Mood and Affect: Mood normal. Mood is not anxious or depressed. Affect is not labile, blunt, angry or inappropriate.        Speech: Speech normal. Speech is not slurred.        Behavior: Behavior normal. Behavior is not slowed.        Thought Content: Thought content normal. Thought content is not delusional. Thought content does not include homicidal or suicidal ideation. Thought content does not include suicidal plan.        Cognition and Memory: Cognition normal. He exhibits impaired recent memory.        Judgment: Judgment normal.     Comments: Insight is fair to good. Positive affect     Lab Review:     Component Value Date/Time   NA 136 03/14/2022 0528   K 3.9 03/14/2022 0528   CL 107 03/14/2022 0528   CO2 24 03/14/2022 0528   GLUCOSE 124 (H) 03/14/2022 0528   BUN 21 03/14/2022 0528   CREATININE 0.86 03/14/2022 0528   CALCIUM 8.8 (L) 03/14/2022 0528   PROT 7.0 02/02/2022 0952   ALBUMIN 3.0 (L) 03/14/2022 0528   AST 23 02/02/2022 0952   ALT 17 02/02/2022 0952   ALKPHOS 72 02/02/2022 0952   BILITOT 0.6 02/02/2022 0952   GFRNONAA >60 03/14/2022 0528   GFRAA >60 04/19/2020 1247       Component Value Date/Time   WBC 6.6 04/19/2022 1511   RBC 3.21 (L) 04/19/2022 1511   HGB 10.4 (L) 04/19/2022 1511   HCT 31.4 (L) 04/19/2022 1511   PLT 249.0 04/19/2022 1511   MCV 98.0 04/19/2022 1511   MCH 32.6 03/15/2022 0454   MCHC 33.0 04/19/2022 1511   RDW 15.0 04/19/2022 1511   LYMPHSABS 1.6 04/19/2022 1511   MONOABS 0.6 04/19/2022 1511   EOSABS 0.2 04/19/2022 1511   BASOSABS 0.1 04/19/2022 1511   Normal TSH in September.  Last lithium 0.8 in July 2019  Lithium level January 10, 2027 0.5  Lithium Lvl  Date Value Ref Range Status  01/04/2022 0.6 0.6 - 1.2 mmol/L Final   01/04/22 lithium level 0.6 on 450 mg daily.  No results found for: "PHENYTOIN", "PHENOBARB", "VALPROATE", "CBMZ"   .res Assessment: Plan:    Recurrent major depression in complete remission ([place]) - Plan: lithium carbonate 150 MG capsule, sertraline (ZOLOFT) 100 MG tablet  Mixed obsessional thoughts and acts - Plan: sertraline (ZOLOFT) 100 MG tablet  Generalized anxiety disorder - Plan: sertraline (ZOLOFT) 100 MG tablet  Mild cognitive impairment - Plan: donepezil (ARICEPT) 10 MG tablet  Low vitamin D level   Mr. Regis has a long history of the above diagnoses which have been treatment resistant at times and multiple relapses.  He ultimately responded to the combination of sertraline and lithium and Zyprexa.  He has been able to wean off the Zyprexa without complication.  He's continuing to do well.  As noted multiple failed meds and a history of treatment resistance. Each diagnosis under control and meds helping.   Extensive disucussion of sleep hygiene.  Also has nocturia.  Agree with pursuing hearing aids given cognitive concerns.  Then consider neuro work up.  Supportive therapy dealing with sick in-laws and caretaking.  Encourage self-care and recreation.  Also dealing with homeless people.  Counseled patient regarding potential benefits, risks, and side effects of lithium to include potential risk of lithium affecting thyroid and renal function.  Discussed need for periodic lab monitoring to determine drug level and to assess for potential adverse effects.  Counseled patient regarding signs and symptoms of lithium toxicity and advised that they notify office immediately or seek urgent medical attention if experiencing these signs and symptoms.  Patient advised to contact office with any questions or concerns.  Call if any changes in BP meds.  Disc DDI  Lithium level is in the low normal  range and is typical for the levels he has had over the last couple of years.  Labs good.  Disc risk s and TSH also normal. Has normal D on 50K weekly. and B12 levels Reduced lithium from 600 to 450 Dec 2021 bc Cr creep without problems. 03/14/22 Cr. 0.8. Continue lithium to AM to see if nocturia is helped.  01/04/22 lithium level on 450 mg daily.  Disc the difference between Cerefolin NAC and Metafolbic plus angain in detail.Marland Kitchen option switch to NAC & B complex  Disc donepezil 10 mg nightly.  Does not have Alzheimer's but is bothered enough with forgetfulness to warrant the benefit.  Good grasp of politics.  Did a little worse right after surgery but improving.  Answered questions about OTC memory meds.  High relapse risk if reduce meds.  But rarely high dose sertraline can interfere with cognition but he's at risk with reduction. No med changes. Continue lithium 450 mg daily Sertraline 200 Aricept 10 Continue Vitamin D with level in 50s This appt was 30 mins.  FU 6 mos  Lynder Parents, MD, DFAPA     Please see After Visit Summary for patient specific instructions.  Future Appointments  Date Time Provider [place]  06/15/2022  2:15 PM Alric Ran, MD [facility] None     No orders of the defined types were placed in this encounter.     -------------------------------

## 2022-06-13 ENCOUNTER — Other Ambulatory Visit: Payer: Self-pay | Admitting: Family Medicine

## 2022-06-14 ENCOUNTER — Other Ambulatory Visit: Payer: Self-pay | Admitting: Family Medicine

## 2022-06-15 ENCOUNTER — Ambulatory Visit: Payer: Medicare Other | Admitting: Neurology

## 2022-06-16 ENCOUNTER — Telehealth: Payer: Self-pay | Admitting: Family Medicine

## 2022-06-16 NOTE — Telephone Encounter (Signed)
Jason [name] of [facility] called. Pt was discharged from home PT. Outpatient PT is recommended. Please send referral to: [facility] @ [address] Phone: [phone]

## 2022-06-17 ENCOUNTER — Other Ambulatory Visit: Payer: Self-pay

## 2022-06-17 DIAGNOSIS — M1990 Unspecified osteoarthritis, unspecified site: Secondary | ICD-10-CM

## 2022-06-17 NOTE — Telephone Encounter (Signed)
Referral for physical therapy placed to be sent to [facility] at [facility].   Lvm for Tresa Endo, also spoke with patient wife [name].

## 2022-07-04 ENCOUNTER — Ambulatory Visit: Payer: Medicare Other | Attending: Family Medicine | Admitting: Rehabilitative and Restorative Service Providers"

## 2022-07-04 ENCOUNTER — Encounter: Payer: Self-pay | Admitting: Rehabilitative and Restorative Service Providers"

## 2022-07-04 ENCOUNTER — Other Ambulatory Visit: Payer: Self-pay

## 2022-07-04 DIAGNOSIS — R2689 Other abnormalities of gait and mobility: Secondary | ICD-10-CM | POA: Insufficient documentation

## 2022-07-04 DIAGNOSIS — M1990 Unspecified osteoarthritis, unspecified site: Secondary | ICD-10-CM | POA: Diagnosis not present

## 2022-07-04 DIAGNOSIS — M6281 Muscle weakness (generalized): Secondary | ICD-10-CM | POA: Insufficient documentation

## 2022-07-04 NOTE — Therapy (Signed)
OUTPATIENT PHYSICAL THERAPY LOWER EXTREMITY EVALUATION   Patient Name: Jason [name] MRN: [id number] DOB:Jun 02, 1941, 81 y.o., male Today's Date: 07/04/2022   PT End of Session - 07/04/22 1100     Visit Number 1    Date for PT Re-Evaluation 08/26/22    Authorization Type Medicare A    Progress Note Due on Visit 10    PT Start Time 1055    PT Stop Time 1135    PT Time Calculation (min) 40 min    Activity Tolerance Patient tolerated treatment well    Behavior During Therapy WFL for tasks assessed/performed             Past Medical History:  Diagnosis Date   Anxiety    Atrial fibrillation ([place])    past hx   Colon polyp    Depression    GERD (gastroesophageal reflux disease)    Gout, unspecified    Hypertension    Osteoarthrosis, unspecified whether generalized or localized, unspecified site    Pneumonia, organism unspecified(486)    Pure hypercholesterolemia    Routine general medical examination at a health care facility    Sleep apnea    not using cpap currently   Unspecified nonpsychotic mental disorder    Past Surgical History:  Procedure Laterality Date   COLONOSCOPY  10/04/2006   jacobs   TOTAL HIP ARTHROPLASTY Right 03/12/2022   Procedure: TOTAL HIP ARTHROPLASTY ANTERIOR APPROACH;  Surgeon: Rod Can, MD;  Location: [facility];  Service: Orthopedics;  Laterality: Right;   UPPER GASTROINTESTINAL ENDOSCOPY  2015   WISDOM TOOTH EXTRACTION     Patient Active Problem List   Diagnosis Date Noted   Acute blood loss as cause of postoperative anemia 03/13/2022   Hip fracture ([place]) 03/12/2022   Accidental fall 03/12/2022   Cognitive impairment 03/12/2022   Personal history of colonic polyps 04/21/2021   Pressure ulcer of left foot 05/14/2020   Pressure ulcer of right foot 05/14/2020   Pain in left foot 05/08/2020   Pain in right foot 05/08/2020   Bilateral impacted cerumen 03/10/2020   Bilateral sensorineural hearing loss 03/10/2020   GAD (generalized anxiety  disorder) 12/23/2018   At high risk for falls 09/03/2018   Mood disorder ([place]) 08/18/2015   OSA (obstructive sleep apnea) 01/19/2015   Vitamin D deficiency 01/07/2013   Need for prophylactic vaccination and inoculation against influenza 11/14/2011   Hypertension 11/14/2011   Osteoarthritis 11/20/2009   PSYCHIATRIC DISORDER 07/16/2008   History of pneumonia 07/16/2008   History of atrial fibrillation    HYPERCHOLESTEROLEMIA     PCP: [name], MD   REFERRING PROVIDER: [name], MD   REFERRING DIAG: M19.90 (ICD-10-CM) - Osteoarthritis, unspecified osteoarthritis type, unspecified site   THERAPY DIAG:  Muscle weakness (generalized) - Plan: PT plan of care cert/re-cert  Other abnormalities of gait and mobility - Plan: PT plan of care cert/re-cert  Balance problem - Plan: PT plan of care cert/re-cert  Rationale for Evaluation and Treatment Rehabilitation  ONSET DATE: 03/12/2022  SUBJECTIVE:   SUBJECTIVE STATEMENT: Pt presents with his wife to PT.  Pt reports that he was doing home health following his fall with R hip Fx and subsequent R THA.  He has been doing some of his exercises since that time, but wanted to come to outpatient to continue his progress.  PERTINENT HISTORY: Mood disorder, Anxiety, HTN, OA  PAIN:  Are you having pain? Yes: NPRS scale: 2-3/10 Pain location: right hip Pain description: aching Aggravating  factors: unknown Relieving factors: unknown  PRECAUTIONS: Anterior hip and Fall  WEIGHT BEARING RESTRICTIONS No  FALLS:  Has patient fallen in last 6 months? Yes. Number of falls 1 when pt fell off front porch when he was carrying boxes and misstepped  LIVING ENVIRONMENT: Lives with: lives with their spouse Lives in: House/apartment Stairs: Yes: External: 3 steps; on left going up Has following equipment at home: Walker - 2 wheeled, shower chair, bed side commode, and Grab bars  OCCUPATION: retired  PLOF: Independent and  Leisure: play golf, go to football games, go to church  PATIENT GOALS:  To be able to walk outside and return to other activities with less difficulty.   OBJECTIVE:   DIAGNOSTIC FINDINGS: 03/11/2022 Hip CT Scan:   IMPRESSION: 1. Acute minimally impacted right subcapital femoral neck fracture. 2. Mild-to-moderate right hip osteoarthritis.  PATIENT SURVEYS:  07/04/2022: FOTO 58% (projected 69% by visit 13)  COGNITION:  Overall cognitive status: Impaired Pt reports that memory is not as good as has been.   SENSATION: Reports some tingling and impaired sensation in his lower legs  MUSCLE LENGTH: Tightness in hamstrings and tight hip flexors  POSTURE: rounded shoulders and forward head  PALPATION: Pt with some tenderness to palpation along right hip  LOWER EXTREMITY MMT: 07/04/2022: R hip strength grossly 4 to 4+/5, L hip strength of 5/5  FUNCTIONAL TESTS:  07/04/2022: 5 times sit to stand: 18.3 sec with pushing up on handrails Timed up and go (TUG): 12.4 without assistive device  GAIT: Distance walked: 100 ft Assistive device utilized: None Level of assistance: SBA Comments: Unsteady gait pattern noted    TODAY'S TREATMENT: 07/04/2022:   Seated: heel/toe raises, marching, LAQ Sit to/from stand x5 reps   PATIENT EDUCATION:  Education details: Pt issued HEP Person educated: Patient and Spouse Education method: Explanation, Demonstration, and Handouts Education comprehension: verbalized understanding and returned demonstration   HOME EXERCISE PROGRAM: Access Code: [email or web address] URL: [email or web address] Date: 07/04/2022 Prepared by: Juel Burrow  Exercises - Seated Heel Toe Raises  - 1 x daily - 7 x weekly - 2 sets - 10 reps - Seated March  - 1 x daily - 7 x weekly - 2 sets - 10 reps - Seated Long Arc Quad  - 1 x daily - 7 x weekly - 2 sets - 10 reps - Seated Hamstring Stretch  - 1 x daily - 7 x weekly - 1 sets - 2 reps - 20 sec hold - Sit to Stand  with Armchair  - 1 x daily - 7 x weekly - 2 sets - 5 reps  ASSESSMENT:  CLINICAL IMPRESSION: Patient is a 81 y.o. male who was seen today for physical therapy evaluation and treatment for s/p R THA secondary to a fall on 03/11/2022. Pt was in his usual health of being independent and able to complete activities without assistance until he lost his footing when carrying boxes and fell down the 3 steps in the front of his home.  Pt sustained a R femoral neck fracture on 03/11/22 and underwent a R THA using anterior approach by Dr Rod Can on 03/12/22 and he went through subsequent rehab. Pt most recently completed his home health in June and states that he is still having some weakness, decreased balance, and difficulty walking and was referred to outpatient PT to address.  Pt presents with some hip pain that he was told by Dr Lyla Glassing was bursitis.  Pt has right hip weakness, hamstring weakness, and instability  of gait noted with TUG.  Pt would benefit from skilled outpatient PT to address his functional impairments to allow him to return to his prior independent lifestyle and decrease his further risk of falling.   OBJECTIVE IMPAIRMENTS decreased balance, difficulty walking, decreased strength, decreased safety awareness, impaired flexibility, and pain.   ACTIVITY LIMITATIONS squatting, stairs, and locomotion level  PARTICIPATION LIMITATIONS: community activity and yard work  PERSONAL FACTORS Age, Time since onset of injury/illness/exacerbation, and 1 comorbidity: OA  are also affecting patient's functional outcome.   REHAB POTENTIAL: Good  CLINICAL DECISION MAKING: Evolving/moderate complexity  EVALUATION COMPLEXITY: Moderate   GOALS: Goals reviewed with patient? Yes  SHORT TERM GOALS: Target date: 07/25/2022  Pt and spouse will be independent with initial HEP. Baseline: Goal status: INITIAL  2.  Pt will report at least a 30% improvements in symptoms since starting PT. Baseline:   Goal status: INITIAL   LONG TERM GOALS: Target date: 08/29/2022   Pt and spouse will be independent with advanced HEP. Baseline:  Goal status: INITIAL  2.  Pt will increase FOTO score to at least 69% to demonstrate improvements in functional mobility. Baseline: 58% Goal status: INITIAL  3.  Pt will increase right hip strength to at least 5-/5 to allow him to navigate steps with reciprocal pattern. Baseline:  Goal status: INITIAL  4.  Pt will be able to perform 5 times sit to/from stand without use of UE to demonstrate improved functional strength. Baseline:  Goal status: INITIAL  5.  Pt will report pain no greater than 2/10 during errands and other tasks in the community. Baseline:  Goal status: INITIAL   PLAN: PT FREQUENCY: 2x/week  PT DURATION: 8 weeks  PLANNED INTERVENTIONS: Therapeutic exercises, Therapeutic activity, Neuromuscular re-education, Balance training, Gait training, Patient/Family education, Joint manipulation, Joint mobilization, Stair training, Aquatic Therapy, Dry Needling, Electrical stimulation, Cryotherapy, Moist heat, Taping, Ultrasound, Ionotophoresis '4mg'$ /ml Dexamethasone, Manual therapy, and Re-evaluation  PLAN FOR NEXT SESSION: assess and progress HEP as indicated, strengthening, flexibility   Juel Burrow, PT 07/04/2022, 2:04 PM   [facility] [address] [place], [place] [address] Phone # [phone] Fax [phone]

## 2022-07-05 ENCOUNTER — Other Ambulatory Visit: Payer: Self-pay | Admitting: Psychiatry

## 2022-07-08 ENCOUNTER — Ambulatory Visit: Payer: Medicare Other | Admitting: Physical Therapy

## 2022-07-13 ENCOUNTER — Ambulatory Visit: Payer: Medicare Other

## 2022-07-13 DIAGNOSIS — M6281 Muscle weakness (generalized): Secondary | ICD-10-CM | POA: Diagnosis not present

## 2022-07-13 DIAGNOSIS — R2689 Other abnormalities of gait and mobility: Secondary | ICD-10-CM

## 2022-07-13 DIAGNOSIS — M25651 Stiffness of right hip, not elsewhere classified: Secondary | ICD-10-CM

## 2022-07-13 DIAGNOSIS — M1990 Unspecified osteoarthritis, unspecified site: Secondary | ICD-10-CM | POA: Diagnosis not present

## 2022-07-13 NOTE — Therapy (Signed)
OUTPATIENT PHYSICAL THERAPY TREATMENT NOTE   Patient Name: Jason [name] MRN: [id number] DOB:[birth year], 81 y.o., male Today's Date: 07/13/2022  PCP: [name], MD REFERRING PROVIDER: [name], MD  END OF SESSION:   PT End of Session - 07/13/22 1155     Visit Number 2    Date for PT Re-Evaluation 08/26/22    Authorization Type Medicare A    PT Start Time 1148    PT Stop Time 1230    PT Time Calculation (min) 42 min    Activity Tolerance Patient tolerated treatment well    Behavior During Therapy WFL for tasks assessed/performed             Past Medical History:  Diagnosis Date   Anxiety    Atrial fibrillation ([place])    past hx   Colon polyp    Depression    GERD (gastroesophageal reflux disease)    Gout, unspecified    Hypertension    Osteoarthrosis, unspecified whether generalized or localized, unspecified site    Pneumonia, organism unspecified(486)    Pure hypercholesterolemia    Routine general medical examination at a health care facility    Sleep apnea    not using cpap currently   Unspecified nonpsychotic mental disorder    Past Surgical History:  Procedure Laterality Date   COLONOSCOPY  10/04/2006   jacobs   TOTAL HIP ARTHROPLASTY Right 03/12/2022   Procedure: TOTAL HIP ARTHROPLASTY ANTERIOR APPROACH;  Surgeon: Rod Can, MD;  Location: [facility];  Service: Orthopedics;  Laterality: Right;   UPPER GASTROINTESTINAL ENDOSCOPY  2015   WISDOM TOOTH EXTRACTION     Patient Active Problem List   Diagnosis Date Noted   Acute blood loss as cause of postoperative anemia 03/13/2022   Hip fracture ([place]) 03/12/2022   Accidental fall 03/12/2022   Cognitive impairment 03/12/2022   Personal history of colonic polyps 04/21/2021   Pressure ulcer of left foot 05/14/2020   Pressure ulcer of right foot 05/14/2020   Pain in left foot 05/08/2020   Pain in right foot 05/08/2020   Bilateral impacted cerumen 03/10/2020   Bilateral sensorineural  hearing loss 03/10/2020   GAD (generalized anxiety disorder) 12/23/2018   At high risk for falls 09/03/2018   Mood disorder ([place]) 08/18/2015   OSA (obstructive sleep apnea) 01/19/2015   Vitamin D deficiency 01/07/2013   Need for prophylactic vaccination and inoculation against influenza 11/14/2011   Hypertension 11/14/2011   Osteoarthritis 11/20/2009   PSYCHIATRIC DISORDER 07/16/2008   History of pneumonia 07/16/2008   History of atrial fibrillation    HYPERCHOLESTEROLEMIA     REFERRING DIAG:  M19.90 (ICD-10-CM) - Osteoarthritis, unspecified osteoarthritis type, unspecified site    THERAPY DIAG:  Muscle weakness (generalized)  Other abnormalities of gait and mobility  Balance problem  Stiffness of right hip, not elsewhere classified  Rationale for Evaluation and Treatment Rehabilitation  PERTINENT HISTORY: none  PRECAUTIONS: fall  SUBJECTIVE: Patient states he had no soreness after last session.  States he is doing ok.    PAIN:  Are you having pain? No   OBJECTIVE: (objective measures completed at initial evaluation unless otherwise dated)  OBJECTIVE:    DIAGNOSTIC FINDINGS: 03/11/2022 Hip CT Scan:   IMPRESSION: 1. Acute minimally impacted right subcapital femoral neck fracture. 2. Mild-to-moderate right hip osteoarthritis.   PATIENT SURVEYS:  07/04/2022: FOTO 58% (projected 69% by visit 13)   COGNITION:           Overall cognitive status: Impaired Pt reports  that memory is not as good as has been.            SENSATION: Reports some tingling and impaired sensation in his lower legs   MUSCLE LENGTH: Tightness in hamstrings and tight hip flexors   POSTURE: rounded shoulders and forward head   PALPATION: Pt with some tenderness to palpation along right hip   LOWER EXTREMITY MMT: 07/04/2022: R hip strength grossly 4 to 4+/5, L hip strength of 5/5   FUNCTIONAL TESTS:  07/04/2022: 5 times sit to stand: 18.3 sec with pushing up on handrails Timed up and go  (TUG): 12.4 without assistive device   GAIT: Distance walked: 100 ft Assistive device utilized: None Level of assistance: SBA Comments: Unsteady gait pattern noted       TODAY'S TREATMENT:  07/13/2022: NuStep x 5 min level 5 Rockerboard x 2 min Standing hip extension and abduction with 5 lbs x 20 both Sit to stand without UE support (1 x 10 with no pad, 1 x 10 with pad in chair) Lateral band walks at barre x 5 laps with blue loop Stair training x 3 ascend and descend   07/04/2022:   Seated: heel/toe raises, marching, LAQ Sit to/from stand x5 reps     PATIENT EDUCATION:  Education details: Pt issued HEP Person educated: Patient and Spouse Education method: Explanation, Demonstration, and Handouts Education comprehension: verbalized understanding and returned demonstration     HOME EXERCISE PROGRAM: Access Code: [email or web address] URL: [email or web address] Date: 07/04/2022 Prepared by: Juel Burrow   Exercises - Seated Heel Toe Raises  - 1 x daily - 7 x weekly - 2 sets - 10 reps - Seated March  - 1 x daily - 7 x weekly - 2 sets - 10 reps - Seated Long Arc Quad  - 1 x daily - 7 x weekly - 2 sets - 10 reps - Seated Hamstring Stretch  - 1 x daily - 7 x weekly - 1 sets - 2 reps - 20 sec hold - Sit to Stand with Armchair  - 1 x daily - 7 x weekly - 2 sets - 5 reps   ASSESSMENT:   CLINICAL IMPRESSION: Patient did very well with all tasks.  He had no loss of balance on any tasks today.  He needed very little verbal cues for technique and was able to follow instruction with min repetition.   Pt would benefit from skilled outpatient PT to address his functional impairments to allow him to return to his prior independent lifestyle and decrease his further risk of falling.     OBJECTIVE IMPAIRMENTS decreased balance, difficulty walking, decreased strength, decreased safety awareness, impaired flexibility, and pain.    ACTIVITY LIMITATIONS squatting, stairs, and locomotion  level   PARTICIPATION LIMITATIONS: community activity and yard work   PERSONAL FACTORS Age, Time since onset of injury/illness/exacerbation, and 1 comorbidity: OA  are also affecting patient's functional outcome.    REHAB POTENTIAL: Good   CLINICAL DECISION MAKING: Evolving/moderate complexity   EVALUATION COMPLEXITY: Moderate     GOALS: Goals reviewed with patient? Yes   SHORT TERM GOALS: Target date: 07/25/2022  Pt and spouse will be independent with initial HEP. Baseline: Goal status: INITIAL   2.  Pt will report at least a 30% improvements in symptoms since starting PT. Baseline:  Goal status: INITIAL     LONG TERM GOALS: Target date: 08/29/2022    Pt and spouse will be independent with advanced HEP. Baseline:  Goal status: INITIAL   2.  Pt will increase FOTO score to at least 69% to demonstrate improvements in functional mobility. Baseline: 58% Goal status: INITIAL   3.  Pt will increase right hip strength to at least 5-/5 to allow him to navigate steps with reciprocal pattern. Baseline:  Goal status: INITIAL   4.  Pt will be able to perform 5 times sit to/from stand without use of UE to demonstrate improved functional strength. Baseline:  Goal status: INITIAL   5.  Pt will report pain no greater than 2/10 during errands and other tasks in the community. Baseline:  Goal status: INITIAL     PLAN: PT FREQUENCY: 2x/week   PT DURATION: 8 weeks   PLANNED INTERVENTIONS: Therapeutic exercises, Therapeutic activity, Neuromuscular re-education, Balance training, Gait training, Patient/Family education, Joint manipulation, Joint mobilization, Stair training, Aquatic Therapy, Dry Needling, Electrical stimulation, Cryotherapy, Moist heat, Taping, Ultrasound, Ionotophoresis '4mg'$ /ml Dexamethasone, Manual therapy, and Re-evaluation   PLAN FOR NEXT SESSION: assess and progress HEP as indicated, strengthening, flexibility     Keyly Baldonado B. Raveen Wieseler, PT 07/13/22 5:37 PM   [place] [address], [place], [place] [address] Phone # [phone] Fax [phone]

## 2022-07-18 ENCOUNTER — Encounter: Payer: Self-pay | Admitting: Rehabilitative and Restorative Service Providers"

## 2022-07-20 ENCOUNTER — Ambulatory Visit: Payer: Medicare Other

## 2022-07-25 ENCOUNTER — Ambulatory Visit: Payer: Medicare Other | Admitting: Rehabilitative and Restorative Service Providers"

## 2022-07-25 ENCOUNTER — Encounter: Payer: Self-pay | Admitting: Rehabilitative and Restorative Service Providers"

## 2022-07-25 DIAGNOSIS — R2689 Other abnormalities of gait and mobility: Secondary | ICD-10-CM

## 2022-07-25 DIAGNOSIS — M6281 Muscle weakness (generalized): Secondary | ICD-10-CM | POA: Diagnosis not present

## 2022-07-25 DIAGNOSIS — M1990 Unspecified osteoarthritis, unspecified site: Secondary | ICD-10-CM | POA: Diagnosis not present

## 2022-07-25 NOTE — Therapy (Signed)
OUTPATIENT PHYSICAL THERAPY TREATMENT NOTE   Patient Name: Jason [name] MRN: [id number] DOB:01/09/1941, 81 y.o., male Today's Date: 07/25/2022  PCP: [name], MD REFERRING PROVIDER: [name], MD  END OF SESSION:   PT End of Session - 07/25/22 0941     Visit Number 3    Date for PT Re-Evaluation 08/26/22    Authorization Type Medicare A    Progress Note Due on Visit 10    PT Start Time 0935    PT Stop Time 1015    PT Time Calculation (min) 40 min    Activity Tolerance Patient tolerated treatment well    Behavior During Therapy WFL for tasks assessed/performed             Past Medical History:  Diagnosis Date   Anxiety    Atrial fibrillation ([place])    past hx   Colon polyp    Depression    GERD (gastroesophageal reflux disease)    Gout, unspecified    Hypertension    Osteoarthrosis, unspecified whether generalized or localized, unspecified site    Pneumonia, organism unspecified(486)    Pure hypercholesterolemia    Routine general medical examination at a health care facility    Sleep apnea    not using cpap currently   Unspecified nonpsychotic mental disorder    Past Surgical History:  Procedure Laterality Date   COLONOSCOPY  10/04/2006   jacobs   TOTAL HIP ARTHROPLASTY Right 03/12/2022   Procedure: TOTAL HIP ARTHROPLASTY ANTERIOR APPROACH;  Surgeon: Rod Can, MD;  Location: [facility];  Service: Orthopedics;  Laterality: Right;   UPPER GASTROINTESTINAL ENDOSCOPY  2015   WISDOM TOOTH EXTRACTION     Patient Active Problem List   Diagnosis Date Noted   Acute blood loss as cause of postoperative anemia 03/13/2022   Hip fracture ([place]) 03/12/2022   Accidental fall 03/12/2022   Cognitive impairment 03/12/2022   Personal history of colonic polyps 04/21/2021   Pressure ulcer of left foot 05/14/2020   Pressure ulcer of right foot 05/14/2020   Pain in left foot 05/08/2020   Pain in right foot 05/08/2020   Bilateral impacted cerumen  03/10/2020   Bilateral sensorineural hearing loss 03/10/2020   GAD (generalized anxiety disorder) 12/23/2018   At high risk for falls 09/03/2018   Mood disorder ([place]) 08/18/2015   OSA (obstructive sleep apnea) 01/19/2015   Vitamin D deficiency 01/07/2013   Need for prophylactic vaccination and inoculation against influenza 11/14/2011   Hypertension 11/14/2011   Osteoarthritis 11/20/2009   PSYCHIATRIC DISORDER 07/16/2008   History of pneumonia 07/16/2008   History of atrial fibrillation    HYPERCHOLESTEROLEMIA     REFERRING DIAG:  M19.90 (ICD-10-CM) - Osteoarthritis, unspecified osteoarthritis type, unspecified site    THERAPY DIAG:  Muscle weakness (generalized)  Other abnormalities of gait and mobility  Balance problem  Rationale for Evaluation and Treatment Rehabilitation  PERTINENT HISTORY: none  PRECAUTIONS: fall  SUBJECTIVE: Patient states he had some R knee soreness following last visit and had some difficulty walking.  He reports that he is feeling better now without pain.  PAIN:  Are you having pain? No   OBJECTIVE: (objective measures completed at initial evaluation unless otherwise dated)  OBJECTIVE:    DIAGNOSTIC FINDINGS: 03/11/2022 Hip CT Scan:   IMPRESSION: 1. Acute minimally impacted right subcapital femoral neck fracture. 2. Mild-to-moderate right hip osteoarthritis.   PATIENT SURVEYS:  07/04/2022: FOTO 58% (projected 69% by visit 13)   COGNITION:  Overall cognitive status: Impaired Pt reports that memory is not as good as has been.            SENSATION: Reports some tingling and impaired sensation in his lower legs   MUSCLE LENGTH: Tightness in hamstrings and tight hip flexors   POSTURE: rounded shoulders and forward head   PALPATION: Pt with some tenderness to palpation along right hip   LOWER EXTREMITY MMT: 07/04/2022: R hip strength grossly 4 to 4+/5, L hip strength of 5/5   FUNCTIONAL TESTS:  07/04/2022: 5 times sit to  stand: 18.3 sec with pushing up on handrails Timed up and go (TUG): 12.4 without assistive device   GAIT: Distance walked: 100 ft Assistive device utilized: None Level of assistance: SBA Comments: Unsteady gait pattern noted       TODAY'S TREATMENT:  07/25/2022: Nustep level 4 (blue machine) x6 min with PT present to discuss status Seated with 2.5# ankle weights:  heel/toe raises, marching, LAQ, hip abduction scissors.  BLE 2x10 each Sit to/from stand seated on one pad:  2x10 with use of hands pressing up from thighs and legs against PT mat Standing at counter:  heel raises, hip abduction, hip extension.  BLE 2x10 each Rockerboard x 2 min Tandem gait holding onto barre x3 laps down and back Leg press (seat at 7) 80# 2x10  07/13/2022: NuStep x 5 min level 5 Rockerboard x 2 min Standing hip extension and abduction with 5 lbs x 20 both Sit to stand without UE support (1 x 10 with no pad, 1 x 10 with pad in chair) Lateral band walks at barre x 5 laps with blue loop Stair training x 3 ascend and descend   07/04/2022:   Seated: heel/toe raises, marching, LAQ Sit to/from stand x5 reps     PATIENT EDUCATION:  Education details: Pt issued HEP Person educated: Patient and Spouse Education method: Explanation, Demonstration, and Handouts Education comprehension: verbalized understanding and returned demonstration     HOME EXERCISE PROGRAM: Access Code: [email or web address] URL: [email or web address] Date: 07/04/2022 Prepared by: Shelby Dubin Moana [place]   Exercises - Seated Heel Toe Raises  - 1 x daily - 7 x weekly - 2 sets - 10 reps - Seated March  - 1 x daily - 7 x weekly - 2 sets - 10 reps - Seated Long Arc Quad  - 1 x daily - 7 x weekly - 2 sets - 10 reps - Seated Hamstring Stretch  - 1 x daily - 7 x weekly - 1 sets - 2 reps - 20 sec hold - Sit to Stand with Armchair  - 1 x daily - 7 x weekly - 2 sets - 5 reps   ASSESSMENT:   CLINICAL IMPRESSION: Mr Genter presents to skilled  rehabilitation with reports of some pain/soreness following skilled rehab visit last time.  Pt reports that he can tell that his walking is getting stronger, he feels at least 30% better since starting skilled PT. Decreased intensity of some exercises and pt without reports of pain during session.  Pt requires cuing for safety throughout session.  Pt with some difficulty with tandem stance and required unilateral UE support during.  Pt continues to require skilled PT to progress towards goal related activities.     OBJECTIVE IMPAIRMENTS decreased balance, difficulty walking, decreased strength, decreased safety awareness, impaired flexibility, and pain.    ACTIVITY LIMITATIONS squatting, stairs, and locomotion level   PARTICIPATION LIMITATIONS: community activity and yard work   PERSONAL FACTORS Age, Time since  onset of injury/illness/exacerbation, and 1 comorbidity: OA  are also affecting patient's functional outcome.    REHAB POTENTIAL: Good   CLINICAL DECISION MAKING: Evolving/moderate complexity   EVALUATION COMPLEXITY: Moderate     GOALS: Goals reviewed with patient? Yes   SHORT TERM GOALS: Target date: 07/25/2022  Pt and spouse will be independent with initial HEP. Baseline: Goal status: Goal Met   2.  Pt will report at least a 30% improvements in symptoms since starting PT. Baseline:  Goal status: Goal Met 07/25/2022     LONG TERM GOALS: Target date: 08/29/2022    Pt and spouse will be independent with advanced HEP. Baseline:  Goal status: INITIAL   2.  Pt will increase FOTO score to at least 69% to demonstrate improvements in functional mobility. Baseline: 58% Goal status: INITIAL   3.  Pt will increase right hip strength to at least 5-/5 to allow him to navigate steps with reciprocal pattern. Baseline:  Goal status: INITIAL   4.  Pt will be able to perform 5 times sit to/from stand without use of UE to demonstrate improved functional strength. Baseline:  Goal  status: INITIAL   5.  Pt will report pain no greater than 2/10 during errands and other tasks in the community. Baseline:  Goal status: INITIAL     PLAN: PT FREQUENCY: 2x/week   PT DURATION: 8 weeks   PLANNED INTERVENTIONS: Therapeutic exercises, Therapeutic activity, Neuromuscular re-education, Balance training, Gait training, Patient/Family education, Joint manipulation, Joint mobilization, Stair training, Aquatic Therapy, Dry Needling, Electrical stimulation, Cryotherapy, Moist heat, Taping, Ultrasound, Ionotophoresis 4mg /ml Dexamethasone, Manual therapy, and Re-evaluation   PLAN FOR NEXT SESSION: assess and progress HEP as indicated, strengthening, flexibility     Juel Burrow, PT 07/25/22 10:22 AM   [facility] [address], [place], [place] [address] Phone # [phone] Fax [phone]

## 2022-07-27 ENCOUNTER — Encounter: Payer: Self-pay | Admitting: Rehabilitative and Restorative Service Providers"

## 2022-07-27 ENCOUNTER — Ambulatory Visit: Payer: Medicare Other | Attending: Family Medicine | Admitting: Rehabilitative and Restorative Service Providers"

## 2022-07-27 DIAGNOSIS — M6281 Muscle weakness (generalized): Secondary | ICD-10-CM | POA: Insufficient documentation

## 2022-07-27 DIAGNOSIS — M25651 Stiffness of right hip, not elsewhere classified: Secondary | ICD-10-CM | POA: Insufficient documentation

## 2022-07-27 DIAGNOSIS — R2689 Other abnormalities of gait and mobility: Secondary | ICD-10-CM | POA: Diagnosis not present

## 2022-07-27 NOTE — Therapy (Signed)
OUTPATIENT PHYSICAL THERAPY TREATMENT NOTE   Patient Name: Jason [name] MRN: [id number] DOB:1941/01/11, 81 y.o., male Today's Date: 07/27/2022  PCP: [name], MD REFERRING PROVIDER: [name], MD  END OF SESSION:   PT End of Session - 07/27/22 1018     Visit Number 4    Date for PT Re-Evaluation 08/26/22    Authorization Type Medicare A    Progress Note Due on Visit 10    PT Start Time 6754    PT Stop Time 1055    PT Time Calculation (min) 40 min    Activity Tolerance Patient tolerated treatment well    Behavior During Therapy WFL for tasks assessed/performed             Past Medical History:  Diagnosis Date   Anxiety    Atrial fibrillation ([place])    past hx   Colon polyp    Depression    GERD (gastroesophageal reflux disease)    Gout, unspecified    Hypertension    Osteoarthrosis, unspecified whether generalized or localized, unspecified site    Pneumonia, organism unspecified(486)    Pure hypercholesterolemia    Routine general medical examination at a health care facility    Sleep apnea    not using cpap currently   Unspecified nonpsychotic mental disorder    Past Surgical History:  Procedure Laterality Date   COLONOSCOPY  10/04/2006   jacobs   TOTAL HIP ARTHROPLASTY Right 03/12/2022   Procedure: TOTAL HIP ARTHROPLASTY ANTERIOR APPROACH;  Surgeon: Rod Can, MD;  Location: [facility];  Service: Orthopedics;  Laterality: Right;   UPPER GASTROINTESTINAL ENDOSCOPY  2015   WISDOM TOOTH EXTRACTION     Patient Active Problem List   Diagnosis Date Noted   Acute blood loss as cause of postoperative anemia 03/13/2022   Hip fracture ([place]) 03/12/2022   Accidental fall 03/12/2022   Cognitive impairment 03/12/2022   Personal history of colonic polyps 04/21/2021   Pressure ulcer of left foot 05/14/2020   Pressure ulcer of right foot 05/14/2020   Pain in left foot 05/08/2020   Pain in right foot 05/08/2020   Bilateral impacted cerumen  03/10/2020   Bilateral sensorineural hearing loss 03/10/2020   GAD (generalized anxiety disorder) 12/23/2018   At high risk for falls 09/03/2018   Mood disorder ([place]) 08/18/2015   OSA (obstructive sleep apnea) 01/19/2015   Vitamin D deficiency 01/07/2013   Need for prophylactic vaccination and inoculation against influenza 11/14/2011   Hypertension 11/14/2011   Osteoarthritis 11/20/2009   PSYCHIATRIC DISORDER 07/16/2008   History of pneumonia 07/16/2008   History of atrial fibrillation    HYPERCHOLESTEROLEMIA     REFERRING DIAG:  M19.90 (ICD-10-CM) - Osteoarthritis, unspecified osteoarthritis type, unspecified site    THERAPY DIAG:  Muscle weakness (generalized)  Other abnormalities of gait and mobility  Balance problem  Rationale for Evaluation and Treatment Rehabilitation  PERTINENT HISTORY: none  PRECAUTIONS: fall  SUBJECTIVE: Patient denies any pain today.  PAIN:  Are you having pain? No   OBJECTIVE: (objective measures completed at initial evaluation unless otherwise dated)  OBJECTIVE:    DIAGNOSTIC FINDINGS: 03/11/2022 Hip CT Scan:   IMPRESSION: 1. Acute minimally impacted right subcapital femoral neck fracture. 2. Mild-to-moderate right hip osteoarthritis.   PATIENT SURVEYS:  07/04/2022: FOTO 58% (projected 69% by visit 13)   COGNITION:           Overall cognitive status: Impaired Pt reports that memory is not as good as has been.  SENSATION: Reports some tingling and impaired sensation in his lower legs   MUSCLE LENGTH: Tightness in hamstrings and tight hip flexors   POSTURE: rounded shoulders and forward head   PALPATION: Pt with some tenderness to palpation along right hip   LOWER EXTREMITY MMT: 07/04/2022: R hip strength grossly 4 to 4+/5, L hip strength of 5/5   FUNCTIONAL TESTS:  07/04/2022: 5 times sit to stand: 18.3 sec with pushing up on handrails Timed up and go (TUG): 12.4 without assistive device   GAIT: Distance  walked: 100 ft Assistive device utilized: None Level of assistance: SBA Comments: Unsteady gait pattern noted       TODAY'S TREATMENT:  07/27/2022: Nustep level 4 (blue machine) x6 min with PT present to discuss status Seated with 3# ankle weights:  heel/toe raises, marching, LAQ, hip abduction scissors.  BLE 2x10 each Sit to/from stand seated on one pad:  2x10 without UE use Leg press (seat at 7) 90# 2x10 Hip Matrix 40# hip abduction and hip extension 2x10 bilat FWD step ups with 6 inch steps with UE support on handrails 2x10 bilat  07/25/2022: Nustep level 4 (blue machine) x6 min with PT present to discuss status Seated with 2.5# ankle weights:  heel/toe raises, marching, LAQ, hip abduction scissors.  BLE 2x10 each Sit to/from stand seated on one pad:  2x10 with use of hands pressing up from thighs and legs against PT mat Standing at counter:  heel raises, hip abduction, hip extension.  BLE 2x10 each Rockerboard x 2 min Tandem gait holding onto barre x3 laps down and back Leg press (seat at 7) 80# 2x10  07/13/2022: NuStep x 5 min level 5 Rockerboard x 2 min Standing hip extension and abduction with 5 lbs x 20 both Sit to stand without UE support (1 x 10 with no pad, 1 x 10 with pad in chair) Lateral band walks at barre x 5 laps with blue loop Stair training x 3 ascend and descend     PATIENT EDUCATION:  Education details: Pt issued HEP Person educated: Patient and Spouse Education method: Explanation, Demonstration, and Handouts Education comprehension: verbalized understanding and returned demonstration     HOME EXERCISE PROGRAM: Access Code: [email or web address] URL: [email or web address] Date: 07/27/2022 Prepared by: Juel Burrow  Exercises - Seated Heel Toe Raises  - 1 x daily - 7 x weekly - 2 sets - 10 reps - Seated March  - 1 x daily - 7 x weekly - 2 sets - 10 reps - Seated Long Arc Quad  - 1 x daily - 7 x weekly - 2 sets - 10 reps - Seated Hamstring Stretch   - 1 x daily - 7 x weekly - 1 sets - 2 reps - 20 sec hold - Sit to Stand with Armchair  - 1 x daily - 7 x weekly - 2 sets - 5 reps - Standing Hip Abduction with Counter Support  - 1 x daily - 7 x weekly - 2 sets - 10 reps - Standing Hip Extension with Counter Support  - 1 x daily - 7 x weekly - 2 sets - 10 reps - Standing March with Counter Support  - 1 x daily - 7 x weekly - 2 sets - 10 reps   ASSESSMENT:   CLINICAL IMPRESSION: Mr Madole presents to skilled rehabilitation reporting no pain.  Pt continues to progress towards goal related activities and was able to increase strengthening today. Pt continues to require cuing for technique and pacing throughout.  OBJECTIVE IMPAIRMENTS decreased balance, difficulty walking, decreased strength, decreased safety awareness, impaired flexibility, and pain.    ACTIVITY LIMITATIONS squatting, stairs, and locomotion level   PARTICIPATION LIMITATIONS: community activity and yard work   PERSONAL FACTORS Age, Time since onset of injury/illness/exacerbation, and 1 comorbidity: OA  are also affecting patient's functional outcome.    REHAB POTENTIAL: Good   CLINICAL DECISION MAKING: Evolving/moderate complexity   EVALUATION COMPLEXITY: Moderate     GOALS: Goals reviewed with patient? Yes   SHORT TERM GOALS: Target date: 07/25/2022  Pt and spouse will be independent with initial HEP. Baseline: Goal status: Goal Met   2.  Pt will report at least a 30% improvements in symptoms since starting PT. Baseline:  Goal status: Goal Met 07/25/2022     LONG TERM GOALS: Target date: 08/29/2022    Pt and spouse will be independent with advanced HEP. Baseline:  Goal status: Ongoing   2.  Pt will increase FOTO score to at least 69% to demonstrate improvements in functional mobility. Baseline: 58% Goal status: INITIAL   3.  Pt will increase right hip strength to at least 5-/5 to allow him to navigate steps with reciprocal pattern. Baseline:  Goal  status: INITIAL   4.  Pt will be able to perform 5 times sit to/from stand without use of UE to demonstrate improved functional strength. Baseline:  Goal status: INITIAL   5.  Pt will report pain no greater than 2/10 during errands and other tasks in the community. Baseline:  Goal status: INITIAL     PLAN: PT FREQUENCY: 2x/week   PT DURATION: 8 weeks   PLANNED INTERVENTIONS: Therapeutic exercises, Therapeutic activity, Neuromuscular re-education, Balance training, Gait training, Patient/Family education, Joint manipulation, Joint mobilization, Stair training, Aquatic Therapy, Dry Needling, Electrical stimulation, Cryotherapy, Moist heat, Taping, Ultrasound, Ionotophoresis 34m/ml Dexamethasone, Manual therapy, and Re-evaluation   PLAN FOR NEXT SESSION: assess and progress HEP as indicated, strengthening, flexibility     SJuel Burrow PT 07/27/22 10:59 AM   [facility] [address] [place] [address]Phone # [phone] [phone]

## 2022-08-01 ENCOUNTER — Ambulatory Visit: Payer: Medicare Other | Admitting: Physical Therapy

## 2022-08-01 ENCOUNTER — Encounter: Payer: Self-pay | Admitting: Physical Therapy

## 2022-08-01 DIAGNOSIS — R2689 Other abnormalities of gait and mobility: Secondary | ICD-10-CM

## 2022-08-01 DIAGNOSIS — M6281 Muscle weakness (generalized): Secondary | ICD-10-CM | POA: Diagnosis not present

## 2022-08-01 DIAGNOSIS — M25651 Stiffness of right hip, not elsewhere classified: Secondary | ICD-10-CM | POA: Diagnosis not present

## 2022-08-01 NOTE — Therapy (Signed)
OUTPATIENT PHYSICAL THERAPY TREATMENT NOTE   Patient Name: Jason [name] MRN: [id number] DOB:Oct 12, 1941, 81 y.o., male Today's Date: 08/01/2022  PCP: [name], MD REFERRING PROVIDER: [name], MD  END OF SESSION:   PT End of Session - 08/01/22 1049     Visit Number 5    Date for PT Re-Evaluation 08/26/22    Authorization Type Medicare A    Progress Note Due on Visit 10    PT Start Time 1016    PT Stop Time 1057    PT Time Calculation (min) 41 min    Activity Tolerance Patient tolerated treatment well    Behavior During Therapy WFL for tasks assessed/performed              Past Medical History:  Diagnosis Date   Anxiety    Atrial fibrillation ([place])    past hx   Colon polyp    Depression    GERD (gastroesophageal reflux disease)    Gout, unspecified    Hypertension    Osteoarthrosis, unspecified whether generalized or localized, unspecified site    Pneumonia, organism unspecified(486)    Pure hypercholesterolemia    Routine general medical examination at a health care facility    Sleep apnea    not using cpap currently   Unspecified nonpsychotic mental disorder    Past Surgical History:  Procedure Laterality Date   COLONOSCOPY  10/04/2006   jacobs   TOTAL HIP ARTHROPLASTY Right 03/12/2022   Procedure: TOTAL HIP ARTHROPLASTY ANTERIOR APPROACH;  Surgeon: Rod Can, MD;  Location: [facility];  Service: Orthopedics;  Laterality: Right;   UPPER GASTROINTESTINAL ENDOSCOPY  2015   WISDOM TOOTH EXTRACTION     Patient Active Problem List   Diagnosis Date Noted   Acute blood loss as cause of postoperative anemia 03/13/2022   Hip fracture ([place]) 03/12/2022   Accidental fall 03/12/2022   Cognitive impairment 03/12/2022   Personal history of colonic polyps 04/21/2021   Pressure ulcer of left foot 05/14/2020   Pressure ulcer of right foot 05/14/2020   Pain in left foot 05/08/2020   Pain in right foot 05/08/2020   Bilateral impacted cerumen  03/10/2020   Bilateral sensorineural hearing loss 03/10/2020   GAD (generalized anxiety disorder) 12/23/2018   At high risk for falls 09/03/2018   Mood disorder ([place]) 08/18/2015   OSA (obstructive sleep apnea) 01/19/2015   Vitamin D deficiency 01/07/2013   Need for prophylactic vaccination and inoculation against influenza 11/14/2011   Hypertension 11/14/2011   Osteoarthritis 11/20/2009   PSYCHIATRIC DISORDER 07/16/2008   History of pneumonia 07/16/2008   History of atrial fibrillation    HYPERCHOLESTEROLEMIA     REFERRING DIAG:  M19.90 (ICD-10-CM) - Osteoarthritis, unspecified osteoarthritis type, unspecified site    THERAPY DIAG:  Muscle weakness (generalized)  Other abnormalities of gait and mobility  Balance problem  Stiffness of right hip, not elsewhere classified  Rationale for Evaluation and Treatment Rehabilitation  PERTINENT HISTORY: none  PRECAUTIONS: fall  SUBJECTIVE: I was wore out but ok after last session.   PAIN:  Are you having pain? No   OBJECTIVE: (objective measures completed at initial evaluation unless otherwise dated)  OBJECTIVE:    DIAGNOSTIC FINDINGS: 03/11/2022 Hip CT Scan:   IMPRESSION: 1. Acute minimally impacted right subcapital femoral neck fracture. 2. Mild-to-moderate right hip osteoarthritis.   PATIENT SURVEYS:  07/04/2022: FOTO 58% (projected 69% by visit 13)   COGNITION:           Overall cognitive status:  Impaired Pt reports that memory is not as good as has been.            SENSATION: Reports some tingling and impaired sensation in his lower legs   MUSCLE LENGTH: Tightness in hamstrings and tight hip flexors   POSTURE: rounded shoulders and forward head   PALPATION: Pt with some tenderness to palpation along right hip   LOWER EXTREMITY MMT: 07/04/2022: R hip strength grossly 4 to 4+/5, L hip strength of 5/5   FUNCTIONAL TESTS:  07/04/2022: 5 times sit to stand: 18.3 sec with pushing up on handrails Timed up and  go (TUG): 12.4 without assistive device   GAIT: Distance walked: 100 ft Assistive device utilized: None Level of assistance: SBA Comments: Unsteady gait pattern noted       TODAY'S TREATMENT:   08/01/22: Nustep level 4 (blue machine) x8 min with PTA present to discuss status Seated with ankle weights:  heel/toe raises 4#, marching, LAQ 4# (10x),  BLE 2x10 each Standing hip flexion: 2x10  Bil 4# Sit to/from stand seated on one pad:  2x10 without UE use Leg press (seat at 7) 95# 2x10: Vc to control TKE better Green loop at ankle: side stepping at [facility] 4 lengths with UE support FWD step ups with 6 inch steps with UE support on handrails 2x10 bilat  07/27/2022: Nustep level 4 (blue machine) x6 min with PT present to discuss status Seated with 3# ankle weights:  heel/toe raises, marching, LAQ, hip abduction scissors.  BLE 2x10 each Sit to/from stand seated on one pad:  2x10 without UE use Leg press (seat at 7) 90# 2x10 Hip Matrix 40# hip abduction and hip extension 2x10 bilat FWD step ups with 6 inch steps with UE support on handrails 2x10 bilat  07/25/2022: Nustep level 4 (blue machine) x6 min with PT present to discuss status Seated with 2.5# ankle weights:  heel/toe raises, marching, LAQ, hip abduction scissors.  BLE 2x10 each Sit to/from stand seated on one pad:  2x10 with use of hands pressing up from thighs and legs against PT mat Standing at counter:  heel raises, hip abduction, hip extension.  BLE 2x10 each Rockerboard x 2 min Tandem gait holding onto barre x3 laps down and back Leg press (seat at 7) 80# 2x10     PATIENT EDUCATION:  Education details: Pt issued HEP Person educated: Patient and Spouse Education method: Explanation, Demonstration, and Handouts Education comprehension: verbalized understanding and returned demonstration     HOME EXERCISE PROGRAM: Access Code: [email or web address] URL: [email or web address] Date: 07/27/2022 Prepared by: Juel Burrow  Exercises - Seated Heel Toe Raises  - 1 x daily - 7 x weekly - 2 sets - 10 reps - Seated March  - 1 x daily - 7 x weekly - 2 sets - 10 reps - Seated Long Arc Quad  - 1 x daily - 7 x weekly - 2 sets - 10 reps - Seated Hamstring Stretch  - 1 x daily - 7 x weekly - 1 sets - 2 reps - 20 sec hold - Sit to Stand with Armchair  - 1 x daily - 7 x weekly - 2 sets - 5 reps - Standing Hip Abduction with Counter Support  - 1 x daily - 7 x weekly - 2 sets - 10 reps - Standing Hip Extension with Counter Support  - 1 x daily - 7 x weekly - 2 sets - 10 reps - Standing March with Counter Support  - 1 x  daily - 7 x weekly - 2 sets - 10 reps   ASSESSMENT:   CLINICAL IMPRESSION: Pt arrives with no pain. He verbally reports all use of his ankles and hips have improved. Pt's wife just joined [employer] and he is thinking of going there for exercise. He tolerated increasing his LE resistance well today. Pt demonstrates weakness on th eleg press in controlling TKE.      OBJECTIVE IMPAIRMENTS decreased balance, difficulty walking, decreased strength, decreased safety awareness, impaired flexibility, and pain.    ACTIVITY LIMITATIONS squatting, stairs, and locomotion level   PARTICIPATION LIMITATIONS: community activity and yard work   PERSONAL FACTORS Age, Time since onset of injury/illness/exacerbation, and 1 comorbidity: OA  are also affecting patient's functional outcome.    REHAB POTENTIAL: Good   CLINICAL DECISION MAKING: Evolving/moderate complexity   EVALUATION COMPLEXITY: Moderate     GOALS: Goals reviewed with patient? Yes   SHORT TERM GOALS: Target date: 07/25/2022  Pt and spouse will be independent with initial HEP. Baseline: Goal status: Goal Met   2.  Pt will report at least a 30% improvements in symptoms since starting PT. Baseline:  Goal status: Goal Met 07/25/2022     LONG TERM GOALS: Target date: 08/29/2022    Pt and spouse will be independent with advanced HEP. Baseline:   Goal status: Ongoing   2.  Pt will increase FOTO score to at least 69% to demonstrate improvements in functional mobility. Baseline: 58% Goal status: INITIAL   3.  Pt will increase right hip strength to at least 5-/5 to allow him to navigate steps with reciprocal pattern. Baseline:  Goal status: INITIAL   4.  Pt will be able to perform 5 times sit to/from stand without use of UE to demonstrate improved functional strength. Baseline:  Goal status: INITIAL   5.  Pt will report pain no greater than 2/10 during errands and other tasks in the community. Baseline:  Goal status: INITIAL     PLAN: PT FREQUENCY: 2x/week   PT DURATION: 8 weeks   PLANNED INTERVENTIONS: Therapeutic exercises, Therapeutic activity, Neuromuscular re-education, Balance training, Gait training, Patient/Family education, Joint manipulation, Joint mobilization, Stair training, Aquatic Therapy, Dry Needling, Electrical stimulation, Cryotherapy, Moist heat, Taping, Ultrasound, Ionotophoresis 4mg /ml Dexamethasone, Manual therapy, and Re-evaluation   PLAN FOR NEXT SESSION: assess and progress HEP as indicated, strengthening, flexibility    Myrene Galas, PTA 08/01/22 10:55 AM   [facility] [address] [place], [place] [address] Phone # [phone] Fax [phone]

## 2022-08-03 ENCOUNTER — Ambulatory Visit: Payer: Medicare Other | Admitting: Rehabilitative and Restorative Service Providers"

## 2022-08-03 ENCOUNTER — Encounter: Payer: Self-pay | Admitting: Rehabilitative and Restorative Service Providers"

## 2022-08-03 DIAGNOSIS — M6281 Muscle weakness (generalized): Secondary | ICD-10-CM | POA: Diagnosis not present

## 2022-08-03 DIAGNOSIS — M25651 Stiffness of right hip, not elsewhere classified: Secondary | ICD-10-CM | POA: Diagnosis not present

## 2022-08-03 DIAGNOSIS — R2689 Other abnormalities of gait and mobility: Secondary | ICD-10-CM | POA: Diagnosis not present

## 2022-08-03 NOTE — Therapy (Signed)
OUTPATIENT PHYSICAL THERAPY TREATMENT NOTE   Patient Name: Jason [name] MRN: [id number] DOB:06-04-1941, 81 y.o., male Today's Date: 08/03/2022  PCP: [name], MD REFERRING PROVIDER: [name], MD  END OF SESSION:   PT End of Session - 08/03/22 1110     Visit Number 6    Date for PT Re-Evaluation 08/26/22    Authorization Type Medicare A    Progress Note Due on Visit 10    PT Start Time 1104    PT Stop Time 1145    PT Time Calculation (min) 41 min    Activity Tolerance Patient tolerated treatment well    Behavior During Therapy WFL for tasks assessed/performed              Past Medical History:  Diagnosis Date   Anxiety    Atrial fibrillation ([place])    past hx   Colon polyp    Depression    GERD (gastroesophageal reflux disease)    Gout, unspecified    Hypertension    Osteoarthrosis, unspecified whether generalized or localized, unspecified site    Pneumonia, organism unspecified(486)    Pure hypercholesterolemia    Routine general medical examination at a health care facility    Sleep apnea    not using cpap currently   Unspecified nonpsychotic mental disorder    Past Surgical History:  Procedure Laterality Date   COLONOSCOPY  10/04/2006   jacobs   TOTAL HIP ARTHROPLASTY Right 03/12/2022   Procedure: TOTAL HIP ARTHROPLASTY ANTERIOR APPROACH;  Surgeon: Rod Can, MD;  Location: [facility];  Service: Orthopedics;  Laterality: Right;   UPPER GASTROINTESTINAL ENDOSCOPY  2015   WISDOM TOOTH EXTRACTION     Patient Active Problem List   Diagnosis Date Noted   Acute blood loss as cause of postoperative anemia 03/13/2022   Hip fracture ([place]) 03/12/2022   Accidental fall 03/12/2022   Cognitive impairment 03/12/2022   Personal history of colonic polyps 04/21/2021   Pressure ulcer of left foot 05/14/2020   Pressure ulcer of right foot 05/14/2020   Pain in left foot 05/08/2020   Pain in right foot 05/08/2020   Bilateral impacted cerumen  03/10/2020   Bilateral sensorineural hearing loss 03/10/2020   GAD (generalized anxiety disorder) 12/23/2018   At high risk for falls 09/03/2018   Mood disorder ([place]) 08/18/2015   OSA (obstructive sleep apnea) 01/19/2015   Vitamin D deficiency 01/07/2013   Need for prophylactic vaccination and inoculation against influenza 11/14/2011   Hypertension 11/14/2011   Osteoarthritis 11/20/2009   PSYCHIATRIC DISORDER 07/16/2008   History of pneumonia 07/16/2008   History of atrial fibrillation    HYPERCHOLESTEROLEMIA     REFERRING DIAG:  M19.90 (ICD-10-CM) - Osteoarthritis, unspecified osteoarthritis type, unspecified site    THERAPY DIAG:  Muscle weakness (generalized)  Other abnormalities of gait and mobility  Balance problem  Rationale for Evaluation and Treatment Rehabilitation  PERTINENT HISTORY: none  PRECAUTIONS: fall  SUBJECTIVE: Pt reports that he had a little bit of soreness following last session, but was okay.  PAIN:  Are you having pain? No   OBJECTIVE: (objective measures completed at initial evaluation unless otherwise dated)  OBJECTIVE:    DIAGNOSTIC FINDINGS: 03/11/2022 Hip CT Scan:   IMPRESSION: 1. Acute minimally impacted right subcapital femoral neck fracture. 2. Mild-to-moderate right hip osteoarthritis.   PATIENT SURVEYS:  07/04/2022: FOTO 58% (projected 69% by visit 13)   COGNITION:           Overall cognitive status: Impaired Pt  reports that memory is not as good as has been.            SENSATION: Reports some tingling and impaired sensation in his lower legs   MUSCLE LENGTH: Tightness in hamstrings and tight hip flexors   POSTURE: rounded shoulders and forward head   PALPATION: Pt with some tenderness to palpation along right hip   LOWER EXTREMITY MMT: 07/04/2022: R hip strength grossly 4 to 4+/5, L hip strength of 5/5   FUNCTIONAL TESTS:  07/04/2022: 5 times sit to stand: 18.3 sec with pushing up on handrails Timed up and go (TUG):  12.4 without assistive device   GAIT: Distance walked: 100 ft Assistive device utilized: None Level of assistance: SBA Comments: Unsteady gait pattern noted       TODAY'S TREATMENT:   08/03/2022: Nustep level 5 (green machine) x8 min with PT present to discuss status Seated with 4# ankle weights:  heel/toe raises, marching, LAQ, hip abduction scissors.  BLE 2x10 each Sit to/from stand seated on one pad:  2x10 without UE use Leg press (seat at 7) 95# 2x10: Vc to control TKE better Standing at barre:  hip abduction and hip extension with green loop 2x10 bilat FWD step ups with 6 inch steps with UE support on handrails x10 bilat  08/01/22: Nustep level 4 (blue machine) x8 min with PTA present to discuss status Seated with ankle weights:  heel/toe raises 4#, marching, LAQ 4# (10x),  BLE 2x10 each Standing hip flexion: 2x10  Bil 4# Sit to/from stand seated on one pad:  2x10 without UE use Leg press (seat at 7) 95# 2x10: Vc to control TKE better Green loop at ankle: side stepping at [facility] 4 lengths with UE support FWD step ups with 6 inch steps with UE support on handrails 2x10 bilat  07/27/2022: Nustep level 4 (blue machine) x6 min with PT present to discuss status Seated with 3# ankle weights:  heel/toe raises, marching, LAQ, hip abduction scissors.  BLE 2x10 each Sit to/from stand seated on one pad:  2x10 without UE use Leg press (seat at 7) 90# 2x10 Hip Matrix 40# hip abduction and hip extension 2x10 bilat FWD step ups with 6 inch steps with UE support on handrails 2x10 bilat     PATIENT EDUCATION:  Education details: Pt issued HEP Person educated: Patient and Spouse Education method: Explanation, Demonstration, and Handouts Education comprehension: verbalized understanding and returned demonstration     HOME EXERCISE PROGRAM: Access Code: [email or web address] URL: [email or web address] Date: 07/27/2022 Prepared by: Juel Burrow  Exercises - Seated Heel Toe Raises  - 1  x daily - 7 x weekly - 2 sets - 10 reps - Seated March  - 1 x daily - 7 x weekly - 2 sets - 10 reps - Seated Long Arc Quad  - 1 x daily - 7 x weekly - 2 sets - 10 reps - Seated Hamstring Stretch  - 1 x daily - 7 x weekly - 1 sets - 2 reps - 20 sec hold - Sit to Stand with Armchair  - 1 x daily - 7 x weekly - 2 sets - 5 reps - Standing Hip Abduction with Counter Support  - 1 x daily - 7 x weekly - 2 sets - 10 reps - Standing Hip Extension with Counter Support  - 1 x daily - 7 x weekly - 2 sets - 10 reps - Standing March with Counter Support  - 1 x daily - 7 x weekly - 2  sets - 10 reps   ASSESSMENT:   CLINICAL IMPRESSION: Mr Buda presents to skilled rehabilitation with overall reports of improvements.  Pt is progressing with increased standing tolerance and strengthening exercises.  Pt continues to require cushion under him for sit to/from stand without UE.  Pt continues to require skilled PT to progress towards goal related activities.      OBJECTIVE IMPAIRMENTS decreased balance, difficulty walking, decreased strength, decreased safety awareness, impaired flexibility, and pain.    ACTIVITY LIMITATIONS squatting, stairs, and locomotion level   PARTICIPATION LIMITATIONS: community activity and yard work   PERSONAL FACTORS Age, Time since onset of injury/illness/exacerbation, and 1 comorbidity: OA  are also affecting patient's functional outcome.    REHAB POTENTIAL: Good   CLINICAL DECISION MAKING: Evolving/moderate complexity   EVALUATION COMPLEXITY: Moderate     GOALS: Goals reviewed with patient? Yes   SHORT TERM GOALS: Target date: 07/25/2022  Pt and spouse will be independent with initial HEP. Baseline: Goal status: Goal Met   2.  Pt will report at least a 30% improvements in symptoms since starting PT. Baseline:  Goal status: Goal Met 07/25/2022     LONG TERM GOALS: Target date: 08/29/2022    Pt and spouse will be independent with advanced HEP. Baseline:  Goal status:  Ongoing   2.  Pt will increase FOTO score to at least 69% to demonstrate improvements in functional mobility. Baseline: 58% Goal status: INITIAL   3.  Pt will increase right hip strength to at least 5-/5 to allow him to navigate steps with reciprocal pattern. Baseline:  Goal status: INITIAL   4.  Pt will be able to perform 5 times sit to/from stand without use of UE to demonstrate improved functional strength. Baseline:  Goal status: INITIAL   5.  Pt will report pain no greater than 2/10 during errands and other tasks in the community. Baseline:  Goal status: INITIAL     PLAN: PT FREQUENCY: 2x/week   PT DURATION: 8 weeks   PLANNED INTERVENTIONS: Therapeutic exercises, Therapeutic activity, Neuromuscular re-education, Balance training, Gait training, Patient/Family education, Joint manipulation, Joint mobilization, Stair training, Aquatic Therapy, Dry Needling, Electrical stimulation, Cryotherapy, Moist heat, Taping, Ultrasound, Ionotophoresis 4mg /ml Dexamethasone, Manual therapy, and Re-evaluation   PLAN FOR NEXT SESSION: assess and progress HEP as indicated, strengthening, flexibility    Juel Burrow, PT 08/03/22 12:39 PM  [facility] [address] [place], [place] [address] Phone # [phone] Fax [phone]

## 2022-08-09 ENCOUNTER — Ambulatory Visit: Payer: Medicare Other | Admitting: Rehabilitative and Restorative Service Providers"

## 2022-08-09 ENCOUNTER — Encounter: Payer: Self-pay | Admitting: Rehabilitative and Restorative Service Providers"

## 2022-08-09 DIAGNOSIS — M6281 Muscle weakness (generalized): Secondary | ICD-10-CM

## 2022-08-09 DIAGNOSIS — R2689 Other abnormalities of gait and mobility: Secondary | ICD-10-CM

## 2022-08-09 DIAGNOSIS — M25651 Stiffness of right hip, not elsewhere classified: Secondary | ICD-10-CM | POA: Diagnosis not present

## 2022-08-09 NOTE — Therapy (Signed)
OUTPATIENT PHYSICAL THERAPY TREATMENT NOTE   Patient Name: Jason [name] MRN: [id number] DOB:[birth year], 81 y.o., male Today's Date: 08/09/2022  PCP: [name], MD REFERRING PROVIDER: [name], MD  END OF SESSION:   PT End of Session - 08/09/22 1154     Visit Number 7    Date for PT Re-Evaluation 08/26/22    Authorization Type Medicare A    Progress Note Due on Visit 10    PT Start Time 1146    PT Stop Time 1225    PT Time Calculation (min) 39 min    Activity Tolerance Patient tolerated treatment well    Behavior During Therapy WFL for tasks assessed/performed              Past Medical History:  Diagnosis Date   Anxiety    Atrial fibrillation ([place])    past hx   Colon polyp    Depression    GERD (gastroesophageal reflux disease)    Gout, unspecified    Hypertension    Osteoarthrosis, unspecified whether generalized or localized, unspecified site    Pneumonia, organism unspecified(486)    Pure hypercholesterolemia    Routine general medical examination at a health care facility    Sleep apnea    not using cpap currently   Unspecified nonpsychotic mental disorder    Past Surgical History:  Procedure Laterality Date   COLONOSCOPY  10/04/2006   jacobs   TOTAL HIP ARTHROPLASTY Right 03/12/2022   Procedure: TOTAL HIP ARTHROPLASTY ANTERIOR APPROACH;  Surgeon: Rod Can, MD;  Location: [facility];  Service: Orthopedics;  Laterality: Right;   UPPER GASTROINTESTINAL ENDOSCOPY  2015   WISDOM TOOTH EXTRACTION     Patient Active Problem List   Diagnosis Date Noted   Acute blood loss as cause of postoperative anemia 03/13/2022   Hip fracture ([place]) 03/12/2022   Accidental fall 03/12/2022   Cognitive impairment 03/12/2022   Personal history of colonic polyps 04/21/2021   Pressure ulcer of left foot 05/14/2020   Pressure ulcer of right foot 05/14/2020   Pain in left foot 05/08/2020   Pain in right foot 05/08/2020   Bilateral impacted cerumen  03/10/2020   Bilateral sensorineural hearing loss 03/10/2020   GAD (generalized anxiety disorder) 12/23/2018   At high risk for falls 09/03/2018   Mood disorder ([place]) 08/18/2015   OSA (obstructive sleep apnea) 01/19/2015   Vitamin D deficiency 01/07/2013   Need for prophylactic vaccination and inoculation against influenza 11/14/2011   Hypertension 11/14/2011   Osteoarthritis 11/20/2009   PSYCHIATRIC DISORDER 07/16/2008   History of pneumonia 07/16/2008   History of atrial fibrillation    HYPERCHOLESTEROLEMIA     REFERRING DIAG:  M19.90 (ICD-10-CM) - Osteoarthritis, unspecified osteoarthritis type, unspecified site    THERAPY DIAG:  Muscle weakness (generalized)  Other abnormalities of gait and mobility  Balance problem  Rationale for Evaluation and Treatment Rehabilitation  PERTINENT HISTORY: none  PRECAUTIONS: fall  SUBJECTIVE: Pt reports that he had a little bit of soreness following last session, but was okay.  PAIN:  Are you having pain? No   OBJECTIVE: (objective measures completed at initial evaluation unless otherwise dated)  OBJECTIVE:    DIAGNOSTIC FINDINGS: 03/11/2022 Hip CT Scan:   IMPRESSION: 1. Acute minimally impacted right subcapital femoral neck fracture. 2. Mild-to-moderate right hip osteoarthritis.   PATIENT SURVEYS:  07/04/2022: FOTO 58% (projected 69% by visit 13)   COGNITION:           Overall cognitive status: Impaired Pt  reports that memory is not as good as has been.            SENSATION: Reports some tingling and impaired sensation in his lower legs   MUSCLE LENGTH: Tightness in hamstrings and tight hip flexors   POSTURE: rounded shoulders and forward head   PALPATION: Pt with some tenderness to palpation along right hip   LOWER EXTREMITY MMT: 07/04/2022: R hip strength grossly 4 to 4+/5, L hip strength of 5/5   FUNCTIONAL TESTS:  07/04/2022: 5 times sit to stand: 18.3 sec with pushing up on handrails Timed up and go (TUG):  12.4 without assistive device   GAIT: Distance walked: 100 ft Assistive device utilized: None Level of assistance: SBA Comments: Unsteady gait pattern noted       TODAY'S TREATMENT:  08/09/2022: Nustep level 5 (green machine) x8 min with PT present to discuss status Seated with 5# ankle weights:  heel/toe raises, marching, LAQ, hip abduction scissors.  BLE 2x10 each  Sit to/from stand seated on one pad holding 5# kettlebell:  x10 with chest press, x10 with overhead press Standing calf stretch on slanted rocker board 2x20 sec bilat Standing at barre:  hip abduction and hip extension with green loop 2x10 bilat Leg press (seat at 7) 100# 2x10: Vc to control TKE better  08/03/2022: Nustep level 5 (green machine) x8 min with PT present to discuss status Seated with 4# ankle weights:  heel/toe raises, marching, LAQ, hip abduction scissors.  BLE 2x10 each Sit to/from stand seated on one pad:  2x10 without UE use Leg press (seat at 7) 95# 2x10: Vc to control TKE better Standing at barre:  hip abduction and hip extension with green loop 2x10 bilat FWD step ups with 6 inch steps with UE support on handrails x10 bilat  08/01/22: Nustep level 4 (blue machine) x8 min with PTA present to discuss status Seated with ankle weights:  heel/toe raises 4#, marching, LAQ 4# (10x),  BLE 2x10 each Standing hip flexion: 2x10  Bil 4# Sit to/from stand seated on one pad:  2x10 without UE use Leg press (seat at 7) 95# 2x10: Vc to control TKE better Green loop at ankle: side stepping at [facility] 4 lengths with UE support FWD step ups with 6 inch steps with UE support on handrails 2x10 bilat     PATIENT EDUCATION:  Education details: Pt issued HEP Person educated: Patient and Spouse Education method: Explanation, Demonstration, and Handouts Education comprehension: verbalized understanding and returned demonstration     HOME EXERCISE PROGRAM: Access Code: [email or web address] URL:  [email or web address] Date: 07/27/2022 Prepared by: Juel Burrow  Exercises - Seated Heel Toe Raises  - 1 x daily - 7 x weekly - 2 sets - 10 reps - Seated March  - 1 x daily - 7 x weekly - 2 sets - 10 reps - Seated Long Arc Quad  - 1 x daily - 7 x weekly - 2 sets - 10 reps - Seated Hamstring Stretch  - 1 x daily - 7 x weekly - 1 sets - 2 reps - 20 sec hold - Sit to Stand with Armchair  - 1 x daily - 7 x weekly - 2 sets - 5 reps - Standing Hip Abduction with Counter Support  - 1 x daily - 7 x weekly - 2 sets - 10 reps - Standing Hip Extension with Counter Support  - 1 x daily - 7 x weekly - 2 sets - 10 reps - Standing March with Counter  Support  - 1 x daily - 7 x weekly - 2 sets - 10 reps   ASSESSMENT:   CLINICAL IMPRESSION: Mr Cowgill presents to skilled rehabilitation with continued reports of feeling stronger and denying pain.  Pt continues to progress with strengthening and able to increase weight during session today.  Pt without any reports of pain during session, but does still require cuing for posture and technique during session.      OBJECTIVE IMPAIRMENTS decreased balance, difficulty walking, decreased strength, decreased safety awareness, impaired flexibility, and pain.    ACTIVITY LIMITATIONS squatting, stairs, and locomotion level   PARTICIPATION LIMITATIONS: community activity and yard work   PERSONAL FACTORS Age, Time since onset of injury/illness/exacerbation, and 1 comorbidity: OA  are also affecting patient's functional outcome.    REHAB POTENTIAL: Good   CLINICAL DECISION MAKING: Evolving/moderate complexity   EVALUATION COMPLEXITY: Moderate     GOALS: Goals reviewed with patient? Yes   SHORT TERM GOALS: Target date: 07/25/2022  Pt and spouse will be independent with initial HEP. Baseline: Goal status: Goal Met   2.  Pt will report at least a 30% improvements in symptoms since starting PT. Baseline:  Goal status: Goal Met 07/25/2022      LONG TERM GOALS: Target date: 08/29/2022    Pt and spouse will be independent with advanced HEP. Baseline:  Goal status: Ongoing   2.  Pt will increase FOTO score to at least 69% to demonstrate improvements in functional mobility. Baseline: 58% Goal status: INITIAL   3.  Pt will increase right hip strength to at least 5-/5 to allow him to navigate steps with reciprocal pattern. Baseline:  Goal status: INITIAL   4.  Pt will be able to perform 5 times sit to/from stand without use of UE to demonstrate improved functional strength. Baseline:  Goal status: Ongoing   5.  Pt will report pain no greater than 2/10 during errands and other tasks in the community. Baseline:  Goal status: Ongoing     PLAN: PT FREQUENCY: 2x/week   PT DURATION: 8 weeks   PLANNED INTERVENTIONS: Therapeutic exercises, Therapeutic activity, Neuromuscular re-education, Balance training, Gait training, Patient/Family education, Joint manipulation, Joint mobilization, Stair training, Aquatic Therapy, Dry Needling, Electrical stimulation, Cryotherapy, Moist heat, Taping, Ultrasound, Ionotophoresis 4mg /ml Dexamethasone, Manual therapy, and Re-evaluation   PLAN FOR NEXT SESSION: assess and progress HEP as indicated, strengthening, flexibility    Juel Burrow, PT 08/09/22 12:37 PM  [facility] [address], [place], [place] [address] Phone # [phone] Fax [phone]

## 2022-08-11 ENCOUNTER — Ambulatory Visit: Payer: Medicare Other | Admitting: Rehabilitative and Restorative Service Providers"

## 2022-08-11 ENCOUNTER — Encounter: Payer: Self-pay | Admitting: Rehabilitative and Restorative Service Providers"

## 2022-08-11 DIAGNOSIS — R2689 Other abnormalities of gait and mobility: Secondary | ICD-10-CM

## 2022-08-11 DIAGNOSIS — M6281 Muscle weakness (generalized): Secondary | ICD-10-CM

## 2022-08-11 DIAGNOSIS — M25651 Stiffness of right hip, not elsewhere classified: Secondary | ICD-10-CM | POA: Diagnosis not present

## 2022-08-11 NOTE — Therapy (Signed)
OUTPATIENT PHYSICAL THERAPY TREATMENT NOTE   Patient Name: Jason [name] MRN: [id number] DOB:Feb 06, 1941, 81 y.o., male Today's Date: 08/11/2022  PCP: [name], MD REFERRING PROVIDER: [name], MD  END OF SESSION:   PT End of Session - 08/11/22 1156     Visit Number 8    Date for PT Re-Evaluation 08/26/22    Authorization Type Medicare A    Progress Note Due on Visit 10    PT Start Time 1152    PT Stop Time 1230    PT Time Calculation (min) 38 min    Activity Tolerance Patient tolerated treatment well    Behavior During Therapy WFL for tasks assessed/performed              Past Medical History:  Diagnosis Date   Anxiety    Atrial fibrillation ([place])    past hx   Colon polyp    Depression    GERD (gastroesophageal reflux disease)    Gout, unspecified    Hypertension    Osteoarthrosis, unspecified whether generalized or localized, unspecified site    Pneumonia, organism unspecified(486)    Pure hypercholesterolemia    Routine general medical examination at a health care facility    Sleep apnea    not using cpap currently   Unspecified nonpsychotic mental disorder    Past Surgical History:  Procedure Laterality Date   COLONOSCOPY  10/04/2006   jacobs   TOTAL HIP ARTHROPLASTY Right 03/12/2022   Procedure: TOTAL HIP ARTHROPLASTY ANTERIOR APPROACH;  Surgeon: Rod Can, MD;  Location: [facility];  Service: Orthopedics;  Laterality: Right;   UPPER GASTROINTESTINAL ENDOSCOPY  2015   WISDOM TOOTH EXTRACTION     Patient Active Problem List   Diagnosis Date Noted   Acute blood loss as cause of postoperative anemia 03/13/2022   Hip fracture ([place]) 03/12/2022   Accidental fall 03/12/2022   Cognitive impairment 03/12/2022   Personal history of colonic polyps 04/21/2021   Pressure ulcer of left foot 05/14/2020   Pressure ulcer of right foot 05/14/2020   Pain in left foot 05/08/2020   Pain in right foot 05/08/2020   Bilateral impacted cerumen  03/10/2020   Bilateral sensorineural hearing loss 03/10/2020   GAD (generalized anxiety disorder) 12/23/2018   At high risk for falls 09/03/2018   Mood disorder ([place]) 08/18/2015   OSA (obstructive sleep apnea) 01/19/2015   Vitamin D deficiency 01/07/2013   Need for prophylactic vaccination and inoculation against influenza 11/14/2011   Hypertension 11/14/2011   Osteoarthritis 11/20/2009   PSYCHIATRIC DISORDER 07/16/2008   History of pneumonia 07/16/2008   History of atrial fibrillation    HYPERCHOLESTEROLEMIA     REFERRING DIAG:  M19.90 (ICD-10-CM) - Osteoarthritis, unspecified osteoarthritis type, unspecified site    THERAPY DIAG:  Muscle weakness (generalized)  Other abnormalities of gait and mobility  Balance problem  Rationale for Evaluation and Treatment Rehabilitation  PERTINENT HISTORY: none  PRECAUTIONS: fall  SUBJECTIVE: Pt reports that he is feeling stronger.  PAIN:  Are you having pain? No   OBJECTIVE: (objective measures completed at initial evaluation unless otherwise dated)  OBJECTIVE:    DIAGNOSTIC FINDINGS: 03/11/2022 Hip CT Scan:   IMPRESSION: 1. Acute minimally impacted right subcapital femoral neck fracture. 2. Mild-to-moderate right hip osteoarthritis.   PATIENT SURVEYS:  07/04/2022: FOTO 58% (projected 69% by visit 13)   COGNITION:           Overall cognitive status: Impaired Pt reports that memory is not as good as has  been.            SENSATION: Reports some tingling and impaired sensation in his lower legs   MUSCLE LENGTH: Tightness in hamstrings and tight hip flexors   POSTURE: rounded shoulders and forward head   PALPATION: Pt with some tenderness to palpation along right hip   LOWER EXTREMITY MMT: 07/04/2022: R hip strength grossly 4 to 4+/5, L hip strength of 5/5   FUNCTIONAL TESTS:  07/04/2022: 5 times sit to stand: 18.3 sec with pushing up on handrails Timed up and go (TUG): 12.4 without assistive device    GAIT: Distance walked: 100 ft Assistive device utilized: None Level of assistance: SBA Comments: Unsteady gait pattern noted       TODAY'S TREATMENT:  08/11/2022: Nustep level 5 (green machine) x8 min with PT present to discuss status Seated with 5# ankle weights:  heel/toe raises, marching, LAQ, hip abduction scissors.  BLE 2x10 each Sit to/from stand seated on one pad holding 5# kettlebell:  x10 with chest press, x10 with overhead press Hip Matrix 40# hip abduction and hip extension 2x10 bilat  08/09/2022: Nustep level 5 (green machine) x8 min with PT present to discuss status Seated with 5# ankle weights:  heel/toe raises, marching, LAQ, hip abduction scissors.  BLE 2x10 each  Sit to/from stand seated on one pad holding 5# kettlebell:  x10 with chest press, x10 with overhead press Standing calf stretch on slanted rocker board 2x20 sec bilat Standing at barre:  hip abduction and hip extension with green loop 2x10 bilat Leg press (seat at 7) 100# 2x10: Vc to control TKE better  08/03/2022: Nustep level 5 (green machine) x8 min with PT present to discuss status Seated with 4# ankle weights:  heel/toe raises, marching, LAQ, hip abduction scissors.  BLE 2x10 each Sit to/from stand seated on one pad:  2x10 without UE use Leg press (seat at 7) 95# 2x10: Vc to control TKE better Standing at barre:  hip abduction and hip extension with green loop 2x10 bilat FWD step ups with 6 inch steps with UE support on handrails x10 bilat     PATIENT EDUCATION:  Education details: Pt issued HEP Person educated: Patient and Spouse Education method: Explanation, Demonstration, and Handouts Education comprehension: verbalized understanding and returned demonstration     HOME EXERCISE PROGRAM: Access Code: [email or web address] URL: [email or web address] Date: 07/27/2022 Prepared by: Juel Burrow  Exercises - Seated Heel Toe Raises  - 1 x daily - 7 x weekly - 2 sets - 10 reps - Seated March   - 1 x daily - 7 x weekly - 2 sets - 10 reps - Seated Long Arc Quad  - 1 x daily - 7 x weekly - 2 sets - 10 reps - Seated Hamstring Stretch  - 1 x daily - 7 x weekly - 1 sets - 2 reps - 20 sec hold - Sit to Stand with Armchair  - 1 x daily - 7 x weekly - 2 sets - 5 reps - Standing Hip Abduction with Counter Support  - 1 x daily - 7 x weekly - 2 sets - 10 reps - Standing Hip Extension with Counter Support  - 1 x daily - 7 x weekly - 2 sets - 10 reps - Standing March with Counter Support  - 1 x daily - 7 x weekly - 2 sets - 10 reps   ASSESSMENT:   CLINICAL IMPRESSION: Mr Mossberg presents to skilled rehabilitation with continued progress towards goal related activities.  Pt reports that he is about 60-70% improved since initial evaluation.  Pt able to tolerate hip matrix weight machine today and reports that he is feeling more balanced at home.  Pt continues to require skilled PT to progress towards goal related activities.      OBJECTIVE IMPAIRMENTS decreased balance, difficulty walking, decreased strength, decreased safety awareness, impaired flexibility, and pain.    ACTIVITY LIMITATIONS squatting, stairs, and locomotion level   PARTICIPATION LIMITATIONS: community activity and yard work   PERSONAL FACTORS Age, Time since onset of injury/illness/exacerbation, and 1 comorbidity: OA  are also affecting patient's functional outcome.    REHAB POTENTIAL: Good   CLINICAL DECISION MAKING: Evolving/moderate complexity   EVALUATION COMPLEXITY: Moderate     GOALS: Goals reviewed with patient? Yes   SHORT TERM GOALS: Target date: 07/25/2022  Pt and spouse will be independent with initial HEP. Baseline: Goal status: Goal Met   2.  Pt will report at least a 30% improvements in symptoms since starting PT. Baseline:  Goal status: Goal Met 07/25/2022     LONG TERM GOALS: Target date: 08/29/2022    Pt and spouse will be independent with advanced HEP. Baseline:  Goal status: Ongoing   2.  Pt  will increase FOTO score to at least 69% to demonstrate improvements in functional mobility. Baseline: 58% Goal status: INITIAL   3.  Pt will increase right hip strength to at least 5-/5 to allow him to navigate steps with reciprocal pattern. Baseline:  Goal status: INITIAL   4.  Pt will be able to perform 5 times sit to/from stand without use of UE to demonstrate improved functional strength. Baseline:  Goal status: Ongoing   5.  Pt will report pain no greater than 2/10 during errands and other tasks in the community. Baseline:  Goal status: Ongoing     PLAN: PT FREQUENCY: 2x/week   PT DURATION: 8 weeks   PLANNED INTERVENTIONS: Therapeutic exercises, Therapeutic activity, Neuromuscular re-education, Balance training, Gait training, Patient/Family education, Joint manipulation, Joint mobilization, Stair training, Aquatic Therapy, Dry Needling, Electrical stimulation, Cryotherapy, Moist heat, Taping, Ultrasound, Ionotophoresis 71m/ml Dexamethasone, Manual therapy, and Re-evaluation   PLAN FOR NEXT SESSION: assess and progress HEP as indicated, strengthening, flexibility    SJuel Burrow PT 08/11/22 12:32 PM  [facility] [address] [place] [address]Phone # [phone] [phone]

## 2022-08-17 ENCOUNTER — Ambulatory Visit: Payer: Medicare Other | Admitting: Rehabilitative and Restorative Service Providers"

## 2022-08-17 ENCOUNTER — Encounter: Payer: Self-pay | Admitting: Rehabilitative and Restorative Service Providers"

## 2022-08-17 DIAGNOSIS — M6281 Muscle weakness (generalized): Secondary | ICD-10-CM

## 2022-08-17 DIAGNOSIS — R2689 Other abnormalities of gait and mobility: Secondary | ICD-10-CM

## 2022-08-17 DIAGNOSIS — M25651 Stiffness of right hip, not elsewhere classified: Secondary | ICD-10-CM | POA: Diagnosis not present

## 2022-08-17 NOTE — Therapy (Signed)
OUTPATIENT PHYSICAL THERAPY TREATMENT NOTE   Patient Name: Jason [name] MRN: [id number] DOB:1941/06/27, 81 y.o., male Today's Date: 08/17/2022  PCP: [name], MD REFERRING PROVIDER: [name], MD  END OF SESSION:   PT End of Session - 08/17/22 1106     Visit Number 9    Date for PT Re-Evaluation 08/26/22    Authorization Type Medicare A    Progress Note Due on Visit 10    PT Start Time 1102    PT Stop Time 1140    PT Time Calculation (min) 38 min    Activity Tolerance Patient tolerated treatment well    Behavior During Therapy WFL for tasks assessed/performed              Past Medical History:  Diagnosis Date   Anxiety    Atrial fibrillation ([place])    past hx   Colon polyp    Depression    GERD (gastroesophageal reflux disease)    Gout, unspecified    Hypertension    Osteoarthrosis, unspecified whether generalized or localized, unspecified site    Pneumonia, organism unspecified(486)    Pure hypercholesterolemia    Routine general medical examination at a health care facility    Sleep apnea    not using cpap currently   Unspecified nonpsychotic mental disorder    Past Surgical History:  Procedure Laterality Date   COLONOSCOPY  10/04/2006   jacobs   TOTAL HIP ARTHROPLASTY Right 03/12/2022   Procedure: TOTAL HIP ARTHROPLASTY ANTERIOR APPROACH;  Surgeon: Rod Can, MD;  Location: [facility];  Service: Orthopedics;  Laterality: Right;   UPPER GASTROINTESTINAL ENDOSCOPY  2015   WISDOM TOOTH EXTRACTION     Patient Active Problem List   Diagnosis Date Noted   Acute blood loss as cause of postoperative anemia 03/13/2022   Hip fracture ([place]) 03/12/2022   Accidental fall 03/12/2022   Cognitive impairment 03/12/2022   Personal history of colonic polyps 04/21/2021   Pressure ulcer of left foot 05/14/2020   Pressure ulcer of right foot 05/14/2020   Pain in left foot 05/08/2020   Pain in right foot 05/08/2020   Bilateral impacted cerumen  03/10/2020   Bilateral sensorineural hearing loss 03/10/2020   GAD (generalized anxiety disorder) 12/23/2018   At high risk for falls 09/03/2018   Mood disorder ([place]) 08/18/2015   OSA (obstructive sleep apnea) 01/19/2015   Vitamin D deficiency 01/07/2013   Need for prophylactic vaccination and inoculation against influenza 11/14/2011   Hypertension 11/14/2011   Osteoarthritis 11/20/2009   PSYCHIATRIC DISORDER 07/16/2008   History of pneumonia 07/16/2008   History of atrial fibrillation    HYPERCHOLESTEROLEMIA     REFERRING DIAG:  M19.90 (ICD-10-CM) - Osteoarthritis, unspecified osteoarthritis type, unspecified site    THERAPY DIAG:  Muscle weakness (generalized)  Other abnormalities of gait and mobility  Balance problem  Stiffness of right hip, not elsewhere classified  Rationale for Evaluation and Treatment Rehabilitation  PERTINENT HISTORY: none  PRECAUTIONS: fall  SUBJECTIVE: Pt reports that he is doing well.  PAIN:  Are you having pain? No   OBJECTIVE: (objective measures completed at initial evaluation unless otherwise dated)  OBJECTIVE:    DIAGNOSTIC FINDINGS: 03/11/2022 Hip CT Scan:   IMPRESSION: 1. Acute minimally impacted right subcapital femoral neck fracture. 2. Mild-to-moderate right hip osteoarthritis.   PATIENT SURVEYS:  07/04/2022: FOTO 58% (projected 69% by visit 13)   COGNITION:           Overall cognitive status: Impaired Pt reports  that memory is not as good as has been.            SENSATION: Reports some tingling and impaired sensation in his lower legs   MUSCLE LENGTH: Tightness in hamstrings and tight hip flexors   POSTURE: rounded shoulders and forward head   PALPATION: Pt with some tenderness to palpation along right hip   LOWER EXTREMITY MMT: 07/04/2022: R hip strength grossly 4 to 4+/5, L hip strength of 5/5   FUNCTIONAL TESTS:  07/04/2022: 5 times sit to stand: 18.3 sec with pushing up on handrails Timed up and go (TUG):  12.4 without assistive device   GAIT: Distance walked: 100 ft Assistive device utilized: None Level of assistance: SBA Comments: Unsteady gait pattern noted       TODAY'S TREATMENT:  08/17/2022: Nustep level 6 (green machine) x8 min with PT present to discuss status Seated with 6# ankle weights:  heel/toe raises, marching, LAQ, hip abduction scissors.  BLE 2x10 each Hip Matrix 40# hip abduction and hip extension 2x10 bilat Leg press (seat at 7) 120# 2x10 Standing calf stretch on 2" step 2x20 sec Alt LE heel tap from standing on 2" step BLE 2x10  08/11/2022: Nustep level 5 (green machine) x8 min with PT present to discuss status Seated with 5# ankle weights:  heel/toe raises, marching, LAQ, hip abduction scissors.  BLE 2x10 each Sit to/from stand seated on one pad holding 5# kettlebell:  x10 with chest press, x10 with overhead press Hip Matrix 40# hip abduction and hip extension 2x10 bilat  08/09/2022: Nustep level 5 (green machine) x8 min with PT present to discuss status Seated with 5# ankle weights:  heel/toe raises, marching, LAQ, hip abduction scissors.  BLE 2x10 each  Sit to/from stand seated on one pad holding 5# kettlebell:  x10 with chest press, x10 with overhead press Standing calf stretch on slanted rocker board 2x20 sec bilat Standing at barre:  hip abduction and hip extension with green loop 2x10 bilat Leg press (seat at 7) 100# 2x10: Vc to control TKE better     PATIENT EDUCATION:  Education details: Pt issued HEP Person educated: Patient and Spouse Education method: Explanation, Demonstration, and Handouts Education comprehension: verbalized understanding and returned demonstration     HOME EXERCISE PROGRAM: Access Code: [email or web address] URL: [email or web address] Date: 07/27/2022 Prepared by: Juel Burrow  Exercises - Seated Heel Toe Raises  - 1 x daily - 7 x weekly - 2 sets - 10 reps - Seated March  - 1 x daily - 7 x weekly - 2 sets - 10 reps -  Seated Long Arc Quad  - 1 x daily - 7 x weekly - 2 sets - 10 reps - Seated Hamstring Stretch  - 1 x daily - 7 x weekly - 1 sets - 2 reps - 20 sec hold - Sit to Stand with Armchair  - 1 x daily - 7 x weekly - 2 sets - 5 reps - Standing Hip Abduction with Counter Support  - 1 x daily - 7 x weekly - 2 sets - 10 reps - Standing Hip Extension with Counter Support  - 1 x daily - 7 x weekly - 2 sets - 10 reps - Standing March with Counter Support  - 1 x daily - 7 x weekly - 2 sets - 10 reps   ASSESSMENT:   CLINICAL IMPRESSION: Mr Shatswell presents to skilled rehabilitation with continued reports of feeling stronger at home.  Pt able to progress with strengthening exercises.  Pt to be reassessed next week for discharge vs continued PT, with anticipated discharge if goals met.  Pt continues to require cuing throughout to stay on task and perform exercises with correct technique secondary to cognition.       OBJECTIVE IMPAIRMENTS decreased balance, difficulty walking, decreased strength, decreased safety awareness, impaired flexibility, and pain.    ACTIVITY LIMITATIONS squatting, stairs, and locomotion level   PARTICIPATION LIMITATIONS: community activity and yard work   PERSONAL FACTORS Age, Time since onset of injury/illness/exacerbation, and 1 comorbidity: OA  are also affecting patient's functional outcome.    REHAB POTENTIAL: Good   CLINICAL DECISION MAKING: Evolving/moderate complexity   EVALUATION COMPLEXITY: Moderate     GOALS: Goals reviewed with patient? Yes   SHORT TERM GOALS: Target date: 07/25/2022  Pt and spouse will be independent with initial HEP. Baseline: Goal status: Goal Met   2.  Pt will report at least a 30% improvements in symptoms since starting PT. Baseline:  Goal status: Goal Met 07/25/2022     LONG TERM GOALS: Target date: 08/29/2022    Pt and spouse will be independent with advanced HEP. Baseline:  Goal status: Ongoing   2.  Pt will increase FOTO score to at  least 69% to demonstrate improvements in functional mobility. Baseline: 58% Goal status: INITIAL   3.  Pt will increase right hip strength to at least 5-/5 to allow him to navigate steps with reciprocal pattern. Baseline:  Goal status: INITIAL   4.  Pt will be able to perform 5 times sit to/from stand without use of UE to demonstrate improved functional strength. Baseline:  Goal status: Ongoing   5.  Pt will report pain no greater than 2/10 during errands and other tasks in the community. Baseline:  Goal status: Goal Met 08/17/2022     PLAN: PT FREQUENCY: 2x/week   PT DURATION: 8 weeks   PLANNED INTERVENTIONS: Therapeutic exercises, Therapeutic activity, Neuromuscular re-education, Balance training, Gait training, Patient/Family education, Joint manipulation, Joint mobilization, Stair training, Aquatic Therapy, Dry Needling, Electrical stimulation, Cryotherapy, Moist heat, Taping, Ultrasound, Ionotophoresis 62m/ml Dexamethasone, Manual therapy, and Re-evaluation   PLAN FOR NEXT SESSION: assess and progress HEP as indicated, strengthening, flexibility    SJuel Burrow PT 08/17/22 11:45 AM  [facility] [address] SSevilleGGilbert [place] [address]Phone # [phone] [phone]

## 2022-08-19 ENCOUNTER — Encounter: Payer: Self-pay | Admitting: Physical Therapy

## 2022-08-19 ENCOUNTER — Ambulatory Visit: Payer: Medicare Other | Admitting: Physical Therapy

## 2022-08-19 DIAGNOSIS — R2689 Other abnormalities of gait and mobility: Secondary | ICD-10-CM

## 2022-08-19 DIAGNOSIS — M6281 Muscle weakness (generalized): Secondary | ICD-10-CM

## 2022-08-19 DIAGNOSIS — M25651 Stiffness of right hip, not elsewhere classified: Secondary | ICD-10-CM | POA: Diagnosis not present

## 2022-08-19 NOTE — Therapy (Addendum)
OUTPATIENT PHYSICAL THERAPY TREATMENT NOTE   Patient Name: Jason [name] MRN: [id number] DOB:[birth year], 81 y.o., male Today's Date: 08/19/2022  PCP: [name], MD REFERRING PROVIDER: [name], MD   Progress Note Reporting Period 07/04/22 to 08/19/22  See note below for Objective Data and Assessment of Progress/Goals.        END OF SESSION:   PT End of Session - 08/19/22 1109     Visit Number 10    Date for PT Re-Evaluation 08/26/22    Authorization Type Medicare A    Progress Note Due on Visit 10    PT Start Time 1108   pt late   PT Stop Time 1140    PT Time Calculation (min) 32 min    Activity Tolerance Patient tolerated treatment well    Behavior During Therapy WFL for tasks assessed/performed               Past Medical History:  Diagnosis Date   Anxiety    Atrial fibrillation ([facility])    past hx   Colon polyp    Depression    GERD (gastroesophageal reflux disease)    Gout, unspecified    Hypertension    Osteoarthrosis, unspecified whether generalized or localized, unspecified site    Pneumonia, organism unspecified(486)    Pure hypercholesterolemia    Routine general medical examination at a health care facility    Sleep apnea    not using cpap currently   Unspecified nonpsychotic mental disorder    Past Surgical History:  Procedure Laterality Date   COLONOSCOPY  10/04/2006   jacobs   TOTAL HIP ARTHROPLASTY Right 03/12/2022   Procedure: TOTAL HIP ARTHROPLASTY ANTERIOR APPROACH;  Surgeon: Rod Can, MD;  Location: [facility];  Service: Orthopedics;  Laterality: Right;   UPPER GASTROINTESTINAL ENDOSCOPY  2015   WISDOM TOOTH EXTRACTION     Patient Active Problem List   Diagnosis Date Noted   Acute blood loss as cause of postoperative anemia 03/13/2022   Hip fracture ([place]) 03/12/2022   Accidental fall 03/12/2022   Cognitive impairment 03/12/2022   Personal history of colonic polyps 04/21/2021   Pressure ulcer of left foot  05/14/2020   Pressure ulcer of right foot 05/14/2020   Pain in left foot 05/08/2020   Pain in right foot 05/08/2020   Bilateral impacted cerumen 03/10/2020   Bilateral sensorineural hearing loss 03/10/2020   GAD (generalized anxiety disorder) 12/23/2018   At high risk for falls 09/03/2018   Mood disorder ([place]) 08/18/2015   OSA (obstructive sleep apnea) 01/19/2015   Vitamin D deficiency 01/07/2013   Need for prophylactic vaccination and inoculation against influenza 11/14/2011   Hypertension 11/14/2011   Osteoarthritis 11/20/2009   PSYCHIATRIC DISORDER 07/16/2008   History of pneumonia 07/16/2008   History of atrial fibrillation    HYPERCHOLESTEROLEMIA     REFERRING DIAG:  M19.90 (ICD-10-CM) - Osteoarthritis, unspecified osteoarthritis type, unspecified site    THERAPY DIAG:  Muscle weakness (generalized)  Other abnormalities of gait and mobility  Balance problem  Stiffness of right hip, not elsewhere classified  Rationale for Evaluation and Treatment Rehabilitation  PERTINENT HISTORY: none  PRECAUTIONS: fall  SUBJECTIVE: The Pt has made my legs stronger. No current pain.  PAIN:  Are you having pain? No   OBJECTIVE: (objective measures completed at initial evaluation unless otherwise dated)  OBJECTIVE:    DIAGNOSTIC FINDINGS: 03/11/2022 Hip CT Scan:   IMPRESSION: 1. Acute minimally impacted right subcapital femoral neck fracture. 2. Mild-to-moderate right hip  osteoarthritis.   PATIENT SURVEYS:  07/04/2022: FOTO 58% (projected 69% by visit 13) 08/19/22: FOTO 91%   COGNITION:           Overall cognitive status: Impaired Pt reports that memory is not as good as has been.            SENSATION: Reports some tingling and impaired sensation in his lower legs   MUSCLE LENGTH: Tightness in hamstrings and tight hip flexors   POSTURE: rounded shoulders and forward head   PALPATION: Pt with some tenderness to palpation along right hip   LOWER EXTREMITY  MMT: 07/04/2022: R hip strength grossly 4 to 4+/5, L hip strength of 5/5  08/19/22:R hip strength grossly 4 to 4+/5, L hip strength of 5/5    FUNCTIONAL TESTS:  07/04/2022: 5 times sit to stand: 18.3 sec with pushing up on handrails Timed up and go (TUG): 12.4 without assistive device  08/19/22:  5x sit to stand: 12 sec and no UE Timed Up and Go: 10 sec   GAIT: Distance walked: 100 ft Assistive device utilized: None Level of assistance: SBA Comments: Unsteady gait pattern noted       TODAY'S TREATMENT:   08/19/22: Nustep L3 (old model) x5 min for LE warm up FOTO, TUG, and 5x sit to stand see above Leg press Bil; 120# 2x10 Forward step ups Bil 10x2 with use of rails Hip abd machine Bil 40# 10x Assit with machine set up    08/17/2022: Nustep level 6 (green machine) x8 min with PT present to discuss status Seated with 6# ankle weights:  heel/toe raises, marching, LAQ, hip abduction scissors.  BLE 2x10 each Hip Matrix 40# hip abduction and hip extension 2x10 bilat Leg press (seat at 7) 120# 2x10 Standing calf stretch on 2" step 2x20 sec Alt LE heel tap from standing on 2" step BLE 2x10  08/11/2022: Nustep level 5 (green machine) x8 min with PT present to discuss status Seated with 5# ankle weights:  heel/toe raises, marching, LAQ, hip abduction scissors.  BLE 2x10 each Sit to/from stand seated on one pad holding 5# kettlebell:  x10 with chest press, x10 with overhead press Hip Matrix 40# hip abduction and hip extension 2x10 bilat    PATIENT EDUCATION:  Education details: Pt issued HEP Person educated: Patient and Spouse Education method: Explanation, Demonstration, and Handouts Education comprehension: verbalized understanding and returned demonstration     HOME EXERCISE PROGRAM: Access Code: [email or web address] URL: [email or web address] Date: 07/27/2022 Prepared by: Juel Burrow  Exercises - Seated Heel Toe Raises  - 1 x daily - 7 x weekly - 2 sets - 10 reps -  Seated March  - 1 x daily - 7 x weekly - 2 sets - 10 reps - Seated Long Arc Quad  - 1 x daily - 7 x weekly - 2 sets - 10 reps - Seated Hamstring Stretch  - 1 x daily - 7 x weekly - 1 sets - 2 reps - 20 sec hold - Sit to Stand with Armchair  - 1 x daily - 7 x weekly - 2 sets - 5 reps - Standing Hip Abduction with Counter Support  - 1 x daily - 7 x weekly - 2 sets - 10 reps - Standing Hip Extension with Counter Support  - 1 x daily - 7 x weekly - 2 sets - 10 reps - Standing March with Counter Support  - 1 x daily - 7 x weekly - 2 sets - 10 reps  ASSESSMENT:   CLINICAL IMPRESSION: Pt arrives pain free reporting greater strength in his LE. TUG and 5x sit to stand both improved. FOTO score also improved and meeting the goals. Pt verbally reports standing to shave in the morning is his biggest improvement as he doesn't fall into the counter anymore and loose his balance.      OBJECTIVE IMPAIRMENTS decreased balance, difficulty walking, decreased strength, decreased safety awareness, impaired flexibility, and pain.    ACTIVITY LIMITATIONS squatting, stairs, and locomotion level   PARTICIPATION LIMITATIONS: community activity and yard work   PERSONAL FACTORS Age, Time since onset of injury/illness/exacerbation, and 1 comorbidity: OA  are also affecting patient's functional outcome.    REHAB POTENTIAL: Good   CLINICAL DECISION MAKING: Evolving/moderate complexity   EVALUATION COMPLEXITY: Moderate     GOALS: Goals reviewed with patient? Yes   SHORT TERM GOALS: Target date: 07/25/2022  Pt and spouse will be independent with initial HEP. Baseline: Goal status: Goal Met   2.  Pt will report at least a 30% improvements in symptoms since starting PT. Baseline:  Goal status: Goal Met 07/25/2022     LONG TERM GOALS: Target date: 08/29/2022    Pt and spouse will be independent with advanced HEP. Baseline:  Goal status: Ongoing   2.  Pt will increase FOTO score to at least 69% to  demonstrate improvements in functional mobility. Baseline: 58% Goal status: 91%  Goal met   3.  Pt will increase right hip strength to at least 5-/5 to allow him to navigate steps with reciprocal pattern. Baseline:  Goal status: On going: see MMT above  4.  Pt will be able to perform 5 times sit to/from stand without use of UE to demonstrate improved functional strength. Baseline:  Goal status: Goal met 08/19/22   5.  Pt will report pain no greater than 2/10 during errands and other tasks in the community. Baseline:  Goal status: Goal Met 08/17/2022     PLAN: PT FREQUENCY: 2x/week   PT DURATION: 8 weeks   PLANNED INTERVENTIONS: Therapeutic exercises, Therapeutic activity, Neuromuscular re-education, Balance training, Gait training, Patient/Family education, Joint manipulation, Joint mobilization, Stair training, Aquatic Therapy, Dry Needling, Electrical stimulation, Cryotherapy, Moist heat, Taping, Ultrasound, Ionotophoresis 18m/ml Dexamethasone, Manual therapy, and Re-evaluation   PLAN FOR NEXT SESSION: Finalize HEP and probable DC next visit.     JMyrene Galas PTA 08/19/22 11:38 AM  SJuel Burrow PT, DPT 08/19/2022, 11:55 AM  [facility] Station[address] SBigforkGRaymond K[place] [address]Phone # [phone] [phone]

## 2022-08-22 ENCOUNTER — Encounter: Payer: Self-pay | Admitting: Rehabilitative and Restorative Service Providers"

## 2022-08-22 ENCOUNTER — Ambulatory Visit: Payer: Medicare Other | Admitting: Rehabilitative and Restorative Service Providers"

## 2022-08-22 DIAGNOSIS — M25651 Stiffness of right hip, not elsewhere classified: Secondary | ICD-10-CM

## 2022-08-22 DIAGNOSIS — M6281 Muscle weakness (generalized): Secondary | ICD-10-CM

## 2022-08-22 DIAGNOSIS — R2689 Other abnormalities of gait and mobility: Secondary | ICD-10-CM | POA: Diagnosis not present

## 2022-08-22 NOTE — Therapy (Signed)
OUTPATIENT PHYSICAL THERAPY TREATMENT NOTE   Patient Name: Jason [name] MRN: [id number] DOB:1941-12-17, 81 y.o., male Today's Date: 08/22/2022  PCP: [name], MD REFERRING PROVIDER: [name], MD     END OF SESSION:   PT End of Session - 08/22/22 1151     Visit Number 11    Date for PT Re-Evaluation 08/26/22    Authorization Type Medicare A    Progress Note Due on Visit 20    PT Start Time 1149    PT Stop Time 1227    PT Time Calculation (min) 38 min    Activity Tolerance Patient tolerated treatment well    Behavior During Therapy WFL for tasks assessed/performed               Past Medical History:  Diagnosis Date   Anxiety    Atrial fibrillation ([place])    past hx   Colon polyp    Depression    GERD (gastroesophageal reflux disease)    Gout, unspecified    Hypertension    Osteoarthrosis, unspecified whether generalized or localized, unspecified site    Pneumonia, organism unspecified(486)    Pure hypercholesterolemia    Routine general medical examination at a health care facility    Sleep apnea    not using cpap currently   Unspecified nonpsychotic mental disorder    Past Surgical History:  Procedure Laterality Date   COLONOSCOPY  10/04/2006   jacobs   TOTAL HIP ARTHROPLASTY Right 03/12/2022   Procedure: TOTAL HIP ARTHROPLASTY ANTERIOR APPROACH;  Surgeon: Rod Can, MD;  Location: [facility];  Service: Orthopedics;  Laterality: Right;   UPPER GASTROINTESTINAL ENDOSCOPY  2015   WISDOM TOOTH EXTRACTION     Patient Active Problem List   Diagnosis Date Noted   Acute blood loss as cause of postoperative anemia 03/13/2022   Hip fracture ([place]) 03/12/2022   Accidental fall 03/12/2022   Cognitive impairment 03/12/2022   Personal history of colonic polyps 04/21/2021   Pressure ulcer of left foot 05/14/2020   Pressure ulcer of right foot 05/14/2020   Pain in left foot 05/08/2020   Pain in right foot 05/08/2020   Bilateral impacted  cerumen 03/10/2020   Bilateral sensorineural hearing loss 03/10/2020   GAD (generalized anxiety disorder) 12/23/2018   At high risk for falls 09/03/2018   Mood disorder ([place]) 08/18/2015   OSA (obstructive sleep apnea) 01/19/2015   Vitamin D deficiency 01/07/2013   Need for prophylactic vaccination and inoculation against influenza 11/14/2011   Hypertension 11/14/2011   Osteoarthritis 11/20/2009   PSYCHIATRIC DISORDER 07/16/2008   History of pneumonia 07/16/2008   History of atrial fibrillation    HYPERCHOLESTEROLEMIA     REFERRING DIAG:  M19.90 (ICD-10-CM) - Osteoarthritis, unspecified osteoarthritis type, unspecified site    THERAPY DIAG:  Muscle weakness (generalized)  Other abnormalities of gait and mobility  Balance problem  Stiffness of right hip, not elsewhere classified  Rationale for Evaluation and Treatment Rehabilitation  PERTINENT HISTORY: none  PRECAUTIONS: fall  SUBJECTIVE: Pt reports no new changes.  His wife states that she is going to add him onto her gym membership at [employer].  PAIN:  Are you having pain? No   OBJECTIVE: (objective measures completed at initial evaluation unless otherwise dated)  OBJECTIVE:    DIAGNOSTIC FINDINGS: 03/11/2022 Hip CT Scan:   IMPRESSION: 1. Acute minimally impacted right subcapital femoral neck fracture. 2. Mild-to-moderate right hip osteoarthritis.   PATIENT SURVEYS:  07/04/2022: FOTO 58% (projected 69% by visit 60)  08/19/22: FOTO 91%   COGNITION:           Overall cognitive status: Impaired Pt reports that memory is not as good as has been.            SENSATION: Reports some tingling and impaired sensation in his lower legs   MUSCLE LENGTH: Tightness in hamstrings and tight hip flexors   POSTURE: rounded shoulders and forward head   PALPATION: Pt with some tenderness to palpation along right hip   LOWER EXTREMITY MMT: 07/04/2022: R hip strength grossly 4 to 4+/5, L hip strength of 5/5  08/19/22: R hip  strength grossly 4 to 4+/5, L hip strength of 5/5 08/22/22:  R hip strength grossly 5-/5 throughout, L hip strength 5/5    FUNCTIONAL TESTS:  07/04/2022: 5 times sit to stand: 18.3 sec with pushing up on handrails Timed up and go (TUG): 12.4 without assistive device  08/19/22:  5x sit to stand: 12 sec and no UE Timed Up and Go: 10 sec   GAIT: Distance walked: 100 ft Assistive device utilized: None Level of assistance: SBA Comments: Unsteady gait pattern noted       TODAY'S TREATMENT:  08/22/2022: Seated with 6# ankle weights:  heel/toe raises, marching, LAQ, hip abduction scissors.  BLE 2x10 each Nustep level 6 (green machine) x8 min with PT present to discuss status Hip Matrix 40# hip abduction and hip extension 2x10 bilat Leg press (seat at 7) 130# 2x10 Standing calf stretch on 2" step 2x20 sec    08/19/22: Nustep L3 (old model) x5 min for LE warm up FOTO, TUG, and 5x sit to stand see above Leg press Bil; 120# 2x10 Forward step ups Bil 10x2 with use of rails Hip abd machine Bil 40# 10x Assit with machine set up    08/17/2022: Nustep level 6 (green machine) x8 min with PT present to discuss status Seated with 6# ankle weights:  heel/toe raises, marching, LAQ, hip abduction scissors.  BLE 2x10 each Hip Matrix 40# hip abduction and hip extension 2x10 bilat Leg press (seat at 7) 120# 2x10 Standing calf stretch on 2" step 2x20 sec Alt LE heel tap from standing on 2" step BLE 2x10     PATIENT EDUCATION:  Education details: Pt issued HEP Person educated: Patient and Spouse Education method: Explanation, Demonstration, and Handouts Education comprehension: verbalized understanding and returned demonstration     HOME EXERCISE PROGRAM: Access Code: [email or web address] URL: [email or web address] Date: 07/27/2022 Prepared by: Reather Laurence  Exercises - Seated Heel Toe Raises  - 1 x daily - 7 x weekly - 2 sets - 10 reps - Seated March  - 1 x daily - 7 x weekly - 2  sets - 10 reps - Seated Long Arc Quad  - 1 x daily - 7 x weekly - 2 sets - 10 reps - Seated Hamstring Stretch  - 1 x daily - 7 x weekly - 1 sets - 2 reps - 20 sec hold - Sit to Stand with Armchair  - 1 x daily - 7 x weekly - 2 sets - 5 reps - Standing Hip Abduction with Counter Support  - 1 x daily - 7 x weekly - 2 sets - 10 reps - Standing Hip Extension with Counter Support  - 1 x daily - 7 x weekly - 2 sets - 10 reps - Standing March with Counter Support  - 1 x daily - 7 x weekly - 2 sets - 10 reps   ASSESSMENT:  CLINICAL IMPRESSION: Mr Rhoads presents to skilled rehabilitation stating that he is doing well.  Pt requires cuing during MMT for accurate measurement secondary to cognition.  Pt able to progress with increased strengthening.  All goals have been met and pt is compliant with HEP.  Wife states that she will add the pt onto her gym membership to allow him to continue to progress towards goals.  Pt discharged at this time to continue with HEP.     OBJECTIVE IMPAIRMENTS decreased balance, difficulty walking, decreased strength, decreased safety awareness, impaired flexibility, and pain.    ACTIVITY LIMITATIONS squatting, stairs, and locomotion level   PARTICIPATION LIMITATIONS: community activity and yard work   PERSONAL FACTORS Age, Time since onset of injury/illness/exacerbation, and 1 comorbidity: OA  are also affecting patient's functional outcome.    REHAB POTENTIAL: Good   CLINICAL DECISION MAKING: Evolving/moderate complexity   EVALUATION COMPLEXITY: Moderate     GOALS: Goals reviewed with patient? Yes   SHORT TERM GOALS: Target date: 07/25/2022  Pt and spouse will be independent with initial HEP. Baseline: Goal status: Goal Met   2.  Pt will report at least a 30% improvements in symptoms since starting PT. Baseline:  Goal status: Goal Met 07/25/2022     LONG TERM GOALS: Target date: 08/29/2022    Pt and spouse will be independent with advanced HEP. Baseline:   Goal status: Goal Met 08/22/22   2.  Pt will increase FOTO score to at least 69% to demonstrate improvements in functional mobility. Baseline: 58% Goal status: 91%  Goal met   3.  Pt will increase right hip strength to at least 5-/5 to allow him to navigate steps with reciprocal pattern. Baseline:  Goal status: Goal Met 08/22/22  4.  Pt will be able to perform 5 times sit to/from stand without use of UE to demonstrate improved functional strength. Baseline:  Goal status: Goal met 08/19/22   5.  Pt will report pain no greater than 2/10 during errands and other tasks in the community. Baseline:  Goal status: Goal Met 08/17/2022     PLAN: PT FREQUENCY: 2x/week   PT DURATION: 8 weeks   PLANNED INTERVENTIONS: Therapeutic exercises, Therapeutic activity, Neuromuscular re-education, Balance training, Gait training, Patient/Family education, Joint manipulation, Joint mobilization, Stair training, Aquatic Therapy, Dry Needling, Electrical stimulation, Cryotherapy, Moist heat, Taping, Ultrasound, Ionotophoresis 4mg /ml Dexamethasone, Manual therapy, and Re-evaluation   PLAN FOR NEXT SESSION: Discharge completed on 08/22/22    PHYSICAL THERAPY DISCHARGE SUMMARY   Patient agrees to discharge. Patient goals were met. Patient is being discharged due to meeting the stated rehab goals.     Juel Burrow, PT 08/22/22 12:44 PM   [facility] [address] [place], [place] [address] Phone # [phone] Fax [phone]

## 2022-08-24 ENCOUNTER — Ambulatory Visit: Payer: Medicare Other | Admitting: Rehabilitative and Restorative Service Providers"

## 2022-09-06 ENCOUNTER — Encounter: Payer: Self-pay | Admitting: Neurology

## 2022-09-06 ENCOUNTER — Ambulatory Visit (INDEPENDENT_AMBULATORY_CARE_PROVIDER_SITE_OTHER): Payer: Medicare Other | Admitting: Neurology

## 2022-09-06 ENCOUNTER — Telehealth: Payer: Self-pay | Admitting: Neurology

## 2022-09-06 VITALS — BP 115/73 | HR 67 | Ht 74.0 in | Wt 199.0 lb

## 2022-09-06 DIAGNOSIS — F09 Unspecified mental disorder due to known physiological condition: Secondary | ICD-10-CM

## 2022-09-06 NOTE — Patient Instructions (Signed)
Continue current medications  MRI Brain without contrast  Referral for formal neuropsychiatry testing  Return in a year or sooner if worse    There are well-accepted and sensible ways to reduce risk for Alzheimers disease and other degenerative brain disorders .  Exercise Daily Walk A daily 20 minute walk should be part of your routine. Disease related apathy can be a significant roadblock to exercise and the only way to overcome this is to make it a daily routine and perhaps have a reward at the end (something your loved one loves to eat or drink perhaps) or a personal trainer coming to the home can also be very useful. Most importantly, the patient is much more likely to exercise if the caregiver / spouse does it with him/her. In general a structured, repetitive schedule is best.  General Health: Any diseases which effect your body will effect your brain such as a pneumonia, urinary infection, blood clot, heart attack or stroke. Keep contact with your primary care doctor for regular follow ups.  Sleep. A good nights sleep is healthy for the brain. Seven hours is recommended. If you have insomnia or poor sleep habits we can give you some instructions. If you have sleep apnea wear your mask.  Diet: Eating a heart healthy diet is also a good idea; fish and poultry instead of red meat, nuts (mostly non-peanuts), vegetables, fruits, olive oil or canola oil (instead of butter), minimal salt (use other spices to flavor foods), whole grain rice, bread, cereal and pasta and wine in moderation.Research is now showing that the MIND diet, which is a combination of The Mediterranean diet and the DASH diet, is beneficial for cognitive processing and longevity. Information about this diet can be found in The MIND Diet, a book by Doyne Keel, MS, RDN, and online at [employer]book[employer].si  Finances, Power of Attorney and Advance Directives: You should consider putting legal safeguards in  place with regard to financial and medical decision making. While the spouse always has power of attorney for medical and financial issues in the absence of any form, you should consider what you want in case the spouse / caregiver is no longer around or capable of making decisions.

## 2022-09-06 NOTE — Telephone Encounter (Signed)
medicare NPR sent to GI

## 2022-09-06 NOTE — Progress Notes (Signed)
[facility]  PATIENT: Jason [name] DOB: Sep 17, 1941  REQUESTING CLINICIAN: [name], MD HISTORY FROM: Patient and spouse  REASON FOR VISIT: Memory impairment    HISTORICAL  CHIEF COMPLAINT:  Chief Complaint  Patient presents with   Follow-up    RM 12 with spouse [name]  Pt is well, states he has been forgetting daily activities, people, places, names for a few yrs. Also having problem with comprehension per spouse     HISTORY OF PRESENT ILLNESS:  This is a 81 year old man with past medical history of hypertension, memory problem, depression who is presenting with memory problem. Patient reports issue with memory problem as sometimes he is forgetful about people's name, this is people that he met at church, he denies any issue with family members name or names of close friends.  Sometimes he has confusion like this week he thought today's exam was his follow-up with orthopedic surgeon which she is scheduled for Thursday instead of his neurological appointment.   Wife reports that she feels that he is not making coherent judgment, she gives the example that 1 day they were supposed to go to the mechanic and it was raining, poor vision.  Wife made her turn and he did not see her and keep going and he did not have his phone at that time.  Instead of stopping somewhere to call his wife, he kept driving and look at every Pension scheme manager on [address]. He had been looking for wife for almost 4 hours and at the end of the day he was parked at the [employer] which was closed at that time reading the newspaper. Wife said that this is not  him, because in the past he will stop somewhere get a cell phone and call her. Wife also reports that sometimes he will say that people on TV, specially people and Eusebio Friendly are watching him.    Wife reports difficulty understanding but this might be related to his hearing loss.  He was seen by audiology, recommended hearing aids but has  not gotten them due to price.   TBI:  No past history of TBI Stroke:   no past history of stroke Seizures:   no past history of seizures Sleep:   no history of sleep apnea Mood:   patient has depression Family history of Dementia: Sister with Alzheimer, diagnosed at 61, died at 75. Brother has history of brain bleed   Functional status: independent in some ADLs Patient lives with Spouse . Cooking: No  Cleaning: Yes  Shopping: Yes  Bathing: Yes  Toileting: Yes  Driving: Yes, no recent accident, denies getting lost in familiar places  Bills: Wife  Medications: He is not on Benzodiazepine or opioids  Ever left the stove on by accident?: No  Forget how to use items around the house?: No  Getting lost going to familiar places?: No  Forgetting loved ones names?: No  Word finding difficulty? Yes  Sleep: Good    OTHER MEDICAL CONDITIONS: Hypertension, Memory deficit, Depression   REVIEW OF SYSTEMS: Full 14 system review of systems performed and negative with exception of: As noted in the HPI   ALLERGIES: No Known Allergies  HOME MEDICATIONS: Outpatient Medications Prior to Visit  Medication Sig Dispense Refill   amLODipine (NORVASC) 5 MG tablet TAKE 1 TABLET BY MOUTH EVERY DAY 90 tablet 1   donepezil (ARICEPT) 10 MG tablet Take 1 tablet (10 mg total) by mouth at bedtime. 90 tablet 3  ferrous sulfate 325 (65 FE) MG tablet Take 325 mg by mouth daily with breakfast.     lithium carbonate 150 MG capsule Take 3 capsules (450 mg total) by mouth daily. 270 capsule 1   Methylfol-Algae-B12-Acetylcyst (CEREFOLIN NAC) 6-90.314-2-600 MG TABS TAKE 1 TABLET BY MOUTH EVERY DAY 90 tablet 3   Multiple Vitamins-Minerals (MULTIVITAMIN ADULTS) TABS Take 1 tablet by mouth daily.     sertraline (ZOLOFT) 100 MG tablet Take 2 tablets (200 mg total) by mouth daily. 180 tablet 1   simvastatin (ZOCOR) 20 MG tablet Take 1 tablet (20 mg total) by mouth daily. 90 tablet 3   Vitamin D, Ergocalciferol,  (DRISDOL) 1.25 MG (50000 UNIT) CAPS capsule TAKE 1 CAPSULE EVERY 7 DAYS 12 capsule 1   Acetylcysteine (NAC) 500 MG CAPS Take by mouth. (Patient not taking: Reported on 09/06/2022)     No facility-administered medications prior to visit.    PAST MEDICAL HISTORY: Past Medical History:  Diagnosis Date   Anxiety    Atrial fibrillation (HCC)    past hx   Colon polyp    Depression    GERD (gastroesophageal reflux disease)    Gout, unspecified    Hypertension    Osteoarthrosis, unspecified whether generalized or localized, unspecified site    Pneumonia, organism unspecified(486)    Pure hypercholesterolemia    Routine general medical examination at a health care facility    Sleep apnea    not using cpap currently   Unspecified nonpsychotic mental disorder     PAST SURGICAL HISTORY: Past Surgical History:  Procedure Laterality Date   COLONOSCOPY  10/04/2006   jacobs   TOTAL HIP ARTHROPLASTY Right 03/12/2022   Procedure: TOTAL HIP ARTHROPLASTY ANTERIOR APPROACH;  Surgeon: Rod Can, MD;  Location: [facility];  Service: Orthopedics;  Laterality: Right;   UPPER GASTROINTESTINAL ENDOSCOPY  2015   WISDOM TOOTH EXTRACTION      FAMILY HISTORY: Family History  Problem Relation Age of Onset   Dementia Mother    Pneumonia Father    Heart failure Father    Arthritis Other    Colon cancer Neg Hx    Colon polyps Neg Hx    Esophageal cancer Neg Hx    Stomach cancer Neg Hx    Rectal cancer Neg Hx     SOCIAL HISTORY: Social History   Socioeconomic History   Marital status: Married    Spouse name: [name]   Number of children: 0   Years of education: Not on file   Highest education level: Not on file  Occupational History   Occupation: retired    Fish farm manager: [employer]  Tobacco Use   Smoking status: Never   Smokeless tobacco: Never  Vaping Use   Vaping Use: Never used  Substance and Sexual Activity   Alcohol use: No   Drug use: No   Sexual activity: Not on file  Other Topics  Concern   Not on file  Social History Narrative   Not on file   Social Determinants of Health   Financial Resource Strain: Not on file  Food Insecurity: Not on file  Transportation Needs: Not on file  Physical Activity: Not on file  Stress: Not on file  Social Connections: Not on file  Intimate Partner Violence: Not on file    PHYSICAL EXAM  GENERAL EXAM/CONSTITUTIONAL: Vitals:  Vitals:   09/06/22 1305  BP: 115/73  Pulse: 67  Weight: 199 lb (90.3 kg)  Height: $Remove'6\' 2"'kCuKsVT$  (1.88 m)   Body mass index  is 25.55 kg/m. Wt Readings from Last 3 Encounters:  09/06/22 199 lb (90.3 kg)  04/19/22 201 lb 8 oz (91.4 kg)  03/12/22 200 lb 6.4 oz (90.9 kg)   Patient is in no distress; well developed, nourished and groomed; neck is supple   EYES: Pupils round and reactive to light, Visual fields full to confrontation, Extraocular movements intacts,   MUSCULOSKELETAL: Gait, strength, tone, movements noted in Neurologic exam below  NEUROLOGIC: MENTAL STATUS:     09/19/2018    1:50 PM 09/12/2017    1:34 PM  MMSE - Mini Mental State Exam  Orientation to time 3 5  Orientation to Place 5 5  Registration 3 3  Attention/ Calculation 5 5  Recall 2 3  Language- name 2 objects 2 2  Language- repeat 1 1  Language- follow 3 step command 3 3  Language- read & follow direction 1 1  Write a sentence 1 1  Copy design 1 1  Total score 27 30         09/06/2022    1:21 PM  Montreal Cognitive Assessment   Visuospatial/ Executive (0/5) 3  Naming (0/3) 3  Attention: Read list of digits (0/2) 2  Attention: Read list of letters (0/1) 1  Attention: Serial 7 subtraction starting at 100 (0/3) 3  Language: Repeat phrase (0/2) 1  Language : Fluency (0/1) 0  Abstraction (0/2) 2  Delayed Recall (0/5) 2  Orientation (0/6) 6  Total 23    CRANIAL NERVE:  2nd, 3rd, 4th, 6th - pupils equal and reactive to light, visual fields full to confrontation, extraocular muscles intact, no nystagmus 5th -  facial sensation symmetric 7th - facial strength symmetric 8th - hearing intact 9th - palate elevates symmetrically, uvula midline 11th - shoulder shrug symmetric 12th - tongue protrusion midline  MOTOR:  normal bulk and tone, full strength in the BUE, BLE  SENSORY:  normal and symmetric to light touch, vibration  COORDINATION:  finger-nose-finger, fine finger movements normal  REFLEXES:  deep tendon reflexes present and symmetric  GAIT/STATION:  normal   DIAGNOSTIC DATA (LABS, IMAGING, TESTING) - I reviewed patient records, labs, notes, testing and imaging myself where available.  Lab Results  Component Value Date   WBC 6.6 04/19/2022   HGB 10.4 (L) 04/19/2022   HCT 31.4 (L) 04/19/2022   MCV 98.0 04/19/2022   PLT 249.0 04/19/2022      Component Value Date/Time   NA 136 03/14/2022 0528   K 3.9 03/14/2022 0528   CL 107 03/14/2022 0528   CO2 24 03/14/2022 0528   GLUCOSE 124 (H) 03/14/2022 0528   BUN 21 03/14/2022 0528   CREATININE 0.86 03/14/2022 0528   CALCIUM 8.8 (L) 03/14/2022 0528   PROT 7.0 02/02/2022 0952   ALBUMIN 3.0 (L) 03/14/2022 0528   AST 23 02/02/2022 0952   ALT 17 02/02/2022 0952   ALKPHOS 72 02/02/2022 0952   BILITOT 0.6 02/02/2022 0952   GFRNONAA >60 03/14/2022 0528   GFRAA >60 04/19/2020 1247   Lab Results  Component Value Date   CHOL 169 02/02/2022   HDL 80.80 02/02/2022   LDLCALC 78 02/02/2022   TRIG 51.0 02/02/2022   CHOLHDL 2 02/02/2022   Lab Results  Component Value Date   HGBA1C 5.3 03/12/2022   Lab Results  Component Value Date   VITAMINB12 1,126 (H) 03/14/2022   Lab Results  Component Value Date   TSH 3.44 02/02/2022     ASSESSMENT AND PLAN  81 y.o.  year old male with history of hypertension, hyperlipidemia, memory deficit on Aricept who is presenting with concern of memory and judgment.  On exam today he scored a 23 out of 30 on the [place] within the past he has normal MMSE.  He is already on Aricept, I will send him  for a formal neuropsychological evaluation and also obtain a Brain MRI. I will see him for follow up in a year     1. Mild cognitive disorder      Patient Instructions  Continue current medications  MRI Brain without contrast  Referral for formal neuropsychiatry testing  Return in a year or sooner if worse    There are well-accepted and sensible ways to reduce risk for Alzheimers disease and other degenerative brain disorders .  Exercise Daily Walk A daily 20 minute walk should be part of your routine. Disease related apathy can be a significant roadblock to exercise and the only way to overcome this is to make it a daily routine and perhaps have a reward at the end (something your loved one loves to eat or drink perhaps) or a personal trainer coming to the home can also be very useful. Most importantly, the patient is much more likely to exercise if the caregiver / spouse does it with him/her. In general a structured, repetitive schedule is best.  General Health: Any diseases which effect your body will effect your brain such as a pneumonia, urinary infection, blood clot, heart attack or stroke. Keep contact with your primary care doctor for regular follow ups.  Sleep. A good nights sleep is healthy for the brain. Seven hours is recommended. If you have insomnia or poor sleep habits we can give you some instructions. If you have sleep apnea wear your mask.  Diet: Eating a heart healthy diet is also a good idea; fish and poultry instead of red meat, nuts (mostly non-peanuts), vegetables, fruits, olive oil or canola oil (instead of butter), minimal salt (use other spices to flavor foods), whole grain rice, bread, cereal and pasta and wine in moderation.Research is now showing that the MIND diet, which is a combination of The Mediterranean diet and the DASH diet, is beneficial for cognitive processing and longevity. Information about this diet can be found in The MIND Diet, a book by Doyne Keel, MS, RDN, and online at [employer]book[employer].si  Finances, Power of Attorney and Advance Directives: You should consider putting legal safeguards in place with regard to financial and medical decision making. While the spouse always has power of attorney for medical and financial issues in the absence of any form, you should consider what you want in case the spouse / caregiver is no longer around or capable of making decisions.   Orders Placed This Encounter  Procedures   MR BRAIN WO CONTRAST   Ambulatory referral to Neuropsychology    No orders of the defined types were placed in this encounter.   Return in about 1 year (around 09/07/2023).  I have spent a total of 65 minutes dedicated to this patient today, preparing to see patient, performing a medically appropriate examination and evaluation, ordering tests and/or medications and procedures, and counseling and educating the patient/family/caregiver; independently interpreting result and communicating results to the family/patient/caregiver; and documenting clinical information in the electronic medical record.   Alric Ran, MD 09/06/2022, 6:33 PM  [facility] [address], [place], [place] [address] [phone]

## 2022-09-16 DIAGNOSIS — H524 Presbyopia: Secondary | ICD-10-CM | POA: Diagnosis not present

## 2022-09-16 DIAGNOSIS — H2513 Age-related nuclear cataract, bilateral: Secondary | ICD-10-CM | POA: Diagnosis not present

## 2022-09-20 DIAGNOSIS — S72031D Displaced midcervical fracture of right femur, subsequent encounter for closed fracture with routine healing: Secondary | ICD-10-CM | POA: Diagnosis not present

## 2022-09-21 ENCOUNTER — Ambulatory Visit
Admission: RE | Admit: 2022-09-21 | Discharge: 2022-09-21 | Disposition: A | Discharge status: Home / Self Care | Payer: Medicare Other | Source: Ambulatory Visit | Attending: Neurology | Admitting: Neurology

## 2022-09-21 DIAGNOSIS — F09 Unspecified mental disorder due to known physiological condition: Secondary | ICD-10-CM | POA: Diagnosis not present

## 2022-10-21 ENCOUNTER — Telehealth (INDEPENDENT_AMBULATORY_CARE_PROVIDER_SITE_OTHER): Payer: Medicare Other | Admitting: Family Medicine

## 2022-10-21 ENCOUNTER — Encounter: Payer: Self-pay | Admitting: Family Medicine

## 2022-10-21 VITALS — Ht 74.0 in | Wt 199.0 lb

## 2022-10-21 DIAGNOSIS — U071 COVID-19: Secondary | ICD-10-CM

## 2022-10-21 DIAGNOSIS — R519 Headache, unspecified: Secondary | ICD-10-CM

## 2022-10-21 DIAGNOSIS — R0981 Nasal congestion: Secondary | ICD-10-CM

## 2022-10-21 LAB — POCT INFLUENZA A/B
Influenza A, POC: NEGATIVE
Influenza B, POC: NEGATIVE

## 2022-10-21 LAB — POC COVID19 BINAXNOW: SARS Coronavirus 2 Ag: POSITIVE — AB

## 2022-10-21 NOTE — Progress Notes (Signed)
Patient ID: Jason [name], male   DOB: May 23, 1941, 81 y.o.   MRN: [id number]   Virtual Visit via Telephone Note  I connected with [name] on 10/21/22 at 10:00 AM EDT by telephone and verified that I am speaking with the correct person using two identifiers.   I discussed the limitations, risks, security and privacy concerns of performing an evaluation and management service by telephone and the availability of in person appointments. I also discussed with the patient that there may be a patient responsible charge related to this service. The patient expressed understanding and agreed to proceed.  Location patient: home Location provider: work or home office Participants present for the call: patient, provider Patient did not have a visit in the prior 7 days to address this/these issue(s).   History of Present Illness: Jason [name] has COVID infection by test earlier today.  He actually developed some increased nasal congestion last weekend.  He thought this was allergies and has been taking antihistamine with Zyrtec and Flonase.  He did a home test Wednesday which came back negative but then tested positive here in the office today.  He had some cough but relatively mild.  No fever.  No dyspnea.  He has some initial headaches but these are actually improved at this time.  has also been taking some Tylenol.  Temperature earlier today 98.3.  Jason [name] is asymptomatic  Past Medical History:  Diagnosis Date   Anxiety    Atrial fibrillation (HCC)    past hx   Colon polyp    Depression    GERD (gastroesophageal reflux disease)    Gout, unspecified    Hypertension    Osteoarthrosis, unspecified whether generalized or localized, unspecified site    Pneumonia, organism unspecified(486)    Pure hypercholesterolemia    Routine general medical examination at a health care facility    Sleep apnea    not using cpap currently   Unspecified nonpsychotic mental disorder    Past Surgical History:  Procedure  Laterality Date   COLONOSCOPY  10/04/2006   jacobs   TOTAL HIP ARTHROPLASTY Right 03/12/2022   Procedure: TOTAL HIP ARTHROPLASTY ANTERIOR APPROACH;  Surgeon: Rod Can, MD;  Location: [facility];  Service: Orthopedics;  Laterality: Right;   UPPER GASTROINTESTINAL ENDOSCOPY  2015   WISDOM TOOTH EXTRACTION      reports that he has never smoked. He has never used smokeless tobacco. He reports that he does not drink alcohol and does not use drugs. family history includes Arthritis in an other family member; Dementia in his mother; Heart failure in his father; Pneumonia in his father. No Known Allergies    Observations/Objective: Patient sounds cheerful and well on the phone. I do not appreciate any SOB. Speech and thought processing are grossly intact. Patient reported vitals:  Assessment and Plan:  COVID infection.  Patient is on day probably 6 or so at this time and would not recommend antivirals and is also improving.  -Continue plenty of fluids and rest -Can continue over-the-counter medications as needed -Follow-up immediately for increased dyspnea or other concerns -Reviewed isolation recommendations  Follow Up Instructions:    [id number] 5-10 [id number] 11-20 [id number] 21-30 I did not refer this patient for an OV in the next 24 hours for this/these issue(s).  I discussed the assessment and treatment plan with the patient. The patient was provided an opportunity to ask questions and all were answered. The patient agreed with the plan and demonstrated an understanding of the instructions.  The patient was advised to call back or seek an in-person evaluation if the symptoms worsen or if the condition fails to improve as anticipated.  I provided 21 minutes of non-face-to-face time during this encounter.   Carolann Littler, MD

## 2022-11-03 DIAGNOSIS — Z23 Encounter for immunization: Secondary | ICD-10-CM | POA: Diagnosis not present

## 2022-11-11 ENCOUNTER — Other Ambulatory Visit: Payer: Self-pay | Admitting: Family Medicine

## 2022-12-02 ENCOUNTER — Telehealth: Payer: Self-pay | Admitting: Family Medicine

## 2022-12-02 NOTE — Telephone Encounter (Signed)
Left message for patient to call back and schedule Medicare Annual Wellness Visit (AWV) either virtually or in office. Left  my Herbie Drape number [phone]   Last AWV 09/19/18  please schedule with Nurse Health Adviser   45 min for awv-i and in office appointments 30 min for awv-s  phone/virtual appointments

## 2022-12-06 ENCOUNTER — Ambulatory Visit (INDEPENDENT_AMBULATORY_CARE_PROVIDER_SITE_OTHER): Payer: Medicare Other | Admitting: Psychiatry

## 2022-12-06 ENCOUNTER — Encounter: Payer: Self-pay | Admitting: Psychiatry

## 2022-12-06 DIAGNOSIS — G3184 Mild cognitive impairment, so stated: Secondary | ICD-10-CM

## 2022-12-06 DIAGNOSIS — F411 Generalized anxiety disorder: Secondary | ICD-10-CM

## 2022-12-06 DIAGNOSIS — F3342 Major depressive disorder, recurrent, in full remission: Secondary | ICD-10-CM

## 2022-12-06 DIAGNOSIS — R7989 Other specified abnormal findings of blood chemistry: Secondary | ICD-10-CM | POA: Diagnosis not present

## 2022-12-06 DIAGNOSIS — F422 Mixed obsessional thoughts and acts: Secondary | ICD-10-CM

## 2022-12-06 MED ORDER — LITHIUM CARBONATE 150 MG PO CAPS: 450.0000 mg | ORAL_CAPSULE | Freq: Every day | ORAL | 3 refills | Status: DC

## 2022-12-06 MED ORDER — SERTRALINE HCL 100 MG PO TABS: 200.0000 mg | ORAL_TABLET | Freq: Every day | ORAL | 3 refills | Status: DC

## 2022-12-06 NOTE — Progress Notes (Signed)
Jason [name] [id number] Oct 27, 1941 81 y.o.  Subjective:   Patient ID:  Jason [name] is a 81 y.o. (DOB Jul 26, 1941) male.  Chief Complaint:  Chief Complaint  Patient presents with   Follow-up   Depression   Anxiety    Depression        Associated symptoms include no decreased concentration and no suicidal ideas.  Past medical history includes anxiety.   Anxiety Symptoms include nervous/anxious behavior. Patient reports no chest pain, confusion, decreased concentration, palpitations or suicidal ideas.       Jason [name] presents to the office today for follow-up of MDE, OCD and GAD.  seen August, 2020.  No meds were changed.  5/5//21  Seen with wife Jason [name]. Had mild Covid in fall 2020 and got the vaccine.   CO forgetfulness with names and places but not getting words out correctly.   Wife notices the same but she's not overly concerned but she has noticed occ gets confused about people. PCP checked b12 normal bc pt brought up memory concern.   11/16/20 appt with following noted:  Seen with wife Jason [name] Still doing well. Occ spellls of anxiety randomly.  Does better if doesn't forget meds. No SE.  Memory trouble limited mainly to names.  Not much worse.  OK otherwise. Depression can occur but only last a day.  Depression and anxiety under control.  Except notices Feb each year a change in disposition but it's minor.  Patient reports stable mood and denies depressed or irritable moods.   Patient denies difficulty with sleep initiation or maintenance but it is split with some napping.  EMA. Denies appetite disturbance.  Patient reports that energy and motivation have been good.  Patient denies any difficulty with concentration.  Patient denies any suicidal ideation. Stress caring for inlaws in their 90s.  F good cognition.  M cognitive problems. Plan: Patient's lithium level was 0.51-year ago on 600 mg daily.  Level on 11/28/2020 was 0.9 on 600 mg daily.  The lithium level has crept up a  little bit.  I am concerned it may start causing side effects.  Therefore a prescription has been sent so that he can reduce the dose to 450 mg daily by using 3 of the 150 mg capsules.  06/10/2021 appointment with the following noted: seen with wife Doing fine and no problems with reduction in lithium to 450 mg daily. Frustration and down with world and politics and can interfere with sleep lately better. Staying up later and more irregular pattern.  Will nap at times. Questions about meds Plan no changes  12/09/21 appt noted: seen with wife Sertraline 200 mg daily and lithium 450 mg and donepezil 10 mg HS, Cerefolin NAC Consistent with meds.  No SE Doing well for 81 yo. Patient reports stable mood and denies depressed or irritable moods.  Patient denies any recent difficulty with anxiety.  Patient denies difficulty with sleep initiation or maintenance. Denies appetite disturbance.  Patient reports that energy and motivation have been good.  Patient denies any difficulty with concentration.  Patient denies any suicidal ideation. Helping homeless families. Plan: High relapse risk if reduce meds.  But rarely high dose sertraline can interfere with cognition but he's at risk with reduction. No med changes. Continue lithium 450 mg daily Sertraline 200 Aricept 10  06/06/22 appt noted: seen with wife per usual Doing fine except fell off porch and hip fx with replacement since here. Recovery has gone well.  After surgery in March.  Finished PT at home and now some outpt PT. Jason [name].  Pleased with him. Not depressed.  Patient reports stable mood and denies depressed or irritable moods.  Patient denies any recent difficulty with anxiety.  Patient denies difficulty with sleep initiation or maintenance. Denies appetite disturbance.  Patient reports that energy and motivation have been good.  Patient has mild forgetfulness. Patient denies any suicidal ideation. No SE problems.  12/06/22 appt  noted:  seen with wife Good I most respects. Still some memory issues and tries to work on it and pay attention better. Not losing stuff.   Saw Jason [name] dx MCI  MMSE 23/30.  Plans neuropsych testing in Feb [place].  Also sched MRI 09/21/22 mild mod atrophy Satisfied with meds.   Not dep. Momentary anxiety but not persistent.  No panic. Sleep is best it's been at the moment.  More effort to improve it.  Was going to sleep too late.   No problems with meds.  Past Psychiatric Medication Trials: Wellbutrin side effects, Lexapro nausea,  paroxetine, fluoxetine briefly, duloxetine, , Effexor with a history of benefit, mirtazapine, Pristiq 100, sertraline 200,  Abilify,  buspirone side effects, lithium 600, olanzapine 2.5 with benefit. ECT  Review of Systems:  Review of Systems  HENT:  Positive for hearing loss.   Respiratory:  Negative for chest tightness.   Cardiovascular:  Negative for chest pain and palpitations.  Musculoskeletal:  Positive for arthralgias and back pain.  Neurological:  Negative for tremors and weakness.  Psychiatric/Behavioral:  Negative for agitation, behavioral problems, confusion, decreased concentration, dysphoric mood, hallucinations, self-injury, sleep disturbance and suicidal ideas. The patient is nervous/anxious. The patient is not hyperactive.     Medications: I have reviewed the patient's current medications.  Current Outpatient Medications  Medication Sig Dispense Refill   amLODipine (NORVASC) 5 MG tablet TAKE 1 TABLET BY MOUTH EVERY DAY 90 tablet 1   donepezil (ARICEPT) 10 MG tablet Take 1 tablet (10 mg total) by mouth at bedtime. 90 tablet 3   ferrous sulfate 325 (65 FE) MG tablet Take 325 mg by mouth daily with breakfast.     Methylfol-Algae-B12-Acetylcyst (CEREFOLIN NAC) 6-90.314-2-600 MG TABS TAKE 1 TABLET BY MOUTH EVERY DAY 90 tablet 3   Multiple Vitamins-Minerals (MULTIVITAMIN ADULTS) TABS Take 1 tablet by mouth  daily.     simvastatin (ZOCOR) 20 MG tablet Take 1 tablet (20 mg total) by mouth daily. 90 tablet 3   Vitamin D, Ergocalciferol, (DRISDOL) 1.25 MG (50000 UNIT) CAPS capsule TAKE 1 CAPSULE BY MOUTH EVERY 7 DAYS 12 capsule 0   lithium carbonate 150 MG capsule Take 3 capsules (450 mg total) by mouth daily. 270 capsule 3   sertraline (ZOLOFT) 100 MG tablet Take 2 tablets (200 mg total) by mouth daily. 180 tablet 3   No current facility-administered medications for this visit.    Medication Side Effects: None except occ jerks from lithium  Allergies: No Known Allergies  Past Medical History:  Diagnosis Date   Anxiety    Atrial fibrillation (HCC)    past hx   Colon polyp    Depression    GERD (gastroesophageal reflux disease)    Gout, unspecified    Hypertension    Osteoarthrosis, unspecified whether generalized or localized, unspecified site    Pneumonia, organism unspecified(486)    Pure hypercholesterolemia    Routine general medical examination at a health care facility    Sleep apnea    not using cpap  currently   Unspecified nonpsychotic mental disorder     Family History  Problem Relation Age of Onset   Dementia Mother    Pneumonia Father    Heart failure Father    Arthritis Other    Colon cancer Neg Hx    Colon polyps Neg Hx    Esophageal cancer Neg Hx    Stomach cancer Neg Hx    Rectal cancer Neg Hx     Social History   Socioeconomic History   Marital status: Married    Spouse name: Jason [name]   Number of children: 0   Years of education: Not on file   Highest education level: Not on file  Occupational History   Occupation: retired    Fish farm manager: [employer]  Tobacco Use   Smoking status: Never   Smokeless tobacco: Never  Vaping Use   Vaping Use: Never used  Substance and Sexual Activity   Alcohol use: No   Drug use: No   Sexual activity: Not on file  Other Topics Concern   Not on file  Social History Narrative   Not on file   Social Determinants of  Health   Financial Resource Strain: Not on file  Food Insecurity: Not on file  Transportation Needs: Not on file  Physical Activity: Not on file  Stress: Not on file  Social Connections: Not on file  Intimate Partner Violence: Not on file    Past Medical History, Surgical history, Social history, and Family history were reviewed and updated as appropriate.   Please see review of systems for further details on the patient's review from today.   Objective:   Physical Exam:  There were no vitals taken for this visit.  Physical Exam Constitutional:      General: He is not in acute distress.    Appearance: He is well-developed.  Musculoskeletal:        General: No deformity.  Neurological:     Mental Status: He is alert and oriented to person, place, and time.     Motor: No tremor.     Coordination: Coordination normal.  Psychiatric:        Attention and Perception: Attention normal. He is attentive. He does not perceive auditory hallucinations.        Mood and Affect: Mood normal. Mood is not anxious or depressed. Affect is not labile, blunt, angry, tearful or inappropriate.        Speech: Speech normal. Speech is not slurred.        Behavior: Behavior normal. Behavior is not slowed.        Thought Content: Thought content normal. Thought content is not delusional. Thought content does not include homicidal or suicidal ideation. Thought content does not include suicidal plan.        Cognition and Memory: Cognition normal. He exhibits impaired recent memory.        Judgment: Judgment normal.     Comments: Insight is fair to good. Positive affect     Lab Review:     Component Value Date/Time   NA 136 03/14/2022 0528   K 3.9 03/14/2022 0528   CL 107 03/14/2022 0528   CO2 24 03/14/2022 0528   GLUCOSE 124 (H) 03/14/2022 0528   BUN 21 03/14/2022 0528   CREATININE 0.86 03/14/2022 0528   CALCIUM 8.8 (L) 03/14/2022 0528   PROT 7.0 02/02/2022 0952   ALBUMIN 3.0 (L) 03/14/2022  0528   AST 23 02/02/2022 0952   ALT 17 02/02/2022 5427  ALKPHOS 72 02/02/2022 0952   BILITOT 0.6 02/02/2022 0952   GFRNONAA >60 03/14/2022 0528   GFRAA >60 04/19/2020 1247       Component Value Date/Time   WBC 6.6 04/19/2022 1511   RBC 3.21 (L) 04/19/2022 1511   HGB 10.4 (L) 04/19/2022 1511   HCT 31.4 (L) 04/19/2022 1511   PLT 249.0 04/19/2022 1511   MCV 98.0 04/19/2022 1511   MCH 32.6 03/15/2022 0454   MCHC 33.0 04/19/2022 1511   RDW 15.0 04/19/2022 1511   LYMPHSABS 1.6 04/19/2022 1511   MONOABS 0.6 04/19/2022 1511   EOSABS 0.2 04/19/2022 1511   BASOSABS 0.1 04/19/2022 1511   Normal TSH in September.  Last lithium 0.8 in July 2019  Lithium level January 10, 2027 0.5  Lithium Lvl  Date Value Ref Range Status  01/04/2022 0.6 0.6 - 1.2 mmol/L Final   01/04/22 lithium level 0.6 on 450 mg daily.  No results found for: "PHENYTOIN", "PHENOBARB", "VALPROATE", "CBMZ"   .res Assessment: Plan:    Recurrent major depression in complete remission ([place]) - Plan: Lithium level, lithium carbonate 150 MG capsule, sertraline (ZOLOFT) 100 MG tablet  Mixed obsessional thoughts and acts - Plan: sertraline (ZOLOFT) 100 MG tablet  Generalized anxiety disorder - Plan: sertraline (ZOLOFT) 100 MG tablet  Mild cognitive impairment  Low vitamin D level   Mr. Maxim has a long history of the above diagnoses which have been treatment resistant at times and multiple relapses.  He ultimately responded to the combination of sertraline and lithium and Zyprexa.  He has been able to wean off the Zyprexa without complication.  He's continuing to do well.  As noted multiple failed meds and a history of treatment resistance. Each diagnosis under control and meds helping.   Extensive disucussion of sleep hygiene.  Also has nocturia.  Agree with pursuing hearing aids given cognitive concerns.  Then consider neuro work up.  Supportive therapy dealing with sick in-laws and caretaking.  Encourage  self-care and recreation.  Also dealing with homeless people.  Counseled patient regarding potential benefits, risks, and side effects of lithium to include potential risk of lithium affecting thyroid and renal function.  Discussed need for periodic lab monitoring to determine drug level and to assess for potential adverse effects.  Counseled patient regarding signs and symptoms of lithium toxicity and advised that they notify office immediately or seek urgent medical attention if experiencing these signs and symptoms.  Patient advised to contact office with any questions or concerns.  Call if any changes in BP meds.  Disc DDI  Lithium level is in the low normal range and is typical for the levels he has had over the last couple of years.  Labs good.  Disc risk s and TSH also normal. Has normal D on 50K weekly. and B12 levels Reduced lithium from 600 to 450 Dec 2021 bc Cr creep without problems. 03/14/22 Cr. 0.8. Continue lithium to AM to see if nocturia is helped.  01/04/22 lithium level 0.6 on 450 mg daily.  Disc the difference between Cerefolin NAC and Metafolbic plus angain in detail.Marland Kitchen option switch to NAC & B complex  Disc donepezil 10 mg nightly.  Does not have Alzheimer's but is bothered enough with forgetfulness to warrant the benefit.  Good grasp of politics.   Neuro workup normal and pending neuropsych testing. Gave article Dr. Olene Floss article before on lithium.  Answered questions about OTC memory meds.  High relapse risk if reduce meds.  But rarely high  dose sertraline can interfere with cognition but he's at risk with reduction. No med changes. Continue lithium 450 mg daily Sertraline 200.  Option reduce Aricept 10 Continue Vitamin D with level in 50s Check lithium level This appt was 30 mins.  FU 6 mos  Lynder Parents, MD, DFAPA     Please see After Visit Summary for patient specific instructions.  Future Appointments  Date Time Provider [place]  09/07/2023  1:45  PM Alric Ran, MD [facility] None     Orders Placed This Encounter  Procedures   Lithium level      -------------------------------

## 2023-01-10 ENCOUNTER — Other Ambulatory Visit: Payer: Self-pay | Admitting: Family Medicine

## 2023-01-15 ENCOUNTER — Other Ambulatory Visit: Payer: Self-pay | Admitting: Family Medicine

## 2023-01-31 DIAGNOSIS — F3342 Major depressive disorder, recurrent, in full remission: Secondary | ICD-10-CM | POA: Diagnosis not present

## 2023-02-01 ENCOUNTER — Emergency Department (HOSPITAL_BASED_OUTPATIENT_CLINIC_OR_DEPARTMENT_OTHER)
Admission: EM | Admit: 2023-02-01 | Discharge: 2023-02-02 | Disposition: A | Discharge status: Home / Self Care | Payer: Medicare Other | Attending: Emergency Medicine | Admitting: Emergency Medicine

## 2023-02-01 ENCOUNTER — Other Ambulatory Visit: Payer: Self-pay

## 2023-02-01 ENCOUNTER — Encounter (HOSPITAL_BASED_OUTPATIENT_CLINIC_OR_DEPARTMENT_OTHER): Payer: Self-pay | Admitting: Emergency Medicine

## 2023-02-01 DIAGNOSIS — R2689 Other abnormalities of gait and mobility: Secondary | ICD-10-CM | POA: Diagnosis not present

## 2023-02-01 DIAGNOSIS — I10 Essential (primary) hypertension: Secondary | ICD-10-CM | POA: Insufficient documentation

## 2023-02-01 DIAGNOSIS — R42 Dizziness and giddiness: Secondary | ICD-10-CM | POA: Diagnosis not present

## 2023-02-01 DIAGNOSIS — Z96641 Presence of right artificial hip joint: Secondary | ICD-10-CM | POA: Insufficient documentation

## 2023-02-01 DIAGNOSIS — U071 COVID-19: Secondary | ICD-10-CM | POA: Diagnosis not present

## 2023-02-01 DIAGNOSIS — W19XXXA Unspecified fall, initial encounter: Secondary | ICD-10-CM | POA: Insufficient documentation

## 2023-02-01 DIAGNOSIS — R5383 Other fatigue: Secondary | ICD-10-CM | POA: Diagnosis present

## 2023-02-01 LAB — LITHIUM LEVEL: Lithium Lvl: 0.6 mmol/L (ref 0.6–1.2)

## 2023-02-01 NOTE — ED Triage Notes (Signed)
  Patient comes in with generalized body aches and cold symptoms that have been going on for 3-4 days.  Wife states he has had increased difficulty ambulating due to weakness.  Patient had to be assisted to bathroom and back into bed.  Normally has no issues ambulating.  Did not fall and hit his head.  No blood thinners.  States no pain but feels achy.

## 2023-02-02 ENCOUNTER — Emergency Department (HOSPITAL_BASED_OUTPATIENT_CLINIC_OR_DEPARTMENT_OTHER): Payer: Medicare Other

## 2023-02-02 DIAGNOSIS — U071 COVID-19: Secondary | ICD-10-CM | POA: Diagnosis not present

## 2023-02-02 DIAGNOSIS — R531 Weakness: Secondary | ICD-10-CM | POA: Diagnosis not present

## 2023-02-02 LAB — CBC
HCT: 37.6 % — ABNORMAL LOW (ref 39.0–52.0)
Hemoglobin: 12.7 g/dL — ABNORMAL LOW (ref 13.0–17.0)
MCH: 32.1 pg (ref 26.0–34.0)
MCHC: 33.8 g/dL (ref 30.0–36.0)
MCV: 94.9 fL (ref 80.0–100.0)
Platelets: 173 10*3/uL (ref 150–400)
RBC: 3.96 MIL/uL — ABNORMAL LOW (ref 4.22–5.81)
RDW: 13.3 % (ref 11.5–15.5)
WBC: 6.9 10*3/uL (ref 4.0–10.5)
nRBC: 0 % (ref 0.0–0.2)

## 2023-02-02 LAB — URINALYSIS, ROUTINE W REFLEX MICROSCOPIC
Bacteria, UA: NONE SEEN
Bilirubin Urine: NEGATIVE
Glucose, UA: NEGATIVE mg/dL
Ketones, ur: NEGATIVE mg/dL
Leukocytes,Ua: NEGATIVE
Nitrite: NEGATIVE
Specific Gravity, Urine: 1.024 (ref 1.005–1.030)
pH: 5.5 (ref 5.0–8.0)

## 2023-02-02 LAB — COMPREHENSIVE METABOLIC PANEL
ALT: 16 U/L (ref 0–44)
AST: 21 U/L (ref 15–41)
Albumin: 4.3 g/dL (ref 3.5–5.0)
Alkaline Phosphatase: 46 U/L (ref 38–126)
Anion gap: 9 (ref 5–15)
BUN: 16 mg/dL (ref 8–23)
CO2: 23 mmol/L (ref 22–32)
Calcium: 9.9 mg/dL (ref 8.9–10.3)
Chloride: 103 mmol/L (ref 98–111)
Creatinine, Ser: 0.92 mg/dL (ref 0.61–1.24)
GFR, Estimated: >60 mL/min (ref >60)
Glucose, Bld: 112 mg/dL — ABNORMAL HIGH (ref 70–99)
Potassium: 3.7 mmol/L (ref 3.5–5.1)
Sodium: 135 mmol/L (ref 135–145)
Total Bilirubin: 0.5 mg/dL (ref 0.3–1.2)
Total Protein: 6.7 g/dL (ref 6.5–8.1)

## 2023-02-02 LAB — RESP PANEL BY RT-PCR (RSV, FLU A&B, COVID)  RVPGX2
Influenza A by PCR: NEGATIVE
Influenza B by PCR: NEGATIVE
Resp Syncytial Virus by PCR: NEGATIVE
SARS Coronavirus 2 by RT PCR: POSITIVE — AB

## 2023-02-02 MED ORDER — SODIUM CHLORIDE 0.9 % IV BOLUS
1000.0000 mL | Freq: Once | INTRAVENOUS | Status: AC
  Administered 2023-02-02: 1000 mL via INTRAVENOUS

## 2023-02-02 NOTE — ED Provider Notes (Signed)
[facility] Emergency Department Provider Note MRN:  [id number]  Arrival date & time: 02/02/23     Chief Complaint   Generalized Body Aches and Fall   History of Present Illness   Jason [name] is a 82 y.o. year-old male with a history of A-fib presenting to the ED with chief complaint of bodyaches, balance issue  Body aches and malaise and fatigue for the past few days.  Issues with balance today and yesterday.  Had a fall today, denies injury.  Sometimes feels a bit lightheaded when standing up.  Review of Systems  A thorough review of systems was obtained and all systems are negative except as noted in the HPI and PMH.   Patient's Health History    Past Medical History:  Diagnosis Date   Anxiety    Atrial fibrillation (HCC)    past hx   Colon polyp    Depression    GERD (gastroesophageal reflux disease)    Gout, unspecified    Hypertension    Osteoarthrosis, unspecified whether generalized or localized, unspecified site    Pneumonia, organism unspecified(486)    Pure hypercholesterolemia    Routine general medical examination at a health care facility    Sleep apnea    not using cpap currently   Unspecified nonpsychotic mental disorder     Past Surgical History:  Procedure Laterality Date   COLONOSCOPY  10/04/2006   jacobs   TOTAL HIP ARTHROPLASTY Right 03/12/2022   Procedure: TOTAL HIP ARTHROPLASTY ANTERIOR APPROACH;  Surgeon: Rod Can, MD;  Location: [facility];  Service: Orthopedics;  Laterality: Right;   UPPER GASTROINTESTINAL ENDOSCOPY  2015   WISDOM TOOTH EXTRACTION      Family History  Problem Relation Age of Onset   Dementia Mother    Pneumonia Father    Heart failure Father    Arthritis Other    Colon cancer Neg Hx    Colon polyps Neg Hx    Esophageal cancer Neg Hx    Stomach cancer Neg Hx    Rectal cancer Neg Hx     Social History   Socioeconomic History   Marital status: Married    Spouse name: [name]   Number of  children: 0   Years of education: Not on file   Highest education level: Not on file  Occupational History   Occupation: retired    Fish farm manager: [employer]  Tobacco Use   Smoking status: Never   Smokeless tobacco: Never  Vaping Use   Vaping Use: Never used  Substance and Sexual Activity   Alcohol use: No   Drug use: No   Sexual activity: Not on file  Other Topics Concern   Not on file  Social History Narrative   Not on file   Social Determinants of Health   Financial Resource Strain: Not on file  Food Insecurity: Not on file  Transportation Needs: Not on file  Physical Activity: Not on file  Stress: Not on file  Social Connections: Not on file  Intimate Partner Violence: Not on file     Physical Exam   Vitals:   02/02/23 0023 02/02/23 0200  BP: 124/63 138/69  Pulse: 75 76  Resp: 18   Temp:    SpO2: 97% 98%    CONSTITUTIONAL: Chronically ill-appearing, NAD NEURO/PSYCH:  Alert and oriented x 3, normal and symmetric strength and sensation, normal coordination, normal speech, unstable, possibly ataxic gait EYES:  eyes equal and reactive ENT/NECK:  no LAD, no JVD  CARDIO: Regular rate, well-perfused, normal S1 and S2 PULM:  CTAB no wheezing or rhonchi GI/GU:  non-distended, non-tender MSK/SPINE:  No gross deformities, no edema SKIN:  no rash, atraumatic   *Additional and/or pertinent findings included in MDM below  Diagnostic and Interventional Summary    EKG Interpretation  Date/Time:  Thursday February 02 2023 02:28:39 EST Ventricular Rate:  65 PR Interval:  194 QRS Duration: 96 QT Interval:  462 QTC Calculation: 481 R Axis:   16 Text Interpretation: Sinus rhythm Low voltage, precordial leads Borderline abnrm T, anterolateral leads Borderline prolonged QT interval Confirmed by Gerlene Fee [phone]) on 02/02/2023 2:50:38 AM       Labs Reviewed  RESP PANEL BY RT-PCR (RSV, FLU A&B, COVID)  RVPGX2 - Abnormal; Notable for the following components:      Result  Value   SARS Coronavirus 2 by RT PCR POSITIVE (*)    All other components within normal limits  CBC - Abnormal; Notable for the following components:   RBC 3.96 (*)    Hemoglobin 12.7 (*)    HCT 37.6 (*)    All other components within normal limits  COMPREHENSIVE METABOLIC PANEL - Abnormal; Notable for the following components:   Glucose, Bld 112 (*)    All other components within normal limits  URINALYSIS, ROUTINE W REFLEX MICROSCOPIC - Abnormal; Notable for the following components:   Hgb urine dipstick SMALL (*)    Protein, ur TRACE (*)    All other components within normal limits    CT HEAD WO CONTRAST (5MM)  Final Result      Medications  sodium chloride 0.9 % bolus 1,000 mL (1,000 mLs Intravenous New Bag/Given 02/02/23 0128)     Procedures  /  Critical Care Procedures  ED Course and Medical Decision Making  Initial Impression and Ddx Suspect dehydration and weakness in the setting of COVID-19.  Has tested positive in triage.  However his gait is possibly ataxic.  History of A-fib, not on anticoagulation.  Stroke is also considered.  Awaiting labs, CT head, will reassess after fluids.  Past medical/surgical history that increases complexity of ED encounter: Documented history of A-fib however this may not be accurate.  Patient is not anticoagulated, had an episode of palpitations and tachycardia after exercise several years ago but has never been told by a doctor that he has A-fib.  Interpretation of Diagnostics I personally reviewed the EKG and my interpretation is as follows: Sinus rhythm without concerning features  Labs reassuring with no significant blood count or electrolyte disturbance.  CT head without acute findings.  Patient Reassessment and Ultimate Disposition/Management     Patient's ambulation is improved after fluids per wife.  Still a bit unsteady, suspect related to COVID.  The other consideration is ataxia due to a small stroke.  He does not exhibit any  focal neurological deficits.  He only stumbles backwards a bit when he first stands from a seated position.  His gait is then normal once he gets started.  Advised close PCP follow-up if the balance issues continued.  No indication for emergent MRI at this time.  Appropriate for discharge.  Patient management required discussion with the following services or consulting groups:  None  Complexity of Problems Addressed Acute illness or injury that poses threat of life of bodily function  Additional Data Reviewed and Analyzed Further history obtained from: Further history from spouse/family member  Additional Factors Impacting ED Encounter Risk None  Barth Kirks. [place]onia Small, [place]  Emergency [place] [email or web address]  Final Clinical Impressions(s) / ED Diagnoses     ICD-10-CM   1. Balance problem  R26.89     2. COVID  U07.1       ED Discharge Orders     None        Discharge Instructions Discussed with and Provided to Patient:     Discharge Instructions      You were evaluated in the Emergency Department and after careful evaluation, we did not find any emergent condition requiring admission or further testing in the hospital.  Your exam/testing today is overall reassuring.  Symptoms may be due to COVID.  Recommend taking your time transitioning from sitting to standing.  Plenty of fluids and rest.  Recommend follow-up with your primary care doctor if your balance issues continue.  Please return to the Emergency Department if you experience any worsening of your condition.   Thank you for allowing [place] to be a part of your care.       Maudie Flakes, MD 02/02/23 [phone]

## 2023-02-02 NOTE — Discharge Instructions (Signed)
You were evaluated in the Emergency Department and after careful evaluation, we did not find any emergent condition requiring admission or further testing in the hospital.  Your exam/testing today is overall reassuring.  Symptoms may be due to COVID.  Recommend taking your time transitioning from sitting to standing.  Plenty of fluids and rest.  Recommend follow-up with your primary care doctor if your balance issues continue.  Please return to the Emergency Department if you experience any worsening of your condition.   Thank you for allowing [place] to be a part of your care.

## 2023-02-03 ENCOUNTER — Other Ambulatory Visit: Payer: Self-pay | Admitting: Family Medicine

## 2023-02-03 MED ORDER — MOLNUPIRAVIR EUA 200MG CAPSULE: 4.0000 | ORAL_CAPSULE | Freq: Two times a day (BID) | ORAL | 0 refills | Status: AC

## 2023-02-03 NOTE — Progress Notes (Signed)
Patient tested positive for COVID yesterday in ER.  His wife called back today requesting antiviral therapy.  They initially requested Paxlovid.  It appears he is on day 3 of symptoms.  Because of multiple potential drug interactions with his current medications will recommend molnupiravir 4 capsules by mouth twice daily for 5 days  [name] MD [facility]

## 2023-02-10 ENCOUNTER — Telehealth: Payer: Self-pay

## 2023-02-10 NOTE — Telephone Encounter (Signed)
     Patient  visit on 02/10/2023  at [facility] was for Generalized Body Aches  Fall.  Have you been able to follow up with your primary care physician? Patient spoke with PCP and was prescribed medication.  The patient was or was not able to obtain any needed medicine or equipment. No medication prescribed in ER.  Are there diet recommendations that you are having difficulty following? No  Patient expresses understanding of discharge instructions and education provided has no other needs at this time. Yes   [place] Resource Care Guide   [email or web address]  ?? [id number]   Website: [email or web address]  [email or web address]

## 2023-02-14 DIAGNOSIS — R413 Other amnesia: Secondary | ICD-10-CM | POA: Diagnosis not present

## 2023-02-15 ENCOUNTER — Telehealth: Payer: Self-pay | Admitting: Family Medicine

## 2023-02-15 NOTE — Telephone Encounter (Signed)
Called patient to schedule Medicare Annual Wellness Visit (AWV). Left message for patient to call back and schedule Medicare Annual Wellness Visit (AWV).  Last date of AWV: 09/19/18  Please schedule an appointment at any time with [facility] or [facility].  If any questions, please contact me at [phone].  Thank you ,  Barkley Boards AWV direct phone # [phone]

## 2023-02-27 ENCOUNTER — Encounter: Payer: Self-pay | Admitting: Neurology

## 2023-02-27 NOTE — Progress Notes (Signed)
Neuropsychological evaluation February 14, 2023 Mr. Belarus and his wife have observed subtle cognitive changes over the last several years, with objective cognitive screening suggesting of a progressive decline since 2018.  He processes risk factors including a family history of Alzheimer disease, chronic use of lithium for mood stabilization, untreated sleep apnea, and hearing loss.  While he has largely maintained independence in daily functioning, his wife has noticed an increased need for assistance with instrumental activities of daily living, particularly concerning his safe driving ability.  Additionally, she has observed recent behavior changes, such as the belief that people on TV can hear or see him. Results of neuropsychological testing were deemed valid and interpretable.  They reveal significant cognitive impairment across multiple domains, most notably affecting frontal executive function and processing speed.  Memory was also substantially impacted, characterized by his shallow encoding, poor spontaneous retrieval, and limited benefit from cues.  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  These findings suggest probable frontal subcortical dysfunction, potentially compounded by early stage of Alzheimer disease.

## 2023-03-07 ENCOUNTER — Other Ambulatory Visit: Payer: Self-pay | Admitting: Family Medicine

## 2023-03-08 DIAGNOSIS — R413 Other amnesia: Secondary | ICD-10-CM | POA: Diagnosis not present

## 2023-03-15 ENCOUNTER — Telehealth: Payer: Self-pay | Admitting: Neurology

## 2023-03-15 NOTE — Telephone Encounter (Signed)
Pt wife called. Stated she would like for Dr. April Manson to giver her a call to go over test results.  Pt wife is requesting Dr. April Manson please call.

## 2023-03-16 NOTE — Telephone Encounter (Signed)
Left a message for wife confirming that the Neuropsych testing shows Alzheimer dementia. Ask them to schedule a follow up appointment if they would like to discuss the diagnosis further.   Dr. April Manson

## 2023-04-06 ENCOUNTER — Other Ambulatory Visit: Payer: Self-pay | Admitting: Family Medicine

## 2023-04-13 ENCOUNTER — Telehealth: Payer: Self-pay | Admitting: Family Medicine

## 2023-04-13 NOTE — Telephone Encounter (Signed)
Called patient to schedule Medicare Annual Wellness Visit (AWV). Left message for patient to call back and schedule Medicare Annual Wellness Visit (AWV).  Last date of AWV: 09/19/18  Please schedule an appointment at any time with [facility] or hannah kim.  If any questions, please contact me at [phone].  Thank you ,  Rudell Cobb AWV direct phone # [phone]

## 2023-06-07 ENCOUNTER — Ambulatory Visit (INDEPENDENT_AMBULATORY_CARE_PROVIDER_SITE_OTHER): Payer: Medicare Other | Admitting: Psychiatry

## 2023-06-07 ENCOUNTER — Encounter: Payer: Self-pay | Admitting: Psychiatry

## 2023-06-07 DIAGNOSIS — G3184 Mild cognitive impairment, so stated: Secondary | ICD-10-CM | POA: Diagnosis not present

## 2023-06-07 DIAGNOSIS — F422 Mixed obsessional thoughts and acts: Secondary | ICD-10-CM

## 2023-06-07 DIAGNOSIS — F3342 Major depressive disorder, recurrent, in full remission: Secondary | ICD-10-CM | POA: Diagnosis not present

## 2023-06-07 DIAGNOSIS — R7989 Other specified abnormal findings of blood chemistry: Secondary | ICD-10-CM

## 2023-06-07 DIAGNOSIS — F411 Generalized anxiety disorder: Secondary | ICD-10-CM

## 2023-06-07 MED ORDER — MEMANTINE HCL 10 MG PO TABS: ORAL_TABLET | ORAL | 0 refills | Status: DC

## 2023-06-07 NOTE — Progress Notes (Signed)
[name] [id number] [birth year] 82 y.o.  Subjective:   Patient ID:  Jason [name] is a 82 y.o. (DOB 07/03/1941) male.  Chief Complaint:  Chief Complaint  Patient presents with   Follow-up    Depression        Associated symptoms include no decreased concentration and no suicidal ideas.  Past medical history includes anxiety.   Anxiety Symptoms include nervous/anxious behavior. Patient reports no chest pain, confusion, decreased concentration, palpitations or suicidal ideas.       Jason [name] presents to the office today for follow-up of MDE, OCD and GAD.  seen August, 2020.  No meds were changed.  5/5//21  Seen with wife [name]. Had mild Covid in fall 2020 and got the vaccine.   CO forgetfulness with names and places but not getting words out correctly.   Wife notices the same but she's not overly concerned but she has noticed occ gets confused about people. PCP checked b12 normal bc pt brought up memory concern.   11/16/20 appt with following noted:  Seen with wife [name] Still doing well. Occ spellls of anxiety randomly.  Does better if doesn't forget meds. No SE.  Memory trouble limited mainly to names.  Not much worse.  OK otherwise. Depression can occur but only last a day.  Depression and anxiety under control.  Except notices Feb each year a change in disposition but it's minor.  Patient reports stable mood and denies depressed or irritable moods.   Patient denies difficulty with sleep initiation or maintenance but it is split with some napping.  EMA. Denies appetite disturbance.  Patient reports that energy and motivation have been good.  Patient denies any difficulty with concentration.  Patient denies any suicidal ideation. Stress caring for inlaws in their 90s.  F good cognition.  M cognitive problems. Plan: Patient's lithium level was 0.51-year ago on 600 mg daily.  Level on 11/28/2020 was 0.9 on 600 mg daily.  The lithium level has crept up a little bit.  I am concerned  it may start causing side effects.  Therefore a prescription has been sent so that he can reduce the dose to 450 mg daily by using 3 of the 150 mg capsules.  06/10/2021 appointment with the following noted: seen with wife Doing fine and no problems with reduction in lithium to 450 mg daily. Frustration and down with world and politics and can interfere with sleep lately better. Staying up later and more irregular pattern.  Will nap at times. Questions about meds Plan no changes  12/09/21 appt noted: seen with wife Sertraline 200 mg daily and lithium 450 mg and donepezil 10 mg HS, Cerefolin NAC Consistent with meds.  No SE Doing well for 82 yo. Patient reports stable mood and denies depressed or irritable moods.  Patient denies any recent difficulty with anxiety.  Patient denies difficulty with sleep initiation or maintenance. Denies appetite disturbance.  Patient reports that energy and motivation have been good.  Patient denies any difficulty with concentration.  Patient denies any suicidal ideation. Helping homeless families. Plan: High relapse risk if reduce meds.  But rarely high dose sertraline can interfere with cognition but he's at risk with reduction. No med changes. Continue lithium 450 mg daily Sertraline 200 Aricept 10  06/06/22 appt noted: seen with wife per usual Doing fine except fell off porch and hip fx with replacement since here. Recovery has gone well.  After surgery in March.  Finished PT at home and now  some outpt PT. Dr. Loreli Dollar.  Pleased with him. Not depressed.  Patient reports stable mood and denies depressed or irritable moods.  Patient denies any recent difficulty with anxiety.  Patient denies difficulty with sleep initiation or maintenance. Denies appetite disturbance.  Patient reports that energy and motivation have been good.  Patient has mild forgetfulness. Patient denies any suicidal ideation. No SE problems.  12/06/22 appt noted:  seen with wife Good  I most respects. Still some memory issues and tries to work on it and pay attention better. Not losing stuff.   Saw Dr. Tomma Lightning dx MCI  MMSE 23/30.  Plans neuropsych testing in Feb [facility], [place] [facility].  Also sched MRI 09/21/22 mild mod atrophy Satisfied with meds.   Not dep. Momentary anxiety but not persistent.  No panic. Sleep is best it's been at the moment.  More effort to improve it.  Was going to sleep too late.   No problems with meds. Plan: No med changes. Continue lithium 450 mg daily Sertraline 200.  Option reduce Aricept 10 Continue Vitamin D with level in 50s Check lithium level  6/12/224 appt noted:  seen with wife.   I think I'm doing good.  Except forgetful esp names.  Fights off the forgetfulness.   He thinks it is better than it was.   Not depressed.  Anxiety ok.   Continues meds including Cerefolin NAC. No SE with meds.  Except poss tremor.   No concerns with meds.   DX mild OSA previously without benefit from CPAP .  Lost wt since then. Wife notices if several things going on then pt has trouble keeping up with them.   Past Psychiatric Medication Trials: Wellbutrin side effects, Lexapro nausea,  paroxetine, fluoxetine briefly, duloxetine, Effexor with a history of benefit, mirtazapine, Pristiq 100, sertraline 200,  Abilify,  buspirone side effects, lithium 600, olanzapine 2.5 with benefit. Aricept  ECT  Review of Systems:  Review of Systems  HENT:  Positive for hearing loss.   Respiratory:  Negative for chest tightness.   Cardiovascular:  Negative for chest pain and palpitations.  Musculoskeletal:  Positive for arthralgias and back pain.  Neurological:  Negative for tremors.  Psychiatric/Behavioral:  Negative for agitation, behavioral problems, confusion, decreased concentration, dysphoric mood, hallucinations, self-injury, sleep disturbance and suicidal ideas. The patient is nervous/anxious. The patient is not hyperactive.      Medications: I have reviewed the patient's current medications.  Current Outpatient Medications  Medication Sig Dispense Refill   amLODipine (NORVASC) 5 MG tablet TAKE 1 TABLET BY MOUTH EVERY DAY 90 tablet 0   donepezil (ARICEPT) 10 MG tablet Take 1 tablet (10 mg total) by mouth at bedtime. 90 tablet 3   ferrous sulfate 325 (65 FE) MG tablet Take 325 mg by mouth daily with breakfast.     lithium carbonate 150 MG capsule Take 3 capsules (450 mg total) by mouth daily. 270 capsule 3   memantine (NAMENDA) 10 MG tablet 1/2 tablet at night for 1 week then 1/2 tablet twice daily for 1 week, then 1 tablet twice daily 60 tablet 0   Methylfol-Algae-B12-Acetylcyst (CEREFOLIN NAC) 6-90.314-2-600 MG TABS TAKE 1 TABLET BY MOUTH EVERY DAY 90 tablet 3   Multiple Vitamins-Minerals (MULTIVITAMIN ADULTS) TABS Take 1 tablet by mouth daily.     sertraline (ZOLOFT) 100 MG tablet Take 2 tablets (200 mg total) by mouth daily. 180 tablet 3   simvastatin (ZOCOR) 20 MG tablet TAKE 1 TABLET BY MOUTH EVERY DAY  90 tablet 0   Vitamin D, Ergocalciferol, (DRISDOL) 1.25 MG (50000 UNIT) CAPS capsule TAKE 1 CAPSULE BY MOUTH ONE TIME PER WEEK 12 capsule 1   No current facility-administered medications for this visit.    Medication Side Effects: None except occ jerks from lithium  Allergies: No Known Allergies  Past Medical History:  Diagnosis Date   Anxiety    Atrial fibrillation (HCC)    past hx   Colon polyp    Depression    GERD (gastroesophageal reflux disease)    Gout, unspecified    Hypertension    Osteoarthrosis, unspecified whether generalized or localized, unspecified site    Pneumonia, organism unspecified(486)    Pure hypercholesterolemia    Routine general medical examination at a health care facility    Sleep apnea    not using cpap currently   Unspecified nonpsychotic mental disorder     Family History  Problem Relation Age of Onset   Dementia Mother    Pneumonia Father    Heart failure  Father    Arthritis Other    Colon cancer Neg Hx    Colon polyps Neg Hx    Esophageal cancer Neg Hx    Stomach cancer Neg Hx    Rectal cancer Neg Hx     Social History   Socioeconomic History   Marital status: Married    Spouse name: [name]   Number of children: 0   Years of education: Not on file   Highest education level: Not on file  Occupational History   Occupation: retired    Associate Professor: [employer]  Tobacco Use   Smoking status: Never   Smokeless tobacco: Never  Vaping Use   Vaping Use: Never used  Substance and Sexual Activity   Alcohol use: No   Drug use: No   Sexual activity: Not on file  Other Topics Concern   Not on file  Social History Narrative   Not on file   Social Determinants of Health   Financial Resource Strain: Not on file  Food Insecurity: Not on file  Transportation Needs: Not on file  Physical Activity: Not on file  Stress: Not on file  Social Connections: Not on file  Intimate Partner Violence: Not on file    Past Medical History, Surgical history, Social history, and Family history were reviewed and updated as appropriate.   Please see review of systems for further details on the patient's review from today.   Objective:   Physical Exam:  There were no vitals taken for this visit.  Physical Exam Constitutional:      General: He is not in acute distress.    Appearance: He is well-developed.  Musculoskeletal:        General: No deformity.  Neurological:     Mental Status: He is alert and oriented to person, place, and time.     Motor: No tremor.     Coordination: Coordination normal.  Psychiatric:        Attention and Perception: Attention normal. He is attentive. He does not perceive auditory hallucinations.        Mood and Affect: Mood normal. Mood is not anxious or depressed. Affect is not labile, blunt, angry, tearful or inappropriate.        Speech: Speech normal. Speech is not slurred.        Behavior: Behavior normal.  Behavior is not slowed.        Thought Content: Thought content normal. Thought content is not delusional.  Thought content does not include homicidal or suicidal ideation. Thought content does not include suicidal plan.        Cognition and Memory: Cognition normal. He exhibits impaired recent memory.        Judgment: Judgment normal.     Comments: Insight is fair to good. Positive affect     Lab Review:     Component Value Date/Time   NA 135 02/02/2023 0128   K 3.7 02/02/2023 0128   CL 103 02/02/2023 0128   CO2 23 02/02/2023 0128   GLUCOSE 112 (H) 02/02/2023 0128   BUN 16 02/02/2023 0128   CREATININE 0.92 02/02/2023 0128   CALCIUM 9.9 02/02/2023 0128   PROT 6.7 02/02/2023 0128   ALBUMIN 4.3 02/02/2023 0128   AST 21 02/02/2023 0128   ALT 16 02/02/2023 0128   ALKPHOS 46 02/02/2023 0128   BILITOT 0.5 02/02/2023 0128   GFRNONAA >60 02/02/2023 0128   GFRAA >60 04/19/2020 1247       Component Value Date/Time   WBC 6.9 02/02/2023 0128   RBC 3.96 (L) 02/02/2023 0128   HGB 12.7 (L) 02/02/2023 0128   HCT 37.6 (L) 02/02/2023 0128   PLT 173 02/02/2023 0128   MCV 94.9 02/02/2023 0128   MCH 32.1 02/02/2023 0128   MCHC 33.8 02/02/2023 0128   RDW 13.3 02/02/2023 0128   LYMPHSABS 1.6 04/19/2022 1511   MONOABS 0.6 04/19/2022 1511   EOSABS 0.2 04/19/2022 1511   BASOSABS 0.1 04/19/2022 1511   Normal TSH in September.  Last lithium 0.8 in July 2019  Lithium level January 10, 2027 0.5  Lithium Lvl  Date Value Ref Range Status  01/31/2023 0.6 0.6 - 1.2 mmol/L Final   01/04/22 lithium level 0.6 on 450 mg daily.  No results found for: "PHENYTOIN", "PHENOBARB", "VALPROATE", "CBMZ"   .res Assessment: Plan:    Recurrent major depression in complete remission (HCC)  Mixed obsessional thoughts and acts  Generalized anxiety disorder  Mild cognitive impairment - Plan: memantine (NAMENDA) 10 MG tablet  Low vitamin D level   Mr. Wierzbicki has a long history of the above diagnoses  which have been treatment resistant at times and multiple relapses.  He ultimately responded to the combination of sertraline and lithium and Zyprexa.  He has been able to wean off the Zyprexa without complication.  He's continuing to do well.  As noted multiple failed meds and a history of treatment resistance. Each diagnosis under control and meds helping.   Extensive disucussion of sleep hygiene.  Also has nocturia.  Agree with pursuing hearing aids given cognitive concerns.  Then consider neuro work up.  Supportive therapy dealing with sick in-laws and caretaking.  Encourage self-care and recreation.  Also dealing with homeless people.  Counseled patient regarding potential benefits, risks, and side effects of lithium to include potential risk of lithium affecting thyroid and renal function.  Discussed need for periodic lab monitoring to determine drug level and to assess for potential adverse effects.  Counseled patient regarding signs and symptoms of lithium toxicity and advised that they notify office immediately or seek urgent medical attention if experiencing these signs and symptoms.  Patient advised to contact office with any questions or concerns.  Call if any changes in BP meds.  Disc DDI  Lithium level is in the low normal range and is typical for the levels he has had over the last couple of years.  Labs good.  Disc risk s and TSH also normal. Has normal D  on 50K weekly. and B12 levels Reduced lithium from 600 to 450 Dec 2021 bc Cr creep without problems. 03/14/22 Cr. 0.8. Continue lithium to AM to see if nocturia is helped.  01/04/22 lithium level 0.6 on 450 mg daily. 01/31/23 Lithium 0.6  on 450 mg daily  Disc the difference between Cerefolin NAC and Metafolbic plus angain in detail.Marland Kitchen option switch to NAC & B complex  Disc donepezil 10 mg nightly.   Neuro workup normal and neuropsych testing with the following results per Dr. Windell Norfolk, [facility]. Results of neuropsychological  testing were deemed valid and interpretable.  They reveal significant cognitive impairment across multiple domains, most notably affecting frontal executive function and processing speed.  Memory was also substantially impacted, characterized by his shallow encoding, poor spontaneous retrieval, and limited benefit from cues.  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  These findings suggest probable frontal subcortical dysfunction, potentially compounded by early stage of Alzheimer disease.  Disc these results with pt and his wife at their request.  Disc need to get hearing aids and how they might help cognition or slow cognitive decline.  Option Namenda  High relapse risk if reduce meds.  But rarely high dose sertraline can interfere with cognition but he's at risk with reduction. Yes, start Namenda trial Continue lithium 450 mg daily Sertraline 200.  Option reduce Aricept 10 Continue Vitamin D with level in 50s  This appt was 30 mins.  FU 6 mos  Meredith Staggers, MD, DFAPA     Please see After Visit Summary for patient specific instructions.  Future Appointments  Date Time Provider Department Center  09/06/2023  2:45 PM Windell Norfolk, MD [facility] None     No orders of the defined types were placed in this encounter.     -------------------------------

## 2023-06-15 ENCOUNTER — Other Ambulatory Visit: Payer: Self-pay | Admitting: Psychiatry

## 2023-06-24 ENCOUNTER — Other Ambulatory Visit: Payer: Self-pay | Admitting: Psychiatry

## 2023-06-24 ENCOUNTER — Other Ambulatory Visit: Payer: Self-pay | Admitting: Family Medicine

## 2023-06-24 DIAGNOSIS — F3342 Major depressive disorder, recurrent, in full remission: Secondary | ICD-10-CM

## 2023-07-02 ENCOUNTER — Other Ambulatory Visit: Payer: Self-pay | Admitting: Psychiatry

## 2023-07-02 DIAGNOSIS — G3184 Mild cognitive impairment, so stated: Secondary | ICD-10-CM

## 2023-07-13 ENCOUNTER — Other Ambulatory Visit: Payer: Self-pay | Admitting: Family Medicine

## 2023-07-21 ENCOUNTER — Other Ambulatory Visit: Payer: Self-pay | Admitting: Psychiatry

## 2023-07-21 DIAGNOSIS — G3184 Mild cognitive impairment, so stated: Secondary | ICD-10-CM

## 2023-08-14 ENCOUNTER — Other Ambulatory Visit: Payer: Self-pay | Admitting: Family Medicine

## 2023-09-06 ENCOUNTER — Ambulatory Visit (INDEPENDENT_AMBULATORY_CARE_PROVIDER_SITE_OTHER): Payer: Medicare Other | Admitting: Neurology

## 2023-09-06 ENCOUNTER — Encounter: Payer: Self-pay | Admitting: Neurology

## 2023-09-06 VITALS — BP 118/64 | Ht 74.0 in | Wt 208.0 lb

## 2023-09-06 DIAGNOSIS — F02A0 Dementia in other diseases classified elsewhere, mild, without behavioral disturbance, psychotic disturbance, mood disturbance, and anxiety: Secondary | ICD-10-CM

## 2023-09-06 DIAGNOSIS — G301 Alzheimer's disease with late onset: Secondary | ICD-10-CM | POA: Diagnosis not present

## 2023-09-06 NOTE — Progress Notes (Signed)
[facility]  PATIENT: Jason [name] DOB: August 07, 1941  REQUESTING CLINICIAN: Kristian Covey, MD HISTORY FROM: Patient and spouse  REASON FOR VISIT: Memory impairment    HISTORICAL  CHIEF COMPLAINT:  Chief Complaint  Patient presents with   Follow-up    Rm 13, with wife Jason [name], wife sees improvement with namenda. MOCA 19   INTERVAL HISTORY 09/06/2023 Patient presents today for follow-up, he is accompanied by wife.  Last visit was in September last year, since then he did have a full neuropsychological testing which was consistent with a diagnosis of mild Alzheimer disease.  I started him on Aricept, he tolerated the medication very well and he did follow-up with his psychiatrist who added Namenda. Wife believes since the addition of Namenda his cognition has improved.  Currently patient does not have any question of concern, he is doing well and wife also feels like he is doing better.   HISTORY OF PRESENT ILLNESS:  This is a 82 year old man with past medical history of hypertension, memory problem, depression who is presenting with memory problem. Patient reports issue with memory problem as sometimes he is forgetful about people's name, this is people that he met at church, he denies any issue with family members name or names of close friends.  Sometimes he has confusion like this week he thought today's exam was his follow-up with orthopedic surgeon which she is scheduled for Thursday instead of his neurological appointment.   Wife reports that she feels that he is not making coherent judgment, she gives the example that 1 day they were supposed to go to the mechanic and it was raining, poor vision.  Wife made her turn and he did not see her and keep going and he did not have his phone at that time.  Instead of stopping somewhere to call his wife, he kept driving and look at every Teaching laboratory technician on [address]. He had been looking for wife for almost 4 hours and at  the end of the day he was parked at the [employer] which was closed at that time reading the newspaper. Wife said that this is not  him, because in the past he will stop somewhere get a cell phone and call her. Wife also reports that sometimes he will say that people on TV, specially people and [place] are watching him.    Wife reports difficulty understanding but this might be related to his hearing loss.  He was seen by audiology, recommended hearing aids but has not gotten them due to price.   TBI:  No past history of TBI Stroke:   no past history of stroke Seizures:   no past history of seizures Sleep:   no history of sleep apnea Mood:   patient has depression Family history of Dementia: Sister with Alzheimer, diagnosed at 13, died at 56. Brother has history of brain bleed   Functional status: independent in some ADLs Patient lives with Spouse . Cooking: No  Cleaning: Yes  Shopping: Yes  Bathing: Yes  Toileting: Yes  Driving: Yes, no recent accident, denies getting lost in familiar places  Bills: Wife  Medications: He is not on Benzodiazepine or opioids  Ever left the stove on by accident?: No  Forget how to use items around the house?: No  Getting lost going to familiar places?: No  Forgetting loved ones names?: No  Word finding difficulty? Yes  Sleep: Good    OTHER MEDICAL CONDITIONS: Hypertension, Memory deficit, Depression  REVIEW OF SYSTEMS: Full 14 system review of systems performed and negative with exception of: As noted in the HPI   ALLERGIES: No Known Allergies  HOME MEDICATIONS: Outpatient Medications Prior to Visit  Medication Sig Dispense Refill   amLODipine (NORVASC) 5 MG tablet TAKE 1 TABLET BY MOUTH EVERY DAY 30 tablet 0   donepezil (ARICEPT) 10 MG tablet TAKE 1 TABLET BY MOUTH EVERYDAY AT BEDTIME 90 tablet 3   ferrous sulfate 325 (65 FE) MG tablet Take 325 mg by mouth daily with breakfast.     lithium carbonate 150 MG capsule TAKE 3 CAPSULES (450 MG  TOTAL) BY MOUTH DAILY. 270 capsule 1   memantine (NAMENDA) 10 MG tablet Take 1 tablet twice daily 180 tablet 1   Methylfol-Algae-B12-Acetylcyst (CEREFOLIN NAC) 6-90.314-2-600 MG TABS TAKE 1 TABLET BY MOUTH EVERY DAY 90 tablet 0   Multiple Vitamins-Minerals (MULTIVITAMIN ADULTS) TABS Take 1 tablet by mouth daily.     sertraline (ZOLOFT) 100 MG tablet Take 2 tablets (200 mg total) by mouth daily. 180 tablet 3   simvastatin (ZOCOR) 20 MG tablet TAKE 1 TABLET BY MOUTH EVERY DAY 30 tablet 0   Vitamin D, Ergocalciferol, (DRISDOL) 1.25 MG (50000 UNIT) CAPS capsule TAKE 1 CAPSULE BY MOUTH ONE TIME PER WEEK 12 capsule 0   No facility-administered medications prior to visit.    PAST MEDICAL HISTORY: Past Medical History:  Diagnosis Date   Anxiety    Atrial fibrillation (HCC)    past hx   Colon polyp    Depression    GERD (gastroesophageal reflux disease)    Gout, unspecified    Hypertension    Osteoarthrosis, unspecified whether generalized or localized, unspecified site    Pneumonia, organism unspecified(486)    Pure hypercholesterolemia    Routine general medical examination at a health care facility    Sleep apnea    not using cpap currently   Unspecified nonpsychotic mental disorder     PAST SURGICAL HISTORY: Past Surgical History:  Procedure Laterality Date   COLONOSCOPY  10/04/2006   jacobs   TOTAL HIP ARTHROPLASTY Right 03/12/2022   Procedure: TOTAL HIP ARTHROPLASTY ANTERIOR APPROACH;  Surgeon: Samson Frederic, MD;  Location: [facility];  Service: Orthopedics;  Laterality: Right;   UPPER GASTROINTESTINAL ENDOSCOPY  2015   WISDOM TOOTH EXTRACTION      FAMILY HISTORY: Family History  Problem Relation Age of Onset   Dementia Mother    Pneumonia Father    Heart failure Father    Arthritis Other    Colon cancer Neg Hx    Colon polyps Neg Hx    Esophageal cancer Neg Hx    Stomach cancer Neg Hx    Rectal cancer Neg Hx     SOCIAL HISTORY: Social History   Socioeconomic  History   Marital status: Married    Spouse name: Jason [name]   Number of children: 0   Years of education: Not on file   Highest education level: Not on file  Occupational History   Occupation: retired    Associate Professor: [employer]  Tobacco Use   Smoking status: Never   Smokeless tobacco: Never  Vaping Use   Vaping status: Never Used  Substance and Sexual Activity   Alcohol use: No   Drug use: No   Sexual activity: Not on file  Other Topics Concern   Not on file  Social History Narrative   Right handed   Lives with wife   Caffeine-1-2 daily   Social Determinants of Health  Financial Resource Strain: Not on file  Food Insecurity: Not on file  Transportation Needs: Not on file  Physical Activity: Not on file  Stress: Not on file  Social Connections: Not on file  Intimate Partner Violence: Not on file    PHYSICAL EXAM  GENERAL EXAM/CONSTITUTIONAL: Vitals:  Vitals:   09/06/23 1431  BP: 118/64  Weight: 208 lb (94.3 kg)  Height: 6\' 2"  (1.88 m)   Body mass index is 26.71 kg/m. Wt Readings from Last 3 Encounters:  09/06/23 208 lb (94.3 kg)  02/01/23 199 lb (90.3 kg)  10/21/22 199 lb (90.3 kg)   Patient is in no distress; well developed, nourished and groomed; neck is supple  MUSCULOSKELETAL: Gait, strength, tone, movements noted in Neurologic exam below  NEUROLOGIC: MENTAL STATUS:     09/19/2018    1:50 PM 09/12/2017    1:34 PM  MMSE - Mini Mental State Exam  Orientation to time 3 5  Orientation to Place 5 5  Registration 3 3  Attention/ Calculation 5 5  Recall 2 3  Language- name 2 objects 2 2  Language- repeat 1 1  Language- follow 3 step command 3 3  Language- read & follow direction 1 1  Write a sentence 1 1  Copy design 1 1  Total score 27 30         09/06/2023    2:35 PM 09/06/2022    1:21 PM  Montreal Cognitive Assessment   Visuospatial/ Executive (0/5) 3 3  Naming (0/3) 3 3  Attention: Read list of digits (0/2) 2 2  Attention: Read list of  letters (0/1) 1 1  Attention: Serial 7 subtraction starting at 100 (0/3) 0 3  Language: Repeat phrase (0/2) 2 1  Language : Fluency (0/1) 1 0  Abstraction (0/2) 2 2  Delayed Recall (0/5) 0 2  Orientation (0/6) 5 6  Total 19 23    CRANIAL NERVE:  2nd, 3rd, 4th, 6th - visual fields full to confrontation, extraocular muscles intact, no nystagmus 5th - facial sensation symmetric 7th - facial strength symmetric 8th - hearing intact 9th - palate elevates symmetrically, uvula midline 11th - shoulder shrug symmetric 12th - tongue protrusion midline  MOTOR:  normal bulk and tone, full strength in the BUE, BLE  SENSORY:  normal and symmetric to light touch  COORDINATION:  finger-nose-finger, fine finger movements normal GAIT/STATION:  normal   DIAGNOSTIC DATA (LABS, IMAGING, TESTING) - I reviewed patient records, labs, notes, testing and imaging myself where available.  Lab Results  Component Value Date   WBC 6.9 02/02/2023   HGB 12.7 (L) 02/02/2023   HCT 37.6 (L) 02/02/2023   MCV 94.9 02/02/2023   PLT 173 02/02/2023      Component Value Date/Time   NA 135 02/02/2023 0128   K 3.7 02/02/2023 0128   CL 103 02/02/2023 0128   CO2 23 02/02/2023 0128   GLUCOSE 112 (H) 02/02/2023 0128   BUN 16 02/02/2023 0128   CREATININE 0.92 02/02/2023 0128   CALCIUM 9.9 02/02/2023 0128   PROT 6.7 02/02/2023 0128   ALBUMIN 4.3 02/02/2023 0128   AST 21 02/02/2023 0128   ALT 16 02/02/2023 0128   ALKPHOS 46 02/02/2023 0128   BILITOT 0.5 02/02/2023 0128   GFRNONAA >60 02/02/2023 0128   GFRAA >60 04/19/2020 1247   Lab Results  Component Value Date   CHOL 169 02/02/2022   HDL 80.80 02/02/2022   LDLCALC 78 02/02/2022   TRIG 51.0 02/02/2022  CHOLHDL 2 02/02/2022   Lab Results  Component Value Date   HGBA1C 5.3 03/12/2022   Lab Results  Component Value Date   VITAMINB12 1,126 (H) 03/14/2022    Brain MRI 09/24/2022 -Mild to moderate atrophy and mild chronic small vessel ischemic  disease. -No acute findings   Neuropsychological evaluation February 14, 2023 Mr. [place] and his wife have observed subtle cognitive changes over the last several years, with objective cognitive screening suggesting of a progressive decline since 2018.  He processes risk factors including a family history of Alzheimer disease, chronic use of lithium for mood stabilization, untreated sleep apnea, and hearing loss.  While he has largely maintained independence in daily functioning, his wife has noticed an increased need for assistance with instrumental activities of daily living, particularly concerning his safe driving ability.  Additionally, she has observed recent behavior changes, such as the belief that people on TV can hear or see him. Results of neuropsychological testing were deemed valid and interpretable.  They reveal significant cognitive impairment across multiple domains, most notably affecting frontal executive function and processing speed.  Memory was also substantially impacted, characterized by his shallow encoding, poor spontaneous retrieval, and limited benefit from cues.  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  These findings suggest probable frontal subcortical dysfunction, potentially compounded by early stage of Alzheimer disease.   ASSESSMENT AND PLAN  82 y.o. year old male with history of hypertension, hyperlipidemia, memory deficit who is presenting for follow-up, last visit was in September 2023.  Since then he did have a full neuropsychological testing which was consistent with mild Alzheimer disease.  Namenda added and patient wife feels like his memory is improving even though today his MoCA score went down from 23-19.  Overall he reports that he is stable, has no current complaints and wife does not have any current concerns.  No reports of agitation or irritability.  Plan will be for patient to continue current  medications and I will see him in 1 year for follow-up advised him to contact me if they have any new concerns.    1. Mild late onset Alzheimer's dementia without behavioral disturbance, psychotic disturbance, mood disturbance, or anxiety (HCC)       Patient Instructions  Continue current medications including Aricept 10 mg nightly and Namenda 10 mg twice daily Continue to follow-up with your doctors Continue exercise at least 20 minutes a day 5 days a week Contact me if you have any questions, if there is new irritability, lack of sleep or any other concerns. Return in 1 year or sooner if worse  There are well-accepted and sensible ways to reduce risk for Alzheimers disease and other degenerative brain disorders .  Exercise Daily Walk A daily 20 minute walk should be part of your routine. Disease related apathy can be a significant roadblock to exercise and the only way to overcome this is to make it a daily routine and perhaps have a reward at the end (something your loved one loves to eat or drink perhaps) or a personal trainer coming to the home can also be very useful. Most importantly, the patient is much more likely to exercise if the caregiver / spouse does it with him/her. In general a structured, repetitive schedule is best.  General Health: Any diseases which effect your body will effect your brain such as a pneumonia, urinary infection, blood clot, heart attack or stroke. Keep contact with your primary care doctor for  regular follow ups.  Sleep. A good nights sleep is healthy for the brain. Seven hours is recommended. If you have insomnia or poor sleep habits we can give you some instructions. If you have sleep apnea wear your mask.  Diet: Eating a heart healthy diet is also a good idea; fish and poultry instead of red meat, nuts (mostly non-peanuts), vegetables, fruits, olive oil or canola oil (instead of butter), minimal salt (use other spices to flavor foods), whole grain rice,  bread, cereal and pasta and wine in moderation.Research is now showing that the MIND diet, which is a combination of The Mediterranean diet and the DASH diet, is beneficial for cognitive processing and longevity. Information about this diet can be found in The MIND Diet, a book by Alonna Minium, MS, RDN, and online at [employer]  Finances, Power of [address] and Advance Directives: You should consider putting legal safeguards in place with regard to financial and medical decision making. While the spouse always has power of attorney for medical and financial issues in the absence of any form, you should consider what you want in case the spouse / caregiver is no longer around or capable of making decisions.     No orders of the defined types were placed in this encounter.   No orders of the defined types were placed in this encounter.   Return if symptoms worsen or fail to improve.   Windell Norfolk, MD 09/08/2023, 6:17 PM  [facility] [address], [address] [place], [place] [address] [phone]

## 2023-09-07 ENCOUNTER — Ambulatory Visit: Payer: Medicare Other | Admitting: Neurology

## 2023-09-08 NOTE — Patient Instructions (Signed)
Continue current medications including Aricept 10 mg nightly and Namenda 10 mg twice daily Continue to follow-up with your doctors Continue exercise at least 20 minutes a day 5 days a week Contact me if you have any questions, if there is new irritability, lack of sleep or any other concerns. Return in 1 year or sooner if worse  There are well-accepted and sensible ways to reduce risk for Alzheimers disease and other degenerative brain disorders .  Exercise Daily Walk A daily 20 minute walk should be part of your routine. Disease related apathy can be a significant roadblock to exercise and the only way to overcome this is to make it a daily routine and perhaps have a reward at the end (something your loved one loves to eat or drink perhaps) or a personal trainer coming to the home can also be very useful. Most importantly, the patient is much more likely to exercise if the caregiver / spouse does it with him/her. In general a structured, repetitive schedule is best.  General Health: Any diseases which effect your body will effect your brain such as a pneumonia, urinary infection, blood clot, heart attack or stroke. Keep contact with your primary care doctor for regular follow ups.  Sleep. A good nights sleep is healthy for the brain. Seven hours is recommended. If you have insomnia or poor sleep habits we can give you some instructions. If you have sleep apnea wear your mask.  Diet: Eating a heart healthy diet is also a good idea; fish and poultry instead of red meat, nuts (mostly non-peanuts), vegetables, fruits, olive oil or canola oil (instead of butter), minimal salt (use other spices to flavor foods), whole grain rice, bread, cereal and pasta and wine in moderation.Research is now showing that the MIND diet, which is a combination of The Mediterranean diet and the DASH diet, is beneficial for cognitive processing and longevity. Information about this diet can be found in The MIND Diet, a book by  Alonna Minium, MS, RDN, and online at [employer]  Finances, Power of [address] and Advance Directives: You should consider putting legal safeguards in place with regard to financial and medical decision making. While the spouse always has power of attorney for medical and financial issues in the absence of any form, you should consider what you want in case the spouse / caregiver is no longer around or capable of making decisions.

## 2023-09-11 ENCOUNTER — Ambulatory Visit: Payer: Medicare Other | Admitting: Neurology

## 2023-09-11 DIAGNOSIS — M25561 Pain in right knee: Secondary | ICD-10-CM | POA: Diagnosis not present

## 2023-09-13 ENCOUNTER — Other Ambulatory Visit: Payer: Self-pay | Admitting: Psychiatry

## 2023-09-18 ENCOUNTER — Other Ambulatory Visit: Payer: Self-pay | Admitting: Family Medicine

## 2023-09-26 DIAGNOSIS — H04123 Dry eye syndrome of bilateral lacrimal glands: Secondary | ICD-10-CM | POA: Diagnosis not present

## 2023-09-26 DIAGNOSIS — M1711 Unilateral primary osteoarthritis, right knee: Secondary | ICD-10-CM | POA: Diagnosis not present

## 2023-09-26 DIAGNOSIS — H5203 Hypermetropia, bilateral: Secondary | ICD-10-CM | POA: Diagnosis not present

## 2023-09-26 DIAGNOSIS — H2513 Age-related nuclear cataract, bilateral: Secondary | ICD-10-CM | POA: Diagnosis not present

## 2023-10-11 ENCOUNTER — Other Ambulatory Visit: Payer: Self-pay | Admitting: Family Medicine

## 2023-10-17 DIAGNOSIS — Z23 Encounter for immunization: Secondary | ICD-10-CM | POA: Diagnosis not present

## 2023-10-25 ENCOUNTER — Other Ambulatory Visit: Payer: Self-pay | Admitting: Family Medicine

## 2023-11-10 ENCOUNTER — Encounter: Payer: Self-pay | Admitting: Family Medicine

## 2023-11-10 ENCOUNTER — Ambulatory Visit (INDEPENDENT_AMBULATORY_CARE_PROVIDER_SITE_OTHER): Payer: Medicare Other | Admitting: Family Medicine

## 2023-11-10 VITALS — BP 110/74 | HR 82 | Temp 98.0°F | Ht 73.62 in | Wt 211.2 lb

## 2023-11-10 DIAGNOSIS — Z79899 Other long term (current) drug therapy: Secondary | ICD-10-CM

## 2023-11-10 DIAGNOSIS — Z23 Encounter for immunization: Secondary | ICD-10-CM

## 2023-11-10 DIAGNOSIS — E7800 Pure hypercholesterolemia, unspecified: Secondary | ICD-10-CM

## 2023-11-10 DIAGNOSIS — I10 Essential (primary) hypertension: Secondary | ICD-10-CM

## 2023-11-10 LAB — CBC WITH DIFFERENTIAL/PLATELET
Basophils Absolute: 0 10*3/uL (ref 0.0–0.1)
Basophils Relative: 0.7 % (ref 0.0–3.0)
Eosinophils Absolute: 0.3 10*3/uL (ref 0.0–0.7)
Eosinophils Relative: 5 % (ref 0.0–5.0)
HCT: 39.3 % (ref 39.0–52.0)
Hemoglobin: 13 g/dL (ref 13.0–17.0)
Lymphocytes Relative: 29.8 % (ref 12.0–46.0)
Lymphs Abs: 2 10*3/uL (ref 0.7–4.0)
MCHC: 33.1 g/dL (ref 30.0–36.0)
MCV: 97.2 fL (ref 78.0–100.0)
Monocytes Absolute: 0.6 10*3/uL (ref 0.1–1.0)
Monocytes Relative: 9.1 % (ref 3.0–12.0)
Neutro Abs: 3.6 10*3/uL (ref 1.4–7.7)
Neutrophils Relative %: 55.4 % (ref 43.0–77.0)
Platelets: 218 10*3/uL (ref 150.0–400.0)
RBC: 4.04 Mil/uL — ABNORMAL LOW (ref 4.22–5.81)
RDW: 13.6 % (ref 11.5–15.5)
WBC: 6.6 10*3/uL (ref 4.0–10.5)

## 2023-11-10 LAB — LIPID PANEL
Cholesterol: 186 mg/dL (ref 0–200)
HDL: 68.4 mg/dL (ref >39.00)
LDL Cholesterol: 100 mg/dL — ABNORMAL HIGH (ref 0–99)
NonHDL: 117.26
Total CHOL/HDL Ratio: 3
Triglycerides: 87 mg/dL (ref 0.0–149.0)
VLDL: 17.4 mg/dL (ref 0.0–40.0)

## 2023-11-10 LAB — COMPREHENSIVE METABOLIC PANEL
ALT: 16 U/L (ref 0–53)
AST: 23 U/L (ref 0–37)
Albumin: 4.4 g/dL (ref 3.5–5.2)
Alkaline Phosphatase: 75 U/L (ref 39–117)
BUN: 20 mg/dL (ref 6–23)
CO2: 27 meq/L (ref 19–32)
Calcium: 9.8 mg/dL (ref 8.4–10.5)
Chloride: 109 meq/L (ref 96–112)
Creatinine, Ser: 1.02 mg/dL (ref 0.40–1.50)
GFR: 69.54 mL/min (ref >60.00)
Glucose, Bld: 87 mg/dL (ref 70–99)
Potassium: 4 meq/L (ref 3.5–5.1)
Sodium: 142 meq/L (ref 135–145)
Total Bilirubin: 0.6 mg/dL (ref 0.2–1.2)
Total Protein: 6.9 g/dL (ref 6.0–8.3)

## 2023-11-10 LAB — TSH: TSH: 2.64 u[IU]/mL (ref 0.35–5.50)

## 2023-11-10 MED ORDER — AMLODIPINE BESYLATE 5 MG PO TABS: 5.0000 mg | ORAL_TABLET | Freq: Every day | ORAL | 3 refills | Status: AC

## 2023-11-10 MED ORDER — SIMVASTATIN 20 MG PO TABS: 20.0000 mg | ORAL_TABLET | Freq: Every day | ORAL | 3 refills | Status: DC

## 2023-11-10 NOTE — Progress Notes (Signed)
Established Patient Office Visit  Subjective   Patient ID: Jason [name], male    DOB: 06-14-1941  Age: 82 y.o. MRN: [id number]  Chief Complaint  Patient presents with   Annual Exam    HPI   Mr. Garthwaite is seen today accompanied by wife for medical follow-up.  Generally doing well.  He had a fall with hip fracture requiring surgery about a year and a half ago.  Getting around fairly well.  No recent falls.  He is followed regularly by psychiatry and neurology.  He has history of hypertension, obstructive sleep apnea, osteoarthritis, cognitive impairment, recurrent depression, hyperlipidemia.  Currently maintained on Aricept and Namenda  He remains on amlodipine 5 mg daily for hypertension.  Takes simvastatin 20 mg daily for hyperlipidemia.  Denies any myalgias.  Overdue for lipids.  He is on lithium per psychiatry with no recent TSH.  Has already had flu vaccine.  Also completed prior pneumonia vaccinations.  Past Medical History:  Diagnosis Date   Anxiety    Atrial fibrillation (HCC)    past hx   Colon polyp    Depression    GERD (gastroesophageal reflux disease)    Gout, unspecified    Hypertension    Osteoarthrosis, unspecified whether generalized or localized, unspecified site    Pneumonia, organism unspecified(486)    Pure hypercholesterolemia    Routine general medical examination at a health care facility    Sleep apnea    not using cpap currently   Unspecified nonpsychotic mental disorder    Past Surgical History:  Procedure Laterality Date   COLONOSCOPY  10/04/2006   jacobs   TOTAL HIP ARTHROPLASTY Right 03/12/2022   Procedure: TOTAL HIP ARTHROPLASTY ANTERIOR APPROACH;  Surgeon: Samson Frederic, MD;  Location: [facility];  Service: Orthopedics;  Laterality: Right;   UPPER GASTROINTESTINAL ENDOSCOPY  2015   WISDOM TOOTH EXTRACTION      reports that he has never smoked. He has never used smokeless tobacco. He reports that he does not drink alcohol and does not use  drugs. family history includes Arthritis in an other family member; Dementia in his mother; Heart failure in his father; Pneumonia in his father. No Known Allergies  Review of Systems  Constitutional:  Negative for chills and fever.  Eyes:  Negative for blurred vision.  Respiratory:  Negative for shortness of breath.   Cardiovascular:  Negative for chest pain.  Musculoskeletal:  Negative for myalgias.  Neurological:  Negative for dizziness, weakness and headaches.      Objective:     BP 110/74 (BP Location: Left Arm, Patient Position: Sitting, Cuff Size: Normal)   Pulse 82   Temp 98 F (36.7 C) (Oral)   Ht 6' 1.62" (1.87 m)   Wt 211 lb 3.2 oz (95.8 kg)   SpO2 98%   BMI 27.40 kg/m  BP Readings from Last 3 Encounters:  11/10/23 110/74  09/06/23 118/64  02/02/23 126/77   Wt Readings from Last 3 Encounters:  11/10/23 211 lb 3.2 oz (95.8 kg)  09/06/23 208 lb (94.3 kg)  02/01/23 199 lb (90.3 kg)      Physical Exam Vitals reviewed.  Constitutional:      General: He is not in acute distress.    Appearance: He is well-developed.  Eyes:     Pupils: Pupils are equal, round, and reactive to light.  Neck:     Thyroid: No thyromegaly.  Cardiovascular:     Rate and Rhythm: Normal rate and regular rhythm.  Pulmonary:  Effort: Pulmonary effort is normal. No respiratory distress.     Breath sounds: Normal breath sounds. No wheezing or rales.  Musculoskeletal:     Cervical back: Neck supple.     Right lower leg: No edema.     Left lower leg: No edema.  Neurological:     Mental Status: He is alert and oriented to person, place, and time.      No results found for any visits on 11/10/23.  Last CBC Lab Results  Component Value Date   WBC 6.9 02/02/2023   HGB 12.7 (L) 02/02/2023   HCT 37.6 (L) 02/02/2023   MCV 94.9 02/02/2023   MCH 32.1 02/02/2023   RDW 13.3 02/02/2023   PLT 173 02/02/2023   Last metabolic panel Lab Results  Component Value Date   GLUCOSE 112  (H) 02/02/2023   NA 135 02/02/2023   K 3.7 02/02/2023   CL 103 02/02/2023   CO2 23 02/02/2023   BUN 16 02/02/2023   CREATININE 0.92 02/02/2023   GFRNONAA >60 02/02/2023   CALCIUM 9.9 02/02/2023   PHOS 2.2 (L) 03/14/2022   PROT 6.7 02/02/2023   ALBUMIN 4.3 02/02/2023   BILITOT 0.5 02/02/2023   ALKPHOS 46 02/02/2023   AST 21 02/02/2023   ALT 16 02/02/2023   ANIONGAP 9 02/02/2023   Last lipids Lab Results  Component Value Date   CHOL 169 02/02/2022   HDL 80.80 02/02/2022   LDLCALC 78 02/02/2022   TRIG 51.0 02/02/2022   CHOLHDL 2 02/02/2022   Last thyroid functions Lab Results  Component Value Date   TSH 3.44 02/02/2022      The ASCVD Risk score (Arnett DK, et al., 2019) failed to calculate for the following reasons:   The 2019 ASCVD risk score is only valid for ages 57 to 75    Assessment & Plan:   #1 hypertension well-controlled on amlodipine 5 mg daily.  Refill amlodipine for 1 year.  Continue low-sodium diet.  #2 hyperlipidemia treated with simvastatin 20 mg daily.  Recheck lipid and CMP.  Refill simvastatin for 1 year.  Continue low saturated fat diet  #3 chronic lithium therapy.  Followed by psychiatry.  No recent TSH.  Will check this with labs today   Return in about 1 year (around 11/09/2024).    Evelena Peat, MD

## 2023-12-07 ENCOUNTER — Encounter: Payer: Self-pay | Admitting: Psychiatry

## 2023-12-07 ENCOUNTER — Ambulatory Visit (INDEPENDENT_AMBULATORY_CARE_PROVIDER_SITE_OTHER): Payer: Medicare Other | Admitting: Psychiatry

## 2023-12-07 DIAGNOSIS — F02A0 Dementia in other diseases classified elsewhere, mild, without behavioral disturbance, psychotic disturbance, mood disturbance, and anxiety: Secondary | ICD-10-CM

## 2023-12-07 DIAGNOSIS — F411 Generalized anxiety disorder: Secondary | ICD-10-CM | POA: Diagnosis not present

## 2023-12-07 DIAGNOSIS — G301 Alzheimer's disease with late onset: Secondary | ICD-10-CM | POA: Diagnosis not present

## 2023-12-07 DIAGNOSIS — F422 Mixed obsessional thoughts and acts: Secondary | ICD-10-CM | POA: Diagnosis not present

## 2023-12-07 DIAGNOSIS — F3342 Major depressive disorder, recurrent, in full remission: Secondary | ICD-10-CM

## 2023-12-07 NOTE — Progress Notes (Signed)
Jason [name] [id number] 08-04-1941 82 y.o.  Subjective:   Patient ID:  Jason [name] is a 82 y.o. (DOB Sep 13, 1941) male.  Chief Complaint:  Chief Complaint  Patient presents with   Follow-up   Memory Loss   Depression    Depression        Associated symptoms include no decreased concentration and no suicidal ideas.  Past medical history includes anxiety.   Anxiety Symptoms include nervous/anxious behavior. Patient reports no chest pain, confusion, decreased concentration, palpitations or suicidal ideas.       Jason [name] presents to the office today for follow-up of MDE, OCD and GAD.  seen August, 2020.  No meds were changed.  5/5//21  Seen with wife Jason [name]. Had mild Covid in fall 2020 and got the vaccine.   CO forgetfulness with names and places but not getting words out correctly.   Wife notices the same but she's not overly concerned but she has noticed occ gets confused about people. PCP checked b12 normal bc pt brought up memory concern.   11/16/20 appt with following noted:  Seen with wife Jason [name] Still doing well. Occ spellls of anxiety randomly.  Does better if doesn't forget meds. No SE.  Memory trouble limited mainly to names.  Not much worse.  OK otherwise. Depression can occur but only last a day.  Depression and anxiety under control.  Except notices Feb each year a change in disposition but it's minor.  Patient reports stable mood and denies depressed or irritable moods.   Patient denies difficulty with sleep initiation or maintenance but it is split with some napping.  EMA. Denies appetite disturbance.  Patient reports that energy and motivation have been good.  Patient denies any difficulty with concentration.  Patient denies any suicidal ideation. Stress caring for inlaws in their 90s.  F good cognition.  M cognitive problems. Plan: Patient's lithium level was 0.51-year ago on 600 mg daily.  Level on 11/28/2020 was 0.9 on 600 mg daily.  The lithium level has crept up a  little bit.  I am concerned it may start causing side effects.  Therefore a prescription has been sent so that he can reduce the dose to 450 mg daily by using 3 of the 150 mg capsules.  06/10/2021 appointment with the following noted: seen with wife Doing fine and no problems with reduction in lithium to 450 mg daily. Frustration and down with world and politics and can interfere with sleep lately better. Staying up later and more irregular pattern.  Will nap at times. Questions about meds Plan no changes  12/09/21 appt noted: seen with wife Sertraline 200 mg daily and lithium 450 mg and donepezil 10 mg HS, Cerefolin NAC Consistent with meds.  No SE Doing well for 82 yo. Patient reports stable mood and denies depressed or irritable moods.  Patient denies any recent difficulty with anxiety.  Patient denies difficulty with sleep initiation or maintenance. Denies appetite disturbance.  Patient reports that energy and motivation have been good.  Patient denies any difficulty with concentration.  Patient denies any suicidal ideation. Helping homeless families. Plan: High relapse risk if reduce meds.  But rarely high dose sertraline can interfere with cognition but he's at risk with reduction. No med changes. Continue lithium 450 mg daily Sertraline 200 Aricept 10  06/06/22 appt noted: seen with wife per usual Doing fine except fell off porch and hip fx with replacement since here. Recovery has gone well.  After surgery in March.  Finished PT at home and now some outpt PT. Dr. Loreli Dollar.  Pleased with him. Not depressed.  Patient reports stable mood and denies depressed or irritable moods.  Patient denies any recent difficulty with anxiety.  Patient denies difficulty with sleep initiation or maintenance. Denies appetite disturbance.  Patient reports that energy and motivation have been good.  Patient has mild forgetfulness. Patient denies any suicidal ideation. No SE problems.  12/06/22 appt  noted:  seen with wife Good I most respects. Still some memory issues and tries to work on it and pay attention better. Not losing stuff.   Saw Dr. Tomma Lightning dx MCI  MMSE 23/30.  Plans neuropsych testing in Feb [facility], [place] Crandall.  Also sched MRI 09/21/22 mild mod atrophy Satisfied with meds.   Not dep. Momentary anxiety but not persistent.  No panic. Sleep is best it's been at the moment.  More effort to improve it.  Was going to sleep too late.   No problems with meds. Plan: No med changes. Continue lithium 450 mg daily Sertraline 200.  Option reduce Aricept 10 Continue Vitamin D with level in 50s Check lithium level  6/12/224 appt noted:  seen with wife.   I think I'm doing good.  Except forgetful esp names.  Fights off the forgetfulness.   He thinks it is better than it was.   Not depressed.  Anxiety ok.   Continues meds including Cerefolin NAC. No SE with meds.  Except poss tremor.   No concerns with meds.   DX mild OSA previously without benefit from CPAP .  Lost wt since then. Wife notices if several things going on then pt has trouble keeping up with them.  Plan: start Namenda trial Continue lithium 450 mg daily Sertraline 200.  Option reduce Aricept 10 Continue Vitamin D with level in 50s  12/07/23 appt noted:  seen with wife. Meds as above including memantine 10 BID, donepezil 10, sertraline 200, lithium 450 HS. Doing pretty well.  No sig dep nor anxiety.  Can get anxious under stress.  Enjoy having something to do.   Still memory problems.  Names and restaurants and new things.  Can forget names of meds.   Dr. Caryl Never checked TSH normal. Sleep is ok.   Dr. Windell Norfolk [facility] 09/06/23 MMSE =19,09/06/22 MMSE 23   Past Psychiatric Medication Trials: Wellbutrin side effects, Lexapro nausea,  paroxetine, fluoxetine briefly, duloxetine, Effexor with a history of benefit, mirtazapine, Pristiq 100, sertraline 200,  Abilify,  buspirone side effects,  lithium 600, olanzapine 2.5 with benefit. Aricept , memantine 10 BID ECT  Review of Systems:  Review of Systems  HENT:  Positive for hearing loss.   Respiratory:  Negative for chest tightness.   Cardiovascular:  Negative for chest pain and palpitations.  Musculoskeletal:  Positive for arthralgias and back pain.  Neurological:  Negative for tremors.  Psychiatric/Behavioral:  Negative for agitation, behavioral problems, confusion, decreased concentration, dysphoric mood, hallucinations, self-injury, sleep disturbance and suicidal ideas. The patient is nervous/anxious. The patient is not hyperactive.     Medications: I have reviewed the patient's current medications.  Current Outpatient Medications  Medication Sig Dispense Refill   amLODipine (NORVASC) 5 MG tablet Take 1 tablet (5 mg total) by mouth daily. 90 tablet 3   donepezil (ARICEPT) 10 MG tablet TAKE 1 TABLET BY MOUTH EVERYDAY AT BEDTIME 90 tablet 3   ferrous sulfate 325 (65 FE) MG tablet Take 325 mg by mouth daily with breakfast.  lithium carbonate 150 MG capsule TAKE 3 CAPSULES (450 MG TOTAL) BY MOUTH DAILY. 270 capsule 1   memantine (NAMENDA) 10 MG tablet Take 1 tablet twice daily 180 tablet 1   Methylfol-Algae-B12-Acetylcyst (CEREFOLIN NAC) 6-90.314-2-600 MG TABS TAKE 1 TABLET BY MOUTH EVERY DAY 90 tablet 3   Multiple Vitamins-Minerals (MULTIVITAMIN ADULTS) TABS Take 1 tablet by mouth daily.     sertraline (ZOLOFT) 100 MG tablet Take 2 tablets (200 mg total) by mouth daily. 180 tablet 3   simvastatin (ZOCOR) 20 MG tablet Take 1 tablet (20 mg total) by mouth daily. 90 tablet 3   Vitamin D, Ergocalciferol, (DRISDOL) 1.25 MG (50000 UNIT) CAPS capsule TAKE 1 CAPSULE BY MOUTH ONE TIME PER WEEK 12 capsule 0   No current facility-administered medications for this visit.    Medication Side Effects: None except occ jerks from lithium  Allergies: No Known Allergies  Past Medical History:  Diagnosis Date   Anxiety    Atrial  fibrillation (HCC)    past hx   Colon polyp    Depression    GERD (gastroesophageal reflux disease)    Gout, unspecified    Hypertension    Osteoarthrosis, unspecified whether generalized or localized, unspecified site    Pneumonia, organism unspecified(486)    Pure hypercholesterolemia    Routine general medical examination at a health care facility    Sleep apnea    not using cpap currently   Unspecified nonpsychotic mental disorder     Family History  Problem Relation Age of Onset   Dementia Mother    Pneumonia Father    Heart failure Father    Arthritis Other    Colon cancer Neg Hx    Colon polyps Neg Hx    Esophageal cancer Neg Hx    Stomach cancer Neg Hx    Rectal cancer Neg Hx     Social History   Socioeconomic History   Marital status: Married    Spouse name: Jason [name]   Number of children: 0   Years of education: Not on file   Highest education level: Not on file  Occupational History   Occupation: retired    Associate Professor: [employer]  Tobacco Use   Smoking status: Never   Smokeless tobacco: Never  Vaping Use   Vaping status: Never Used  Substance and Sexual Activity   Alcohol use: No   Drug use: No   Sexual activity: Not on file  Other Topics Concern   Not on file  Social History Narrative   Right handed   Lives with wife   Caffeine-1-2 daily   Social Drivers of Health   Financial Resource Strain: Not on file  Food Insecurity: Not on file  Transportation Needs: Not on file  Physical Activity: Not on file  Stress: Not on file  Social Connections: Not on file  Intimate Partner Violence: Not on file    Past Medical History, Surgical history, Social history, and Family history were reviewed and updated as appropriate.   Please see review of systems for further details on the patient's review from today.   Objective:   Physical Exam:  There were no vitals taken for this visit.  Physical Exam Constitutional:      General: He is not in acute  distress.    Appearance: He is well-developed.  Musculoskeletal:        General: No deformity.  Neurological:     Mental Status: He is alert and oriented to person, place, and time.  Motor: No tremor.     Coordination: Coordination normal.  Psychiatric:        Attention and Perception: Attention normal. He is attentive. He does not perceive auditory hallucinations.        Mood and Affect: Mood normal. Mood is not anxious or depressed. Affect is not labile, blunt, angry, tearful or inappropriate.        Speech: Speech normal. Speech is not slurred.        Behavior: Behavior normal. Behavior is not slowed.        Thought Content: Thought content normal. Thought content is not delusional. Thought content does not include homicidal or suicidal ideation. Thought content does not include suicidal plan.        Cognition and Memory: Cognition normal. He exhibits impaired recent memory.        Judgment: Judgment normal.     Comments: Insight is fair to good. Positive affect     Lab Review:     Component Value Date/Time   NA 142 11/10/2023 1114   K 4.0 11/10/2023 1114   CL 109 11/10/2023 1114   CO2 27 11/10/2023 1114   GLUCOSE 87 11/10/2023 1114   BUN 20 11/10/2023 1114   CREATININE 1.02 11/10/2023 1114   CALCIUM 9.8 11/10/2023 1114   PROT 6.9 11/10/2023 1114   ALBUMIN 4.4 11/10/2023 1114   AST 23 11/10/2023 1114   ALT 16 11/10/2023 1114   ALKPHOS 75 11/10/2023 1114   BILITOT 0.6 11/10/2023 1114   GFRNONAA >60 02/02/2023 0128   GFRAA >60 04/19/2020 1247       Component Value Date/Time   WBC 6.6 11/10/2023 1114   RBC 4.04 (L) 11/10/2023 1114   HGB 13.0 11/10/2023 1114   HCT 39.3 11/10/2023 1114   PLT 218.0 11/10/2023 1114   MCV 97.2 11/10/2023 1114   MCH 32.1 02/02/2023 0128   MCHC 33.1 11/10/2023 1114   RDW 13.6 11/10/2023 1114   LYMPHSABS 2.0 11/10/2023 1114   MONOABS 0.6 11/10/2023 1114   EOSABS 0.3 11/10/2023 1114   BASOSABS 0.0 11/10/2023 1114   Normal TSH in  September.  Last lithium 0.8 in July 2019  Lithium level January 10, 2027 0.5  Lithium Lvl  Date Value Ref Range Status  01/31/2023 0.6 0.6 - 1.2 mmol/L Final   01/04/22 lithium level 0.6 on 450 mg daily.  No results found for: "PHENYTOIN", "PHENOBARB", "VALPROATE", "CBMZ"   .res Assessment: Plan:    Recurrent major depression in complete remission (HCC)  Mixed obsessional thoughts and acts  Generalized anxiety disorder  Mild cognitive impairment   Mr. Eatherly has a long history of the above diagnoses which have been treatment resistant at times and multiple relapses.  He ultimately responded to the combination of sertraline and lithium and Zyprexa.  He has been able to wean off the Zyprexa without complication.  He's continuing to do well.  As noted multiple failed meds and a history of treatment resistance. Each diagnosis under control and meds helping.   Extensive disucussion of sleep hygiene.  Also has nocturia.  Agree with pursuing hearing aids given cognitive concerns.  Then consider neuro work up.  Supportive therapy dealing with sick in-laws and caretaking.  Encourage self-care and recreation.  Also dealing with homeless people.  Counseled patient regarding potential benefits, risks, and side effects of lithium to include potential risk of lithium affecting thyroid and renal function.  Discussed need for periodic lab monitoring to determine drug level and to assess for potential  adverse effects.  Counseled patient regarding signs and symptoms of lithium toxicity and advised that they notify office immediately or seek urgent medical attention if experiencing these signs and symptoms.  Patient advised to contact office with any questions or concerns.  Call if any changes in BP meds.  Disc DDI  Lithium level is in the low normal range and is typical for the levels he has had over the last couple of years.  Labs good.  Disc risk s and TSH also normal. Has normal D on 50K weekly.  and B12 levels Reduced lithium from 600 to 450 Dec 2021 bc Cr creep without problems. 03/14/22 Cr. 0.8. Continue lithium to AM to see if nocturia is helped.  01/04/22 lithium level 0.6 on 450 mg daily. 01/31/23 Lithium 0.6  on 450 mg daily [facility]he[facility] at [facility].  Check level every 6 mos  Disc the difference between Cerefolin NAC and Metafolbic plus angain in detail.Marland Kitchen option switch to NAC & B complex  Disc donepezil 10 mg nightly.   Neuro workup normal and neuropsych testing with the following results per Dr. Windell Norfolk, [facility]. Results of neuropsychological testing were deemed valid and interpretable.  They reveal significant cognitive impairment across multiple domains, most notably affecting frontal executive function and processing speed.  Memory was also substantially impacted, characterized by his shallow encoding, poor spontaneous retrieval, and limited benefit from cues.  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  These findings suggest probable frontal subcortical dysfunction, potentially compounded by early stage of Alzheimer disease.  Disc these results with pt and his wife at their request.  Disc need to get hearing aids and how they might help cognition or slow cognitive decline.  Option Namenda  High relapse risk if reduce meds.  But rarely high dose sertraline can interfere with cognition but he's at risk with reduction. Continue Namenda 10 BID Continue lithium 450 mg daily Sertraline 200.  Option reduce Aricept 10 Continue Vitamin D with level in 50s  This appt was 30 mins.  FU 6 mos  Meredith Staggers, MD, DFAPA     Please see After Visit Summary for patient specific instructions.  No future appointments.    No orders of the defined types were placed in this encounter.     -------------------------------

## 2024-01-16 ENCOUNTER — Other Ambulatory Visit: Payer: Self-pay

## 2024-01-16 ENCOUNTER — Emergency Department (HOSPITAL_COMMUNITY): Payer: Medicare Other

## 2024-01-16 ENCOUNTER — Emergency Department (HOSPITAL_COMMUNITY)
Admission: EM | Admit: 2024-01-16 | Discharge: 2024-01-16 | Disposition: A | Discharge status: Home / Self Care | Payer: Medicare Other | Attending: Emergency Medicine | Admitting: Emergency Medicine

## 2024-01-16 DIAGNOSIS — I10 Essential (primary) hypertension: Secondary | ICD-10-CM | POA: Diagnosis not present

## 2024-01-16 DIAGNOSIS — Z79899 Other long term (current) drug therapy: Secondary | ICD-10-CM | POA: Diagnosis not present

## 2024-01-16 DIAGNOSIS — R059 Cough, unspecified: Secondary | ICD-10-CM | POA: Diagnosis not present

## 2024-01-16 DIAGNOSIS — Z20822 Contact with and (suspected) exposure to covid-19: Secondary | ICD-10-CM | POA: Diagnosis not present

## 2024-01-16 DIAGNOSIS — R531 Weakness: Secondary | ICD-10-CM | POA: Diagnosis not present

## 2024-01-16 DIAGNOSIS — J210 Acute bronchiolitis due to respiratory syncytial virus: Secondary | ICD-10-CM | POA: Diagnosis not present

## 2024-01-16 DIAGNOSIS — R0689 Other abnormalities of breathing: Secondary | ICD-10-CM | POA: Diagnosis not present

## 2024-01-16 DIAGNOSIS — R0902 Hypoxemia: Secondary | ICD-10-CM | POA: Diagnosis not present

## 2024-01-16 DIAGNOSIS — R42 Dizziness and giddiness: Secondary | ICD-10-CM | POA: Diagnosis not present

## 2024-01-16 DIAGNOSIS — R0989 Other specified symptoms and signs involving the circulatory and respiratory systems: Secondary | ICD-10-CM | POA: Diagnosis not present

## 2024-01-16 DIAGNOSIS — R55 Syncope and collapse: Secondary | ICD-10-CM | POA: Diagnosis not present

## 2024-01-16 LAB — CBC WITH DIFFERENTIAL/PLATELET
Abs Immature Granulocytes: 0.02 10*3/uL (ref 0.00–0.07)
Basophils Absolute: 0 10*3/uL (ref 0.0–0.1)
Basophils Relative: 0 %
Eosinophils Absolute: 0 10*3/uL (ref 0.0–0.5)
Eosinophils Relative: 0 %
HCT: 37.5 % — ABNORMAL LOW (ref 39.0–52.0)
Hemoglobin: 12.4 g/dL — ABNORMAL LOW (ref 13.0–17.0)
Immature Granulocytes: 0 %
Lymphocytes Relative: 6 %
Lymphs Abs: 0.5 10*3/uL — ABNORMAL LOW (ref 0.7–4.0)
MCH: 32.4 pg (ref 26.0–34.0)
MCHC: 33.1 g/dL (ref 30.0–36.0)
MCV: 97.9 fL (ref 80.0–100.0)
Monocytes Absolute: 0.7 10*3/uL (ref 0.1–1.0)
Monocytes Relative: 8 %
Neutro Abs: 7.4 10*3/uL (ref 1.7–7.7)
Neutrophils Relative %: 86 %
Platelets: 174 10*3/uL (ref 150–400)
RBC: 3.83 MIL/uL — ABNORMAL LOW (ref 4.22–5.81)
RDW: 12.9 % (ref 11.5–15.5)
WBC: 8.7 10*3/uL (ref 4.0–10.5)
nRBC: 0 % (ref 0.0–0.2)

## 2024-01-16 LAB — COMPREHENSIVE METABOLIC PANEL
ALT: 17 U/L (ref 0–44)
AST: 28 U/L (ref 15–41)
Albumin: 3.6 g/dL (ref 3.5–5.0)
Alkaline Phosphatase: 56 U/L (ref 38–126)
Anion gap: 13 (ref 5–15)
BUN: 16 mg/dL (ref 8–23)
CO2: 22 mmol/L (ref 22–32)
Calcium: 9.3 mg/dL (ref 8.9–10.3)
Chloride: 102 mmol/L (ref 98–111)
Creatinine, Ser: 1.07 mg/dL (ref 0.61–1.24)
GFR, Estimated: >60 mL/min (ref >60)
Glucose, Bld: 154 mg/dL — ABNORMAL HIGH (ref 70–99)
Potassium: 4 mmol/L (ref 3.5–5.1)
Sodium: 137 mmol/L (ref 135–145)
Total Bilirubin: 0.8 mg/dL (ref 0.0–1.2)
Total Protein: 6.5 g/dL (ref 6.5–8.1)

## 2024-01-16 LAB — I-STAT CHEM 8, ED
BUN: 20 mg/dL (ref 8–23)
Calcium, Ion: 1.2 mmol/L (ref 1.15–1.40)
Chloride: 103 mmol/L (ref 98–111)
Creatinine, Ser: 1 mg/dL (ref 0.61–1.24)
Glucose, Bld: 145 mg/dL — ABNORMAL HIGH (ref 70–99)
HCT: 37 % — ABNORMAL LOW (ref 39.0–52.0)
Hemoglobin: 12.6 g/dL — ABNORMAL LOW (ref 13.0–17.0)
Potassium: 4.3 mmol/L (ref 3.5–5.1)
Sodium: 136 mmol/L (ref 135–145)
TCO2: 25 mmol/L (ref 22–32)

## 2024-01-16 LAB — RESP PANEL BY RT-PCR (RSV, FLU A&B, COVID)  RVPGX2
Influenza A by PCR: NEGATIVE
Influenza B by PCR: NEGATIVE
Resp Syncytial Virus by PCR: POSITIVE — AB
SARS Coronavirus 2 by RT PCR: NEGATIVE

## 2024-01-16 LAB — CBG MONITORING, ED: Glucose-Capillary: 113 mg/dL — ABNORMAL HIGH (ref 70–99)

## 2024-01-16 LAB — TROPONIN I (HIGH SENSITIVITY): Troponin I (High Sensitivity): 10 ng/L (ref <18)

## 2024-01-16 MED ORDER — SODIUM CHLORIDE 0.9 % IV BOLUS
500.0000 mL | Freq: Once | INTRAVENOUS | Status: AC
  Administered 2024-01-16: 500 mL via INTRAVENOUS

## 2024-01-16 NOTE — ED Triage Notes (Signed)
Pt BIBA from home with c/o generalized weakness and fall. EMS states pt was walking out of the bathroom and felt their legs go out underneath them. Denies any dizziness prior to fall, denies any pain or injuries, not on thinners. Endorses cough, runny nose and fever. Aox4. RA 88%, placed on [facility] by EMS. Denies CP or SHOB.

## 2024-01-16 NOTE — Discharge Instructions (Addendum)
Please follow-up with your PCP to ensure resolution. Your PCR testing was positive for RSV which is a respiratory virus. No evidence of pneumonia on your CXR. Return to the ED if you have worsening symptoms. Continue supportive care with rehydration and Tylenol for fever at home

## 2024-01-16 NOTE — ED Provider Notes (Addendum)
  Physical Exam  BP 116/74 (BP Location: Right Arm)   Pulse 82   Temp 98.9 F (37.2 C) (Oral)   Resp 19   Ht 6\' 2"  (1.88 m)   Wt 90.7 kg   SpO2 94%   BMI 25.68 kg/m     Procedures  Procedures  ED Course / MDM   Clinical Course as of 01/16/24 0908  Tue Jan 16, 2024  0732 Respiratory Syncytial Virus by PCR(!): POSITIVE [JL]    Clinical Course User Index [JL] Ernie Avena, MD   Medical Decision Making Amount and/or Complexity of Data Reviewed Labs: ordered. Decision-making details documented in ED Course. Radiology: ordered.  Risk Decision regarding hospitalization.   58M, hx of dementia, fell to the ground, + orthostatics, + RSV. 500cc IVF bolus ordered. Needs to ambulate. Low threshold to admit.   The patient was ambulated on pulse oximetry and desaturated to 91% on room air.  In the setting of his RSV infection, hospitalist medicine was consulted for admission for obersvation. Discussed with on-call hospitalist medicine, Dr. Pola Corn. The patient is saturating 95-99% on room air. On further review, his orthostatics were negative. Considered admission for observation however on repeat evaluation, the patient continued to saturate well on room air, he felt symptomatically improved following a fluid bolus. Hospitalist medicine also did not feel that patient needed to be admitted.  Recommended continued supportive care and outpatient follow-up, return precautions provided, discussed with family bedside who were comfortable with the plan of care. Stable for discharge.         Ernie Avena, MD 01/16/24 4696    Ernie Avena, MD 01/16/24 1004

## 2024-01-16 NOTE — ED Notes (Signed)
Patients oxygen 91% on room air while ambulating down the hall

## 2024-01-16 NOTE — ED Provider Notes (Signed)
[place] EMERGENCY DEPARTMENT AT [facility] Provider Note   CSN: [id number] Arrival date & time: 01/16/24  0459     History  Chief Complaint  Patient presents with   Weakness    Jason [name] is a 83 y.o. male.  The history is provided by the patient, the spouse and medical records.  Weakness Jason [name] is a 83 y.o. male who presents to the Emergency Department complaining of weakness.  He presents to the emergency department by EMS from home for evaluation of weakness and a near syncopal event.  He has been sick for the last week with runny nose, cough.  Today he felt feverish but did not take an oral temperature.  Around 4 AM he was getting up to use the bathroom and he became weak, dizzy and lightheaded and he fell to the ground and hit his head.  He did not fully lose consciousness.  He denies any chest pain, abdominal pain.  He has a history of hypertension, remote history of A-fib, hyperlipidemia.    Home Medications Prior to Admission medications   Medication Sig Start Date End Date Taking? Authorizing Provider  amLODipine (NORVASC) 5 MG tablet Take 1 tablet (5 mg total) by mouth daily. 11/10/23   Burchette, Elberta Fortis, MD  [employer] DICLOFENAC SODIUM 1 % GEL Apply 2 g topically 4 (four) times daily. 10/18/23   [provider]  donepezil (ARICEPT) 10 MG tablet TAKE 1 TABLET BY MOUTH EVERYDAY AT BEDTIME 07/21/23   Cottle, Steva Ready., MD  ferrous sulfate 325 (65 FE) MG tablet Take 325 mg by mouth daily with breakfast.    Burchette, Elberta Fortis, MD  lithium carbonate 150 MG capsule TAKE 3 CAPSULES (450 MG TOTAL) BY MOUTH DAILY. 06/25/23   Lauraine Rinne., MD  memantine (NAMENDA) 10 MG tablet Take 1 tablet twice daily 07/02/23   Cottle, Steva Ready., MD  Methylfol-Algae-B12-Acetylcyst (CEREFOLIN NAC) 6-90.314-2-600 MG TABS TAKE 1 TABLET BY MOUTH EVERY DAY 09/13/23   Cottle, Steva Ready., MD  Multiple Vitamins-Minerals (MULTIVITAMIN ADULTS) TABS Take 1 tablet by mouth daily.     [provider]  sertraline (ZOLOFT) 100 MG tablet Take 2 tablets (200 mg total) by mouth daily. 12/06/22   Cottle, Steva Ready., MD  simvastatin (ZOCOR) 20 MG tablet Take 1 tablet (20 mg total) by mouth daily. 11/10/23   Burchette, Elberta Fortis, MD  Vitamin D, Ergocalciferol, (DRISDOL) 1.25 MG (50000 UNIT) CAPS capsule TAKE 1 CAPSULE BY MOUTH ONE TIME PER WEEK 10/27/23   Burchette, Elberta Fortis, MD      Allergies    Patient has no known allergies.    Review of Systems   Review of Systems  Neurological:  Positive for weakness.  All other systems reviewed and are negative.   Physical Exam Updated Vital Signs BP 116/74 (BP Location: Right Arm)   Pulse 82   Temp 98.9 F (37.2 C) (Oral)   Resp 19   Ht 6\' 2"  (1.88 m)   Wt 90.7 kg   SpO2 94%   BMI 25.68 kg/m  Physical Exam Vitals and nursing note reviewed.  Constitutional:      Appearance: He is well-developed.  HENT:     Head: Normocephalic and atraumatic.  Cardiovascular:     Rate and Rhythm: Normal rate and regular rhythm.     Heart sounds: No murmur heard. Pulmonary:     Effort: Pulmonary effort is normal. No respiratory distress.  Comments: Occasional rhonchi and crackles in the bases Abdominal:     Palpations: Abdomen is soft.     Tenderness: There is no abdominal tenderness. There is no guarding or rebound.  Musculoskeletal:        General: No swelling or tenderness.  Skin:    General: Skin is warm and dry.  Neurological:     Mental Status: He is alert and oriented to person, place, and time.  Psychiatric:        Behavior: Behavior normal.     ED Results / Procedures / Treatments   Labs (all labs ordered are listed, but only abnormal results are displayed) Labs Reviewed  RESP PANEL BY RT-PCR (RSV, FLU A&B, COVID)  RVPGX2 - Abnormal; Notable for the following components:      Result Value   Resp Syncytial Virus by PCR POSITIVE (*)    All other components within normal limits  COMPREHENSIVE METABOLIC  PANEL - Abnormal; Notable for the following components:   Glucose, Bld 154 (*)    All other components within normal limits  CBC WITH DIFFERENTIAL/PLATELET - Abnormal; Notable for the following components:   RBC 3.83 (*)    Hemoglobin 12.4 (*)    HCT 37.5 (*)    Lymphs Abs 0.5 (*)    All other components within normal limits  CBG MONITORING, ED - Abnormal; Notable for the following components:   Glucose-Capillary 113 (*)    All other components within normal limits  BRAIN NATRIURETIC PEPTIDE  I-STAT CHEM 8, ED  TROPONIN I (HIGH SENSITIVITY)    EKG EKG Interpretation Date/Time:  Tuesday January 16 2024 06:11:20 EST Ventricular Rate:  82 PR Interval:  193 QRS Duration:  104 QT Interval:  460 QTC Calculation: 538 R Axis:   69  Text Interpretation: Sinus rhythm Supraventricular bigeminy Low voltage, precordial leads Borderline T wave abnormalities Prolonged QT interval Confirmed by Tilden Fossa [phone]) on 01/16/2024 6:16:33 AM  Radiology DG Chest Port 1 View Result Date: 01/16/2024 CLINICAL DATA:  Coughing, runny nose, near syncopal episode, favor and general weakness. EXAM: PORTABLE CHEST 1 VIEW COMPARISON:  Portable chest 03/12/2022. FINDINGS: The heart is slightly enlarged. No vascular congestion is seen. Stable mediastinum with mild aortic tortuosity and calcific plaques. The lungs are clear. No pleural effusion is evident. Thoracic cage is intact with osteopenia. Multiple overlying monitor wires. IMPRESSION: No evidence of acute chest disease. Slight cardiomegaly. Aortic atherosclerosis. Electronically Signed   By: Almira Bar M.D.   On: 01/16/2024 07:01    Procedures Procedures    Medications Ordered in ED Medications - No data to display  ED Course/ Medical Decision Making/ A&P                                 Medical Decision Making Amount and/or Complexity of Data Reviewed Labs: ordered. Radiology: ordered.   Patient with history of hypertension here for  evaluation of weakness, near syncopal episode.  He has mild global weakness on examination.  He does have rhonchi on lung exam without respiratory distress.  He did have some hypoxia for EMS but he does not have any in the emergency department.  He is mildly orthostatic.  He was treated with IV fluids.  Chest x-ray is negative for acute abnormality-images personally reviewed and interpreted, agree with radiologist interpretation.  He is positive for RSV.  Patient care transferred pending reassessment after IV fluids  Final Clinical Impression(s) / ED Diagnoses Final diagnoses:  None    Rx / DC Orders ED Discharge Orders     None         Tilden Fossa, MD 01/16/24 [phone]

## 2024-01-19 ENCOUNTER — Other Ambulatory Visit: Payer: Self-pay | Admitting: Family Medicine

## 2024-01-23 ENCOUNTER — Telehealth: Payer: Self-pay

## 2024-01-23 NOTE — Progress Notes (Signed)
Transition Care Management Follow-up Telephone Call Date of discharge and from where: 01/16/2024 [facility] How have you been since you were released from the hospital? Patient is feeling better but still has a chronic cough and wheezing. Any questions or concerns? No  Items Reviewed: Did the pt receive and understand the discharge instructions provided? Yes  Medications obtained and verified?  No medication prescribed patient is using OTC Mucinex. Other? No  Any new allergies since your discharge? No  Dietary orders reviewed? Yes Do you have support at home? Yes   Follow up appointments reviewed:  PCP Hospital f/u appt confirmed?  Patient will call PCP to follow up.  Scheduled to see  on  @ . Specialist Hospital f/u appt confirmed? No  Scheduled to see  on  @ . Are transportation arrangements needed? No  If their condition worsens, is the pt aware to call PCP or go to the Emergency Dept.? Yes Was the patient provided with contact information for the PCP's office or ED? Yes Was to pt encouraged to call back with questions or concerns? Yes   Jason [name] [facility]  [facility] Guide Direct Dial: [phone]  Fax: [phone] Website: [email or web address]

## 2024-01-26 ENCOUNTER — Encounter: Payer: Self-pay | Admitting: Family Medicine

## 2024-01-26 ENCOUNTER — Ambulatory Visit (INDEPENDENT_AMBULATORY_CARE_PROVIDER_SITE_OTHER): Payer: Medicare Other | Admitting: Family Medicine

## 2024-01-26 VITALS — BP 106/60 | HR 88 | Temp 98.2°F | Wt 204.5 lb

## 2024-01-26 DIAGNOSIS — I517 Cardiomegaly: Secondary | ICD-10-CM | POA: Diagnosis not present

## 2024-01-26 DIAGNOSIS — J210 Acute bronchiolitis due to respiratory syncytial virus: Secondary | ICD-10-CM

## 2024-01-26 DIAGNOSIS — R0600 Dyspnea, unspecified: Secondary | ICD-10-CM

## 2024-01-26 NOTE — Progress Notes (Signed)
Established Patient Office Visit  Subjective   Patient ID: Jason [name], male    DOB: 07/30/1941  Age: 83 y.o. MRN: [id number]  Chief Complaint  Patient presents with   URI   Cough    Patient complains of cough, Productive, x2 weeks   Nasal Congestion    HPI   Jason [name] is seen today companied by wife for hospital follow-up.  He was seen there on the 21st and diagnosed with RSV.  He had onset of symptoms on the 17th of nasal congestion and thought this was a cold.  His symptoms worsened with some shortness of breath and wheezing.  His chest x-ray showed no pneumonia but did show some mild cardiomegaly.  Patient had become weak and at home and actually had brief syncopal episode which was felt to be orthostatic related.  He had CT head which showed no acute findings.  Initial O2 sat was around 91% but seem to improve some after IV fluids.  There was consideration to admit him for observation but he was satting well on observation and sent home.  Has gradually improved since then.  Still has some lingering cough but no active wheezing.  He does relate some chronic mild dyspnea with activity but no chest pains.  No recent chest x-rays for comparison.  No history of heart failure.  No orthopnea.  No recent peripheral edema.  Hospital studies including CAT scan, chest x-ray, labs all reviewed.  Lab work was essentially unremarkable.  Past Medical History:  Diagnosis Date   Anxiety    Atrial fibrillation (HCC)    past hx   Colon polyp    Depression    GERD (gastroesophageal reflux disease)    Gout, unspecified    Hypertension    Osteoarthrosis, unspecified whether generalized or localized, unspecified site    Pneumonia, organism unspecified(486)    Pure hypercholesterolemia    Routine general medical examination at a health care facility    Sleep apnea    not using cpap currently   Unspecified nonpsychotic mental disorder    Past Surgical History:  Procedure Laterality Date    COLONOSCOPY  10/04/2006   jacobs   TOTAL HIP ARTHROPLASTY Right 03/12/2022   Procedure: TOTAL HIP ARTHROPLASTY ANTERIOR APPROACH;  Surgeon: Samson Frederic, MD;  Location: [facility];  Service: Orthopedics;  Laterality: Right;   UPPER GASTROINTESTINAL ENDOSCOPY  2015   WISDOM TOOTH EXTRACTION      reports that he has never smoked. He has never used smokeless tobacco. He reports that he does not drink alcohol and does not use drugs. family history includes Arthritis in an other family member; Dementia in his mother; Heart failure in his father; Pneumonia in his father. No Known Allergies  Review of Systems  Constitutional:  Negative for chills and fever.  HENT:  Positive for congestion.   Respiratory:  Positive for cough. Negative for hemoptysis and wheezing.   Cardiovascular:  Negative for chest pain.  Neurological:  Negative for loss of consciousness.      Objective:     BP 106/60 (BP Location: Left Arm, Patient Position: Sitting, Cuff Size: Normal)   Pulse 88   Temp 98.2 F (36.8 C) (Oral)   Wt 204 lb 8 oz (92.8 kg)   SpO2 97%   BMI 26.26 kg/m  BP Readings from Last 3 Encounters:  01/26/24 106/60  01/16/24 (!) 121/93  11/10/23 110/74   Wt Readings from Last 3 Encounters:  01/26/24 204 lb 8 oz (92.8  kg)  01/16/24 200 lb (90.7 kg)  11/10/23 211 lb 3.2 oz (95.8 kg)      Physical Exam Vitals reviewed.  Constitutional:      Appearance: He is well-developed.  HENT:     Right Ear: External ear normal.     Left Ear: External ear normal.  Eyes:     Pupils: Pupils are equal, round, and reactive to light.  Neck:     Thyroid: No thyromegaly.  Cardiovascular:     Rate and Rhythm: Normal rate and regular rhythm.  Pulmonary:     Effort: Pulmonary effort is normal. No respiratory distress.     Breath sounds: Normal breath sounds. No wheezing or rales.  Musculoskeletal:     Cervical back: Neck supple.     Right lower leg: No edema.     Left lower leg: No edema.   Neurological:     Mental Status: He is alert and oriented to person, place, and time.      No results found for any visits on 01/26/24.  Last CBC Lab Results  Component Value Date   WBC 8.7 01/16/2024   HGB 12.6 (L) 01/16/2024   HCT 37.0 (L) 01/16/2024   MCV 97.9 01/16/2024   MCH 32.4 01/16/2024   RDW 12.9 01/16/2024   PLT 174 01/16/2024   Last metabolic panel Lab Results  Component Value Date   GLUCOSE 145 (H) 01/16/2024   NA 136 01/16/2024   K 4.3 01/16/2024   CL 103 01/16/2024   CO2 22 01/16/2024   BUN 20 01/16/2024   CREATININE 1.00 01/16/2024   GFRNONAA >60 01/16/2024   CALCIUM 9.3 01/16/2024   PHOS 2.2 (L) 03/14/2022   PROT 6.5 01/16/2024   ALBUMIN 3.6 01/16/2024   BILITOT 0.8 01/16/2024   ALKPHOS 56 01/16/2024   AST 28 01/16/2024   ALT 17 01/16/2024   ANIONGAP 13 01/16/2024   Last thyroid functions Lab Results  Component Value Date   TSH 2.64 11/10/2023      The ASCVD Risk score (Arnett DK, et al., 2019) failed to calculate for the following reasons:   The 2019 ASCVD risk score is only valid for ages 36 to 51    Assessment & Plan:   #1 RSV infection diagnosed recently.  Clinically improving.  Lung exam is unremarkable today.  O2 sat 97% room air.  No wheezing.  No retractions.  Follow-up immediately for any fever or change of symptoms.  Gradually increase activities as tolerated  #2 cardiomegaly on recent chest x-ray of uncertain significance.  He does relate some baseline dyspnea with activity but no orthopnea or peripheral edema or weight gain.  No known history of heart failure.  Obtain echocardiogram to further assess.   No follow-ups on file.    Evelena Peat, MD

## 2024-01-26 NOTE — Patient Instructions (Signed)
I have ordered echo to evaluate the cardiomegaly (enlarged heart).

## 2024-02-10 ENCOUNTER — Other Ambulatory Visit: Payer: Self-pay | Admitting: Psychiatry

## 2024-02-10 DIAGNOSIS — F3342 Major depressive disorder, recurrent, in full remission: Secondary | ICD-10-CM

## 2024-02-10 DIAGNOSIS — F422 Mixed obsessional thoughts and acts: Secondary | ICD-10-CM

## 2024-02-10 DIAGNOSIS — F411 Generalized anxiety disorder: Secondary | ICD-10-CM

## 2024-02-26 ENCOUNTER — Other Ambulatory Visit (HOSPITAL_BASED_OUTPATIENT_CLINIC_OR_DEPARTMENT_OTHER): Payer: Medicare Other

## 2024-03-19 ENCOUNTER — Ambulatory Visit (HOSPITAL_BASED_OUTPATIENT_CLINIC_OR_DEPARTMENT_OTHER)

## 2024-03-19 DIAGNOSIS — R0600 Dyspnea, unspecified: Secondary | ICD-10-CM | POA: Diagnosis not present

## 2024-03-19 DIAGNOSIS — I517 Cardiomegaly: Secondary | ICD-10-CM | POA: Diagnosis not present

## 2024-03-19 LAB — ECHOCARDIOGRAM COMPLETE
Area-P 1/2: 3.08 cm2
S' Lateral: 3.05 cm

## 2024-03-28 ENCOUNTER — Other Ambulatory Visit: Payer: Self-pay | Admitting: Psychiatry

## 2024-03-28 DIAGNOSIS — F3342 Major depressive disorder, recurrent, in full remission: Secondary | ICD-10-CM

## 2024-04-01 ENCOUNTER — Other Ambulatory Visit: Payer: Self-pay | Admitting: Psychiatry

## 2024-04-01 DIAGNOSIS — G3184 Mild cognitive impairment, so stated: Secondary | ICD-10-CM

## 2024-05-15 ENCOUNTER — Other Ambulatory Visit: Payer: Self-pay | Admitting: Psychiatry

## 2024-05-15 ENCOUNTER — Other Ambulatory Visit: Payer: Self-pay | Admitting: Family Medicine

## 2024-05-15 DIAGNOSIS — F422 Mixed obsessional thoughts and acts: Secondary | ICD-10-CM

## 2024-05-15 DIAGNOSIS — F411 Generalized anxiety disorder: Secondary | ICD-10-CM

## 2024-05-15 DIAGNOSIS — F3342 Major depressive disorder, recurrent, in full remission: Secondary | ICD-10-CM

## 2024-06-11 ENCOUNTER — Ambulatory Visit: Payer: Medicare Other | Admitting: Psychiatry

## 2024-06-11 ENCOUNTER — Encounter: Payer: Self-pay | Admitting: Psychiatry

## 2024-06-11 DIAGNOSIS — G301 Alzheimer's disease with late onset: Secondary | ICD-10-CM

## 2024-06-11 DIAGNOSIS — F422 Mixed obsessional thoughts and acts: Secondary | ICD-10-CM

## 2024-06-11 DIAGNOSIS — G3184 Mild cognitive impairment, so stated: Secondary | ICD-10-CM

## 2024-06-11 DIAGNOSIS — F411 Generalized anxiety disorder: Secondary | ICD-10-CM | POA: Diagnosis not present

## 2024-06-11 DIAGNOSIS — R7989 Other specified abnormal findings of blood chemistry: Secondary | ICD-10-CM

## 2024-06-11 DIAGNOSIS — F3342 Major depressive disorder, recurrent, in full remission: Secondary | ICD-10-CM | POA: Diagnosis not present

## 2024-06-11 DIAGNOSIS — F02A0 Dementia in other diseases classified elsewhere, mild, without behavioral disturbance, psychotic disturbance, mood disturbance, and anxiety: Secondary | ICD-10-CM

## 2024-06-11 MED ORDER — DONEPEZIL HCL 10 MG PO TABS: 10.0000 mg | ORAL_TABLET | Freq: Every day | ORAL | 3 refills | Status: DC

## 2024-06-11 MED ORDER — LITHIUM CARBONATE 150 MG PO CAPS: 450.0000 mg | ORAL_CAPSULE | Freq: Every day | ORAL | 1 refills | Status: DC

## 2024-06-11 MED ORDER — SERTRALINE HCL 100 MG PO TABS: 200.0000 mg | ORAL_TABLET | Freq: Every day | ORAL | 1 refills | Status: DC

## 2024-06-11 NOTE — Progress Notes (Signed)
 [name] [id number] [birth year] 83 y.o.  Subjective:   Patient ID:  Jason [name] is a 83 y.o. (DOB [birth year]) male.  Chief Complaint:  Chief Complaint  Patient presents with   Follow-up   Depression   Anxiety    Jason [name] presents to the office today for follow-up of MDE, OCD and GAD.  seen August, 2020.  No meds were changed.  5/5//21  Seen with wife Jason [name]. Had mild Covid in fall 2020 and got the vaccine.   CO forgetfulness with names and places but not getting words out correctly.   Wife notices the same but she's not overly concerned but she has noticed occ gets confused about people. PCP checked b12 normal bc pt brought up memory concern.   11/16/20 appt with following noted:  Seen with wife Jason [name] Still doing well. Occ spellls of anxiety randomly.  Does better if doesn't forget meds. No SE.  Memory trouble limited mainly to names.  Not much worse.  OK otherwise. Depression can occur but only last a day.  Depression and anxiety under control.  Except notices Feb each year a change in disposition but it's minor.  Patient reports stable mood and denies depressed or irritable moods.   Patient denies difficulty with sleep initiation or maintenance but it is split with some napping.  EMA. Denies appetite disturbance.  Patient reports that energy and motivation have been good.  Patient denies any difficulty with concentration.  Patient denies any suicidal ideation. Stress caring for inlaws in their 90s.  F good cognition.  M cognitive problems. Plan: Patient's lithium  level was 0.51-year ago on 600 mg daily.  Level on 11/28/2020 was 0.9 on 600 mg daily.  The lithium  level has crept up a little bit.  I am concerned it may start causing side effects.  Therefore a prescription has been sent so that he can reduce the dose to 450 mg daily by using 3 of the 150 mg capsules.  06/10/2021 appointment with the following noted: seen with wife Doing fine and no problems with reduction in lithium   to 450 mg daily. Frustration and down with world and politics and can interfere with sleep lately better. Staying up later and more irregular pattern.  Will nap at times. Questions about meds Plan no changes  12/09/21 appt noted: seen with wife Sertraline  200 mg daily and lithium  450 mg and donepezil  10 mg HS, Cerefolin NAC Consistent with meds.  No SE Doing well for 83 yo. Patient reports stable mood and denies depressed or irritable moods.  Patient denies any recent difficulty with anxiety.  Patient denies difficulty with sleep initiation or maintenance. Denies appetite disturbance.  Patient reports that energy and motivation have been good.  Patient denies any difficulty with concentration.  Patient denies any suicidal ideation. Helping homeless families. Plan: High relapse risk if reduce meds.  But rarely high dose sertraline  can interfere with cognition but he's at risk with reduction. No med changes. Continue lithium  450 mg daily Sertraline  200 Aricept  10  06/06/22 appt noted: seen with wife per usual Doing fine except fell off porch and hip fx with replacement since here. Recovery has gone well.  After surgery in March.  Finished PT at home and now some outpt PT. Dr. Amadeo June.  Pleased with him. Not depressed.  Patient reports stable mood and denies depressed or irritable moods.  Patient denies any recent difficulty with anxiety.  Patient denies difficulty with sleep initiation or maintenance. Denies appetite disturbance.  Patient reports that energy and motivation have been good.  Patient has mild forgetfulness. Patient denies any suicidal ideation. No SE problems.  12/06/22 appt noted:  seen with wife Good I most respects. Still some memory issues and tries to work on it and pay attention better. Not losing stuff.   Saw Dr. Mora Apple dx MCI  MMSE 23/30.  Plans neuropsych testing in Feb [facility], Shella Devoid.  Also sched MRI 09/21/22 mild mod  atrophy Satisfied with meds.   Not dep. Momentary anxiety but not persistent.  No panic. Sleep is best it's been at the moment.  More effort to improve it.  Was going to sleep too late.   No problems with meds. Plan: No med changes. Continue lithium  450 mg daily Sertraline  200.  Option reduce Aricept  10 Continue Vitamin D  with level in 50s Check lithium  level  6/12/224 appt noted:  seen with wife.   I think I'm doing good.  Except forgetful esp names.  Fights off the forgetfulness.   He thinks it is better than it was.   Not depressed.  Anxiety ok.   Continues meds including Cerefolin NAC. No SE with meds.  Except poss tremor.   No concerns with meds.   DX mild OSA previously without benefit from CPAP .  Lost wt since then. Wife notices if several things going on then pt has trouble keeping up with them.  Plan: start Namenda  trial Continue lithium  450 mg daily Sertraline  200.  Option reduce Aricept  10 Continue Vitamin D  with level in 50s  12/07/23 appt noted:  seen with wife. Meds as above including memantine  10 BID, donepezil  10, sertraline  200, lithium  450 HS. Doing pretty well.  No sig dep nor anxiety.  Can get anxious under stress.  Enjoy having something to do.   Still memory problems.  Names and restaurants and new things.  Can forget names of meds.   Dr. Darren Em checked TSH normal. Sleep is ok.   Dr. Cassandra Cleveland [facility] 09/06/23 MMSE =19,09/06/22 MMSE 23 Plan: High relapse risk if reduce meds.  But rarely high dose sertraline  can interfere with cognition but he's at risk with reduction. Continue Namenda  10 BID Continue lithium  450 mg daily Sertraline  200.  Option reduce Aricept  10 Continue Vitamin D  with level in 50s  06/11/24 appt noted : with W Meds as above including memantine  10 BID, donepezil  10, sertraline  200, lithium  450 HS, vit D. Not dep.  No sig anxiety other than brief.   No SE.   Saw PCP lately with no med changes  Past Psychiatric Medication Trials:  Wellbutrin side effects, Lexapro nausea,  paroxetine, fluoxetine briefly, duloxetine, Effexor  with a history of benefit, mirtazapine, Pristiq 100, sertraline  200,  Abilify,  buspirone side effects, lithium  600, olanzapine 2.5 with benefit. Aricept  , memantine  10 BID ECT  Review of Systems:  Review of Systems  HENT:  Positive for hearing loss.   Respiratory:  Negative for chest tightness.   Cardiovascular:  Negative for chest pain and palpitations.  Genitourinary:  Positive for frequency.  Musculoskeletal:  Positive for arthralgias and back pain.  Neurological:  Negative for tremors.  Psychiatric/Behavioral:  Negative for agitation, behavioral problems, confusion, decreased concentration, dysphoric mood, hallucinations, self-injury, sleep disturbance and suicidal ideas. The patient is nervous/anxious. The patient is not hyperactive.     Medications: I have reviewed the patient's current medications.  Current Outpatient Medications  Medication Sig Dispense Refill   amLODipine  (NORVASC ) 5 MG tablet Take 1 tablet (  5 mg total) by mouth daily. 90 tablet 3   BIOTIN PO Take 1 tablet by mouth daily.     [employer] DICLOFENAC SODIUM 1 % GEL Apply 2 g topically 4 (four) times daily as needed (Pain).     ferrous sulfate 325 (65 FE) MG tablet Take 325 mg by mouth daily with breakfast.     memantine  (NAMENDA ) 10 MG tablet TAKE 1 TABLET BY MOUTH TWICE A DAY 180 tablet 1   Methylfol-Algae-B12-Acetylcyst (CEREFOLIN NAC) 6-90.314-2-600 MG TABS TAKE 1 TABLET BY MOUTH EVERY DAY 90 tablet 3   Multiple Vitamins-Minerals (MULTIVITAMIN ADULTS) TABS Take 1 tablet by mouth daily.     simvastatin  (ZOCOR ) 20 MG tablet Take 1 tablet (20 mg total) by mouth daily. 90 tablet 3   Vitamin D , Ergocalciferol , (DRISDOL ) 1.25 MG (50000 UNIT) CAPS capsule TAKE 1 CAPSULE BY MOUTH ONE TIME PER WEEK 12 capsule 0   donepezil  (ARICEPT ) 10 MG tablet Take 1 tablet (10 mg total) by mouth at bedtime. 90 tablet 3   lithium  carbonate 150 MG  capsule Take 3 capsules (450 mg total) by mouth daily. 270 capsule 1   sertraline  (ZOLOFT ) 100 MG tablet Take 2 tablets (200 mg total) by mouth daily. 180 tablet 1   No current facility-administered medications for this visit.    Medication Side Effects: None except occ jerks from lithium   Allergies: No Known Allergies  Past Medical History:  Diagnosis Date   Anxiety    Atrial fibrillation (HCC)    past hx   Colon polyp    Depression    GERD (gastroesophageal reflux disease)    Gout, unspecified    Hypertension    Osteoarthrosis, unspecified whether generalized or localized, unspecified site    Pneumonia, organism unspecified(486)    Pure hypercholesterolemia    Routine general medical examination at a health care facility    Sleep apnea    not using cpap currently   Unspecified nonpsychotic mental disorder     Family History  Problem Relation Age of Onset   Dementia Mother    Pneumonia Father    Heart failure Father    Arthritis Other    Colon cancer Neg Hx    Colon polyps Neg Hx    Esophageal cancer Neg Hx    Stomach cancer Neg Hx    Rectal cancer Neg Hx     Social History   Socioeconomic History   Marital status: Married    Spouse name: Jason [name]   Number of children: 0   Years of education: Not on file   Highest education level: Not on file  Occupational History   Occupation: retired    Associate Professor: [employer]  Tobacco Use   Smoking status: Never   Smokeless tobacco: Never  Vaping Use   Vaping status: Never Used  Substance and Sexual Activity   Alcohol  use: No   Drug use: No   Sexual activity: Not on file  Other Topics Concern   Not on file  Social History Narrative   Right handed   Lives with wife   Caffeine-1-2 daily   Social Drivers of Health   Financial Resource Strain: Low Risk  (01/23/2024)   Overall Financial Resource Strain (CARDIA)    Difficulty of Paying Living Expenses: Not very hard  Food Insecurity: No Food Insecurity (01/23/2024)    Hunger Vital Sign    Worried About Running Out of Food in the Last Year: Never true    Ran Out of Food in the  Last Year: Never true  Transportation Needs: No Transportation Needs (01/23/2024)   PRAPARE - Administrator, Civil Service (Medical): No    Lack of Transportation (Non-Medical): No  Physical Activity: Not on file  Stress: Not on file  Social Connections: Not on file  Intimate Partner Violence: Not on file    Past Medical History, Surgical history, Social history, and Family history were reviewed and updated as appropriate.   Please see review of systems for further details on the patient's review from today.   Objective:   Physical Exam:  There were no vitals taken for this visit.  Physical Exam Constitutional:      General: He is not in acute distress.    Appearance: He is well-developed.   Musculoskeletal:        General: No deformity.   Neurological:     Mental Status: He is alert and oriented to person, place, and time.     Motor: No tremor.     Coordination: Coordination normal.   Psychiatric:        Attention and Perception: Attention normal. He is attentive. He does not perceive auditory hallucinations.        Mood and Affect: Mood normal. Mood is not anxious or depressed. Affect is not labile, blunt, angry, tearful or inappropriate.        Speech: Speech normal. Speech is not slurred.        Behavior: Behavior normal. Behavior is not slowed.        Thought Content: Thought content normal. Thought content is not delusional. Thought content does not include homicidal or suicidal ideation. Thought content does not include suicidal plan.        Cognition and Memory: Cognition normal. He exhibits impaired recent memory.        Judgment: Judgment normal.     Comments: Insight is fair to good. Positive affect Memory no worse     Lab Review:     Component Value Date/Time   NA 136 01/16/2024 0705   K 4.3 01/16/2024 0705   CL 103 01/16/2024 0705    CO2 22 01/16/2024 0609   GLUCOSE 145 (H) 01/16/2024 0705   BUN 20 01/16/2024 0705   CREATININE 1.00 01/16/2024 0705   CALCIUM 9.3 01/16/2024 0609   PROT 6.5 01/16/2024 0609   ALBUMIN 3.6 01/16/2024 0609   AST 28 01/16/2024 0609   ALT 17 01/16/2024 0609   ALKPHOS 56 01/16/2024 0609   BILITOT 0.8 01/16/2024 0609   GFRNONAA >60 01/16/2024 0609   GFRAA >60 04/19/2020 1247       Component Value Date/Time   WBC 8.7 01/16/2024 0609   RBC 3.83 (L) 01/16/2024 0609   HGB 12.6 (L) 01/16/2024 0705   HCT 37.0 (L) 01/16/2024 0705   PLT 174 01/16/2024 0609   MCV 97.9 01/16/2024 0609   MCH 32.4 01/16/2024 0609   MCHC 33.1 01/16/2024 0609   RDW 12.9 01/16/2024 0609   LYMPHSABS 0.5 (L) 01/16/2024 0609   MONOABS 0.7 01/16/2024 0609   EOSABS 0.0 01/16/2024 0609   BASOSABS 0.0 01/16/2024 0609   Normal TSH in September.  Last lithium  0.8 in July 2019  Lithium  level January 10, 2027 0.5  Lithium  Lvl  Date Value Ref Range Status  01/31/2023 0.6 0.6 - 1.2 mmol/L Final   01/04/22 lithium  level 0.6 on 450 mg daily.  No results found for: PHENYTOIN, PHENOBARB, VALPROATE, CBMZ   .res Assessment: Plan:    Recurrent major depression in  complete remission (HCC) - Plan: Lithium  level, sertraline  (ZOLOFT ) 100 MG tablet, lithium  carbonate 150 MG capsule  Mixed obsessional thoughts and acts - Plan: sertraline  (ZOLOFT ) 100 MG tablet  Generalized anxiety disorder - Plan: sertraline  (ZOLOFT ) 100 MG tablet  Mild late onset Alzheimer's dementia without behavioral disturbance, psychotic disturbance, mood disturbance, or anxiety (HCC)  Low vitamin D  level  Mild cognitive impairment - Plan: donepezil  (ARICEPT ) 10 MG tablet   Mr. Padget has a long history of the above diagnoses which have been treatment resistant at times and multiple relapses.  He ultimately responded to the combination of sertraline  and lithium  and Zyprexa.  He has been able to wean off the Zyprexa without complication.  He's  continuing to do well.  As noted multiple failed meds and a history of treatment resistance. Each diagnosis under control and meds helping.   Extensive disucussion of sleep hygiene.  Also has nocturia.  Agree with pursuing hearing aids given cognitive concerns.  Then consider neuro work up.  Supportive therapy dealing with sick in-laws and caretaking.  Encourage self-care and recreation.  Also dealing with homeless people.  Counseled patient regarding potential benefits, risks, and side effects of lithium  to include potential risk of lithium  affecting thyroid  and renal function.  Discussed need for periodic lab monitoring to determine drug level and to assess for potential adverse effects.  Counseled patient regarding signs and symptoms of lithium  toxicity and advised that they notify office immediately or seek urgent medical attention if experiencing these signs and symptoms.  Patient advised to contact office with any questions or concerns.  Call if any changes in BP meds.  Disc DDI  Lithium  level is in the low normal range and is typical for the levels he has had over the last couple of years.  Labs good.  Disc risk s and TSH also normal. Has normal D on 50K weekly. and B12 levels Reduced lithium  from 600 to 450 Dec 2021 bc Cr creep without problems. 03/14/22 Cr. 0.8. Continue lithium  to AM to see if nocturia is helped.  01/04/22 lithium  level 0.6 on 450 mg daily. 01/31/23 Lithium  0.6  on 450 mg daily  [facility]  Check level every 6 mos.  They haven't done it.    Disc the difference between Cerefolin NAC and Metafolbic plus angain in detail.Aaron Aas option switch to NAC & B complex  continue donepezil  10 mg nightly.   Neuro workup normal and neuropsych testing with the following results per Dr. Cassandra Cleveland, [facility]. Results of neuropsychological testing were deemed valid and interpretable.  They reveal significant cognitive impairment across multiple domains, most notably  affecting frontal executive function and processing speed.  Memory was also substantially impacted, characterized by his shallow encoding, poor spontaneous retrieval, and limited benefit from cues.  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  These findings suggest probable frontal subcortical dysfunction, potentially compounded by early stage of Alzheimer disease.  Previously.  Disc these results with pt and his wife at their request.  Disc need to get hearing aids and how they might help cognition or slow cognitive decline.  Option Namenda   High relapse risk if reduce meds.  But rarely high dose sertraline  can interfere with cognition but he's at risk with reduction. No med change indicated:  Continue Namenda  10 BID Continue lithium  450 mg daily.  DT nocturia switch to AM to see if it is better Sertraline  200.  Option reduce Aricept  10 Continue  Vitamin D  with level in 50s  Get lithium  level.  This appt was 30 mins.  FU 6 mos  Nori Beat, MD, DFAPA     Please see After Visit Summary for patient specific instructions.  No future appointments.    Orders Placed This Encounter  Procedures   Lithium  level      -------------------------------

## 2024-06-13 DIAGNOSIS — F3342 Major depressive disorder, recurrent, in full remission: Secondary | ICD-10-CM | POA: Diagnosis not present

## 2024-06-14 LAB — LITHIUM LEVEL: Lithium Lvl: 0.5 mmol/L — ABNORMAL LOW (ref 0.6–1.2)

## 2024-07-12 ENCOUNTER — Encounter: Payer: Self-pay | Admitting: Advanced Practice Midwife

## 2024-08-03 ENCOUNTER — Other Ambulatory Visit: Payer: Self-pay | Admitting: Family Medicine

## 2024-09-05 ENCOUNTER — Telehealth: Payer: Self-pay | Admitting: Psychiatry

## 2024-09-05 NOTE — Telephone Encounter (Signed)
 Next appt is 12/11/24. Jason [name] called stating that [facility] denied the Cerefolin [facility]) stating that Jason [name] does not need this. Jason [name] phone number is   [phone]

## 2024-09-06 MED ORDER — CEREFOLIN NAC 6-90.314-2-600 MG PO TABS: 1.0000 | ORAL_TABLET | Freq: Every day | ORAL | 3 refills | Status: AC

## 2024-09-06 NOTE — Telephone Encounter (Signed)
 RF had expired. Sent new Rx.

## 2024-09-23 ENCOUNTER — Ambulatory Visit

## 2024-09-23 VITALS — BP 124/64 | HR 66 | Temp 98.8°F | Ht 73.62 in | Wt 204.0 lb

## 2024-09-23 DIAGNOSIS — Z0000 Encounter for general adult medical examination without abnormal findings: Secondary | ICD-10-CM

## 2024-09-23 NOTE — Patient Instructions (Addendum)
 Jason [name],  Thank you for taking the time for your Medicare Wellness Visit. I appreciate your continued commitment to your health goals. Please review the care plan we discussed, and feel free to reach out if I can assist you further.  Medicare recommends these wellness visits once per year to help you and your care team stay ahead of potential health issues. These visits are designed to focus on prevention, allowing your provider to concentrate on managing your acute and chronic conditions during your regular appointments.  Please note that Annual Wellness Visits do not include a physical exam. Some assessments may be limited, especially if the visit was conducted virtually. If needed, we may recommend a separate in-person follow-up with your provider.  Ongoing Care Seeing your primary care provider every 3 to 6 months helps us  monitor your health and provide consistent, personalized care.   Referrals If a referral was made during today's visit and you haven't received any updates within two weeks, please contact the referred provider directly to check on the status.  Recommended Screenings:  Health Maintenance  Topic Date Due   Zoster (Shingles) Vaccine (2 of 2) 10/23/2023   Colon Cancer Screening  07/09/2024   Flu Shot  07/26/2024   COVID-19 Vaccine (5 - Pfizer risk 2024-25 season) 08/26/2024   DTaP/Tdap/Td vaccine (2 - Td or Tdap) 01/19/2025   Medicare Annual Wellness Visit  09/23/2025   Pneumococcal Vaccine for age over 81  Completed   HPV Vaccine  Aged Out   Meningitis B Vaccine  Aged Out   Hepatitis C Screening  Discontinued       09/23/2024    3:09 PM  Advanced Directives  Does Patient Have a Medical Advance Directive? No  Would patient like information on creating a medical advance directive? No - Patient declined   Advance Care Planning is important because it: Ensures you receive medical care that aligns with your values, goals, and preferences. Provides guidance to  your family and loved ones, reducing the emotional burden of decision-making during critical moments.  Vision: Annual vision screenings are recommended for early detection of glaucoma, cataracts, and diabetic retinopathy. These exams can also reveal signs of chronic conditions such as diabetes and high blood pressure.  Dental: Annual dental screenings help detect early signs of oral cancer, gum disease, and other conditions linked to overall health, including heart disease and diabetes.  Please see the attached documents for additional preventive care recommendations.

## 2024-09-23 NOTE — Progress Notes (Signed)
 Subjective:   Jason [name] is a 83 y.o. who presents for a Medicare Wellness preventive visit.  As a reminder, Annual Wellness Visits don't include a physical exam, and some assessments may be limited, especially if this visit is performed virtually. We may recommend an in-person follow-up visit with your provider if needed.  Visit Complete: In person    Persons Participating in Visit: Patient assisted by Wife.  AWV Questionnaire: No: Patient Medicare AWV questionnaire was not completed prior to this visit.  Cardiac Risk Factors include: advanced age (>61men, >54 women);male gender;hypertension     Objective:    Today's Vitals   09/23/24 1407  BP: 124/64  Pulse: 66  Temp: 98.8 F (37.1 C)  TempSrc: Oral  SpO2: 97%  Weight: 204 lb (92.5 kg)  Height: 6' 1.62 (1.87 m)   Body mass index is 26.46 kg/m.     09/23/2024    3:09 PM 02/01/2023   11:21 PM 07/04/2022   11:00 AM 03/12/2022    4:33 AM 04/19/2020   11:43 AM 04/24/2019    4:32 PM 09/19/2018    1:33 PM  Advanced Directives  Does Patient Have a Medical Advance Directive? No No No No No No No   Would patient like information on creating a medical advance directive? No - Patient declined No - Patient declined No - Patient declined No - Patient declined        Data saved with a previous flowsheet row definition    Current Medications (verified) Outpatient Encounter Medications as of 09/23/2024  Medication Sig   amLODipine  (NORVASC ) 5 MG tablet Take 1 tablet (5 mg total) by mouth daily.   BIOTIN PO Take 1 tablet by mouth daily.   [employer] DICLOFENAC SODIUM 1 % GEL Apply 2 g topically 4 (four) times daily as needed (Pain).   donepezil  (ARICEPT ) 10 MG tablet Take 1 tablet (10 mg total) by mouth at bedtime.   ferrous sulfate 325 (65 FE) MG tablet Take 325 mg by mouth daily with breakfast.   lithium  carbonate 150 MG capsule Take 3 capsules (450 mg total) by mouth daily.   memantine  (NAMENDA ) 10 MG tablet TAKE 1 TABLET BY MOUTH  TWICE A DAY   Methylfol-Algae-B12-Acetylcyst (CEREFOLIN NAC) 6-90.314-2-600 MG TABS Take 1 tablet by mouth daily.   Multiple Vitamins-Minerals (MULTIVITAMIN ADULTS) TABS Take 1 tablet by mouth daily.   sertraline  (ZOLOFT ) 100 MG tablet Take 2 tablets (200 mg total) by mouth daily.   simvastatin  (ZOCOR ) 20 MG tablet Take 1 tablet (20 mg total) by mouth daily.   Vitamin D , Ergocalciferol , (DRISDOL ) 1.25 MG (50000 UNIT) CAPS capsule TAKE 1 CAPSULE BY MOUTH ONE TIME PER WEEK   No facility-administered encounter medications on file as of 09/23/2024.    Allergies (verified) Patient has no known allergies.   History: Past Medical History:  Diagnosis Date   Anxiety    Atrial fibrillation (HCC)    past hx   Colon polyp    Depression    GERD (gastroesophageal reflux disease)    Gout, unspecified    Hypertension    Osteoarthrosis, unspecified whether generalized or localized, unspecified site    Pneumonia, organism unspecified(486)    Pure hypercholesterolemia    Routine general medical examination at a health care facility    Sleep apnea    not using cpap currently   Unspecified nonpsychotic mental disorder    Past Surgical History:  Procedure Laterality Date   COLONOSCOPY  10/04/2006   jacobs  TOTAL HIP ARTHROPLASTY Right 03/12/2022   Procedure: TOTAL HIP ARTHROPLASTY ANTERIOR APPROACH;  Surgeon: Fidel Rogue, MD;  Location: [facility];  Service: Orthopedics;  Laterality: Right;   UPPER GASTROINTESTINAL ENDOSCOPY  2015   WISDOM TOOTH EXTRACTION     Family History  Problem Relation Age of Onset   Dementia Mother    Pneumonia Father    Heart failure Father    Arthritis Other    Colon cancer Neg Hx    Colon polyps Neg Hx    Esophageal cancer Neg Hx    Stomach cancer Neg Hx    Rectal cancer Neg Hx    Social History   Socioeconomic History   Marital status: Married    Spouse name: [name]   Number of children: 0   Years of education: Not on file   Highest education level: Not  on file  Occupational History   Occupation: retired    Associate Professor: [employer]  Tobacco Use   Smoking status: Never   Smokeless tobacco: Never  Vaping Use   Vaping status: Never Used  Substance and Sexual Activity   Alcohol  use: No   Drug use: No   Sexual activity: Not on file  Other Topics Concern   Not on file  Social History Narrative   Right handed   Lives with wife   Caffeine-1-2 daily   Social Drivers of Health   Financial Resource Strain: Low Risk  (09/23/2024)   Overall Financial Resource Strain (CARDIA)    Difficulty of Paying Living Expenses: Not hard at all  Food Insecurity: No Food Insecurity (09/23/2024)   Hunger Vital Sign    Worried About Running Out of Food in the Last Year: Never true    Ran Out of Food in the Last Year: Never true  Transportation Needs: No Transportation Needs (09/23/2024)   PRAPARE - Administrator, Civil Service (Medical): No    Lack of Transportation (Non-Medical): No  Physical Activity: Inactive (09/23/2024)   Exercise Vital Sign    Days of Exercise per Week: 0 days    Minutes of Exercise per Session: 0 min  Stress: No Stress Concern Present (09/23/2024)   [employer] of Occupational Health - Occupational Stress Questionnaire    Feeling of Stress: Not at all  Social Connections: Socially Integrated (09/23/2024)   Social Connection and Isolation Panel    Frequency of Communication with Friends and Family: More than three times a week    Frequency of Social Gatherings with Friends and Family: More than three times a week    Attends Religious Services: More than 4 times per year    Active Member of [employer] or Organizations: Yes    Attends Engineer, structural: More than 4 times per year    Marital Status: Married    Tobacco Counseling Counseling given: Not Answered    Clinical Intake:  Pre-visit preparation completed: Yes  Pain : No/denies pain     BMI - recorded: 26.46 Nutritional Status: BMI 25 -29  Overweight Nutritional Risks: None Diabetes: No  Lab Results  Component Value Date   HGBA1C 5.3 03/12/2022     How often do you need to have someone help you when you read instructions, pamphlets, or other written materials from your doctor or pharmacy?: 1 - Never  Interpreter Needed?: No  Information entered by :: Rojelio Files LPN   Activities of Daily Living     09/23/2024    3:05 PM  In your present state  of health, do you have any difficulty performing the following activities:  Hearing? 0  Vision? 0  Difficulty concentrating or making decisions? 0  Walking or climbing stairs? 0  Dressing or bathing? 0  Doing errands, shopping? 0  Preparing Food and eating ? N  Using the Toilet? N  In the past six months, have you accidently leaked urine? N  Do you have problems with loss of bowel control? N  Managing your Medications? N  Managing your Finances? N  Housekeeping or managing your Housekeeping? N    Patient Care Team: Micheal Wolm ORN, MD as PCP - General (Family Medicine) Cottle, Lorene KANDICE Raddle., MD as Attending Physician (Psychiatry)  I have updated your Care Teams any recent Medical Services you may have received from other providers in the past year.     Assessment:   This is a routine wellness examination for Chaka.  Hearing/Vision screen Hearing Screening - Comments:: Denies hearing difficulties   Vision Screening - Comments:: Wears rx glasses - up to date with routine eye exams with  Dr Waylan   Goals Addressed               This Visit's Progress     Increase physical activity (pt-stated)        Get more active.       Depression Screen     09/23/2024    3:04 PM 11/10/2023   10:36 AM 10/21/2022   10:02 AM 04/19/2022    2:34 PM 01/26/2021   10:02 AM 04/22/2020   11:58 AM 09/19/2018    1:35 PM  PHQ 2/9 Scores  PHQ - 2 Score 0 0 2 1 1  0 0  PHQ- 9 Score  0 5 4 2       Fall Risk     09/23/2024    3:07 PM 11/10/2023   10:36 AM 10/21/2022    10:06 AM 04/19/2022    2:35 PM 01/26/2021    9:23 AM  Fall Risk   Falls in the past year? 1 0 1 1 0  Number falls in past yr: 0 0 0 0   Injury with Fall? 0 0 1 1   Risk for fall due to : No Fall Risks No Fall Risks No Fall Risks No Fall Risks   Follow up Falls evaluation completed Falls evaluation completed Falls evaluation completed  Falls evaluation completed       Data saved with a previous flowsheet row definition    MEDICARE RISK AT HOME:  Medicare Risk at Home Any stairs in or around the home?: No If so, are there any without handrails?: No Home free of loose throw rugs in walkways, pet beds, electrical cords, etc?: Yes Adequate lighting in your home to reduce risk of falls?: Yes Life alert?: No Use of a cane, walker or w/c?: No Grab bars in the bathroom?: Yes Shower chair or bench in shower?: Yes Elevated toilet seat or a handicapped toilet?: Yes  TIMED UP AND GO:  Was the test performed?  Yes  Length of time to ambulate 10 feet: 10 sec Gait steady and fast without use of assistive device  Cognitive Function: 6CIT completed    09/19/2018    1:50 PM 09/12/2017    1:34 PM  MMSE - Mini Mental State Exam  Orientation to time 3 5   Orientation to Place 5 5   Registration 3 3   Attention/ Calculation 5 5   Recall 2 3   Language- name  2 objects 2 2   Language- repeat 1 1  Language- follow 3 step command 3 3   Language- read & follow direction 1 1   Write a sentence 1 1   Copy design 1 1   Total score 27 30      Data saved with a previous flowsheet row definition      09/06/2023    2:35 PM 09/06/2022    1:21 PM  Montreal Cognitive Assessment   Visuospatial/ Executive (0/5) 3 3  Naming (0/3) 3 3  Attention: Read list of digits (0/2) 2 2  Attention: Read list of letters (0/1) 1 1  Attention: Serial 7 subtraction starting at 100 (0/3) 0 3  Language: Repeat phrase (0/2) 2 1  Language : Fluency (0/1) 1 0  Abstraction (0/2) 2 2  Delayed Recall (0/5) 0 2   Orientation (0/6) 5 6  Total 19 23      09/23/2024    3:08 PM  6CIT Screen  What Year? 0 points  What month? 0 points  What time? 0 points  Count back from 20 0 points  Months in reverse 2 points  Repeat phrase 6 points  Total Score 8 points    Immunizations Immunization History  Administered Date(s) Administered   Fluad Quad(high Dose 65+) 10/16/2020, 11/03/2022   H1N1 12/24/2008   INFLUENZA, HIGH DOSE SEASONAL PF 11/27/2015, 11/29/2016, 09/05/2017, 09/03/2018, 11/04/2019, 11/04/2019, 10/17/2023   Influenza Split 11/14/2011, 10/05/2012   Influenza Whole 10/15/2008, 11/20/2009, 12/10/2010   Influenza,inj,Quad PF,6+ Mos 09/26/2013, 10/23/2014   Influenza-Unspecified 10/10/2023   Moderna Covid-19 Fall Seasonal Vaccine 76yrs & older 10/17/2023   PFIZER(Purple Top)SARS-COV-2 Vaccination 03/02/2020, 04/01/2020, 11/03/2020   Pneumococcal Conjugate-13 07/17/2014   Pneumococcal Polysaccharide-23 07/20/2015   Tdap 01/19/2015   Unspecified SARS-COV-2 Vaccination 10/10/2023   Zoster Recombinant(Shingrix) 08/28/2023    Screening Tests Health Maintenance  Topic Date Due   Zoster Vaccines- Shingrix (2 of 2) 10/23/2023   Colonoscopy  07/09/2024   Influenza Vaccine  07/26/2024   COVID-19 Vaccine (5 - Pfizer risk 2024-25 season) 08/26/2024   DTaP/Tdap/Td (2 - Td or Tdap) 01/19/2025   Medicare Annual Wellness (AWV)  09/23/2025   Pneumococcal Vaccine: 50+ Years  Completed   HPV VACCINES  Aged Out   Meningococcal B Vaccine  Aged Out   Hepatitis C Screening  Discontinued    Health Maintenance Items Addressed:   Additional Screening:  Vision Screening: Recommended annual ophthalmology exams for early detection of glaucoma and other disorders of the eye. Is the patient up to date with their annual eye exam?  Yes  Who is the provider or what is the name of the office in which the patient attends annual eye exams? Dr Waylan  Dental Screening: Recommended annual dental exams for  proper oral hygiene  Community Resource Referral / Chronic Care Management: CRR required this visit?  No   CCM required this visit?  No   Plan:    I have personally reviewed and noted the following in the patient's chart:   Medical and social history Use of alcohol , tobacco or illicit drugs  Current medications and supplements including opioid prescriptions. Patient is not currently taking opioid prescriptions. Functional ability and status Nutritional status Physical activity Advanced directives List of other physicians Hospitalizations, surgeries, and ER visits in previous 12 months Vitals Screenings to include cognitive, depression, and falls Referrals and appointments  In addition, I have reviewed and discussed with patient certain preventive protocols, quality metrics, and best practice recommendations. A  written personalized care plan for preventive services as well as general preventive health recommendations were provided to patient.   Rojelio LELON Blush, LPN   0/[id number]   After Visit Summary: (In Person-Printed) AVS printed and given to the patient  Notes: Nothing significant to report at this time.

## 2024-09-30 ENCOUNTER — Ambulatory Visit: Admitting: Family Medicine

## 2024-09-30 VITALS — BP 110/78 | HR 66 | Temp 98.3°F | Wt 210.0 lb

## 2024-09-30 DIAGNOSIS — N644 Mastodynia: Secondary | ICD-10-CM | POA: Diagnosis not present

## 2024-09-30 DIAGNOSIS — J310 Chronic rhinitis: Secondary | ICD-10-CM | POA: Diagnosis not present

## 2024-09-30 DIAGNOSIS — Z79899 Other long term (current) drug therapy: Secondary | ICD-10-CM | POA: Diagnosis not present

## 2024-09-30 DIAGNOSIS — I10 Essential (primary) hypertension: Secondary | ICD-10-CM

## 2024-09-30 DIAGNOSIS — M7041 Prepatellar bursitis, right knee: Secondary | ICD-10-CM | POA: Diagnosis not present

## 2024-09-30 DIAGNOSIS — E7800 Pure hypercholesterolemia, unspecified: Secondary | ICD-10-CM | POA: Diagnosis not present

## 2024-09-30 LAB — CBC WITH DIFFERENTIAL/PLATELET
Basophils Absolute: 0 K/uL (ref 0.0–0.1)
Basophils Relative: 0.7 % (ref 0.0–3.0)
Eosinophils Absolute: 0.3 K/uL (ref 0.0–0.7)
Eosinophils Relative: 5.2 % — ABNORMAL HIGH (ref 0.0–5.0)
HCT: 37.2 % — ABNORMAL LOW (ref 39.0–52.0)
Hemoglobin: 12.5 g/dL — ABNORMAL LOW (ref 13.0–17.0)
Lymphocytes Relative: 26.4 % (ref 12.0–46.0)
Lymphs Abs: 1.7 K/uL (ref 0.7–4.0)
MCHC: 33.5 g/dL (ref 30.0–36.0)
MCV: 97 fl (ref 78.0–100.0)
Monocytes Absolute: 0.6 K/uL (ref 0.1–1.0)
Monocytes Relative: 8.6 % (ref 3.0–12.0)
Neutro Abs: 3.9 K/uL (ref 1.4–7.7)
Neutrophils Relative %: 59.1 % (ref 43.0–77.0)
Platelets: 202 K/uL (ref 150.0–400.0)
RBC: 3.84 Mil/uL — ABNORMAL LOW (ref 4.22–5.81)
RDW: 13.1 % (ref 11.5–15.5)
WBC: 6.5 K/uL (ref 4.0–10.5)

## 2024-09-30 LAB — TSH: TSH: 2.15 u[IU]/mL (ref 0.35–5.50)

## 2024-09-30 NOTE — Patient Instructions (Signed)
 Consider OTC Flonase for nasal congestion associated with eating.

## 2024-09-30 NOTE — Progress Notes (Signed)
 Established Patient Office Visit  Subjective   Patient ID: Jason [name], male    DOB: 12/14/1941  Age: 83 y.o. MRN: [id number]  Chief Complaint  Patient presents with   Medical Management of Chronic Issues   Cyst    Pt reports his concerns on cyst on R knee.    Nasal Congestion    Pt c/o runny nose every he eats. Lasted for an hour after meal. Noticed it for a year or two    Shoulder Pain    Pt c/o R shoulder pain. Fell backward on R shoulder 3 wks ago. Hard to lift on that shoulder.    Breast Problem    Pt his nipples on breasts are tender. Notice it a month ago.     HPI   Mr. Penna is seen for medical follow-up.  He has several issues to address today as below  He had right hip replacement within the past year.  Getting around well with regard to that but has had persistent prepatellar bursal swelling.  No erythema or warmth.  His surgeon informed him this would likely go away with time.  Does occasionally get on his knees to fix things.  No associated pain.  Recent right nipple soreness.  No inversion.  No nipple bleeding.  No masses palpated.  Symptoms are mostly around the areola region.  No rash.  Third issue is rhinorrhea fairly consistent with eating.  Cannot relate this to any specific foods.  No associated skin rashes or hives.  Has not tried anything medically to see if this would reduce symptoms  He has hyperlipidemia treated with simvastatin  20 mg daily.  Due for follow-up labs soon.  He has hypertension treated with amlodipine  5 mg daily and blood pressures been stable.  He is on lithium  per psychiatry and had lithium  levels done months ago which were stable but is due for follow-up thyroid  level  Past Medical History:  Diagnosis Date   Anxiety    Atrial fibrillation (HCC)    past hx   Colon polyp    Depression    GERD (gastroesophageal reflux disease)    Gout, unspecified    Hypertension    Osteoarthrosis, unspecified whether generalized or localized,  unspecified site    Pneumonia, organism unspecified(486)    Pure hypercholesterolemia    Routine general medical examination at a health care facility    Sleep apnea    not using cpap currently   Unspecified nonpsychotic mental disorder    Past Surgical History:  Procedure Laterality Date   COLONOSCOPY  10/04/2006   jacobs   TOTAL HIP ARTHROPLASTY Right 03/12/2022   Procedure: TOTAL HIP ARTHROPLASTY ANTERIOR APPROACH;  Surgeon: Fidel Rogue, MD;  Location: [facility];  Service: Orthopedics;  Laterality: Right;   UPPER GASTROINTESTINAL ENDOSCOPY  2015   WISDOM TOOTH EXTRACTION      reports that he has never smoked. He has never used smokeless tobacco. He reports that he does not drink alcohol  and does not use drugs. family history includes Arthritis in an other family member; Dementia in his mother; Heart failure in his father; Pneumonia in his father. No Known Allergies  Review of Systems  Constitutional:  Negative for chills, fever and malaise/fatigue.  HENT:  Positive for congestion.   Eyes:  Negative for blurred vision.  Respiratory:  Negative for shortness of breath.   Cardiovascular:  Negative for chest pain.  Gastrointestinal:  Negative for abdominal pain.  Neurological:  Negative for dizziness, weakness and  headaches.      Objective:     BP 110/78 (BP Location: Left Arm, Patient Position: Sitting, Cuff Size: Normal)   Pulse 66   Temp 98.3 F (36.8 C) (Oral)   Wt 210 lb (95.3 kg)   SpO2 95%   BMI 27.24 kg/m  BP Readings from Last 3 Encounters:  09/30/24 110/78  09/23/24 124/64  01/26/24 106/60   Wt Readings from Last 3 Encounters:  09/30/24 210 lb (95.3 kg)  09/23/24 204 lb (92.5 kg)  01/26/24 204 lb 8 oz (92.8 kg)      Physical Exam Vitals reviewed.  Constitutional:      General: He is not in acute distress.    Appearance: He is not ill-appearing.  Cardiovascular:     Rate and Rhythm: Normal rate and regular rhythm.  Pulmonary:     Effort: Pulmonary  effort is normal.     Breath sounds: Normal breath sounds. No wheezing or rales.     Comments: Right breast exam reveals no nipple inversion.  No breast mass palpated.  No skin dimpling. Musculoskeletal:     Comments: Right knee reveals prepatellar bursa edema.  No warmth.  No erythema.  Neurological:     Mental Status: He is alert.      No results found for any visits on 09/30/24.    The ASCVD Risk score (Arnett DK, et al., 2019) failed to calculate for the following reasons:   The 2019 ASCVD risk score is only valid for ages 80 to 33    Assessment & Plan:   #1 right prepatellar bursa inflammation.  No signs of secondary infection.  Relatively small bursa swelling.  We did mention that this could be aspirated but since this is not bothersome reassurance given and recommend no further intervention.  Try to keep pressure off knee is much as possible.  Suspect this will resolve with time  #2 right nipple soreness.  No concerning findings on exam.  Reassurance.  Watch for any nipple inversion, nipple bleeding, or palpable mass  #3 hyperlipidemia treated with simvastatin  20 mg daily.  Check lipid and CMP  #4 rhinorrhea.  Suspect nonallergic rhinitis.  Recommend trial of Flonase over-the-counter daily  #5 high risk medication use with lithium .  Patient had recent EKG within the past year which was stable.  Check thyroid  and CMP  #6 hypertension stable on amlodipine  5 mg daily.  Continue current medication No follow-ups on file.    Wolm Scarlet, MD

## 2024-10-01 ENCOUNTER — Ambulatory Visit: Payer: Self-pay | Admitting: Family Medicine

## 2024-10-01 LAB — LIPID PANEL
Cholesterol: 149 mg/dL (ref 0–200)
HDL: 68.7 mg/dL (ref >39.00)
LDL Cholesterol: 63 mg/dL (ref 0–99)
NonHDL: 79.94
Total CHOL/HDL Ratio: 2
Triglycerides: 86 mg/dL (ref 0.0–149.0)
VLDL: 17.2 mg/dL (ref 0.0–40.0)

## 2024-10-01 LAB — COMPREHENSIVE METABOLIC PANEL WITH GFR
ALT: 19 U/L (ref 0–53)
AST: 22 U/L (ref 0–37)
Albumin: 4.4 g/dL (ref 3.5–5.2)
Alkaline Phosphatase: 84 U/L (ref 39–117)
BUN: 20 mg/dL (ref 6–23)
CO2: 23 meq/L (ref 19–32)
Calcium: 9.7 mg/dL (ref 8.4–10.5)
Chloride: 108 meq/L (ref 96–112)
Creatinine, Ser: 0.97 mg/dL (ref 0.40–1.50)
GFR: 73.4 mL/min (ref >60.00)
Glucose, Bld: 101 mg/dL — ABNORMAL HIGH (ref 70–99)
Potassium: 4.2 meq/L (ref 3.5–5.1)
Sodium: 142 meq/L (ref 135–145)
Total Bilirubin: 0.4 mg/dL (ref 0.2–1.2)
Total Protein: 6.3 g/dL (ref 6.0–8.3)

## 2024-10-03 DIAGNOSIS — Z23 Encounter for immunization: Secondary | ICD-10-CM | POA: Diagnosis not present

## 2024-10-17 ENCOUNTER — Other Ambulatory Visit: Payer: Self-pay | Admitting: Psychiatry

## 2024-10-17 DIAGNOSIS — G3184 Mild cognitive impairment, so stated: Secondary | ICD-10-CM

## 2024-10-28 DIAGNOSIS — H524 Presbyopia: Secondary | ICD-10-CM | POA: Diagnosis not present

## 2024-10-28 DIAGNOSIS — H2513 Age-related nuclear cataract, bilateral: Secondary | ICD-10-CM | POA: Diagnosis not present

## 2024-11-24 ENCOUNTER — Other Ambulatory Visit: Payer: Self-pay | Admitting: Family Medicine

## 2024-12-10 ENCOUNTER — Other Ambulatory Visit: Payer: Self-pay | Admitting: Psychiatry

## 2024-12-10 DIAGNOSIS — G3184 Mild cognitive impairment, so stated: Secondary | ICD-10-CM

## 2024-12-11 ENCOUNTER — Ambulatory Visit (INDEPENDENT_AMBULATORY_CARE_PROVIDER_SITE_OTHER): Admitting: Psychiatry

## 2025-01-15 ENCOUNTER — Ambulatory Visit: Admitting: Psychiatry

## 2025-01-15 ENCOUNTER — Encounter: Payer: Self-pay | Admitting: Psychiatry

## 2025-01-15 DIAGNOSIS — F411 Generalized anxiety disorder: Secondary | ICD-10-CM | POA: Diagnosis not present

## 2025-01-15 DIAGNOSIS — R7989 Other specified abnormal findings of blood chemistry: Secondary | ICD-10-CM | POA: Diagnosis not present

## 2025-01-15 DIAGNOSIS — F3342 Major depressive disorder, recurrent, in full remission: Secondary | ICD-10-CM

## 2025-01-15 DIAGNOSIS — F02A0 Dementia in other diseases classified elsewhere, mild, without behavioral disturbance, psychotic disturbance, mood disturbance, and anxiety: Secondary | ICD-10-CM

## 2025-01-15 DIAGNOSIS — F422 Mixed obsessional thoughts and acts: Secondary | ICD-10-CM | POA: Diagnosis not present

## 2025-01-15 DIAGNOSIS — G301 Alzheimer's disease with late onset: Secondary | ICD-10-CM | POA: Diagnosis not present

## 2025-01-15 DIAGNOSIS — G3184 Mild cognitive impairment, so stated: Secondary | ICD-10-CM

## 2025-01-15 MED ORDER — MEMANTINE HCL 10 MG PO TABS: 10.0000 mg | ORAL_TABLET | Freq: Two times a day (BID) | ORAL | 1 refills | Status: AC

## 2025-01-15 MED ORDER — DONEPEZIL HCL 10 MG PO TABS: 10.0000 mg | ORAL_TABLET | Freq: Every day | ORAL | 3 refills | Status: AC

## 2025-01-15 MED ORDER — LITHIUM CARBONATE 150 MG PO CAPS: 450.0000 mg | ORAL_CAPSULE | Freq: Every day | ORAL | 1 refills | Status: AC

## 2025-01-15 MED ORDER — SERTRALINE HCL 100 MG PO TABS: 200.0000 mg | ORAL_TABLET | Freq: Every day | ORAL | 1 refills | Status: AC

## 2025-01-15 NOTE — Progress Notes (Signed)
 Jason [name] [id number] 01-15-1941 84 y.o.  Subjective:   Patient ID:  Jason [name] is a 84 y.o. (DOB [birth year]) male.  Chief Complaint:  Chief Complaint  Patient presents with   Follow-up   Depression    Jason [name] presents to the office today for follow-up of MDE, OCD and GAD.  seen August, 2020.  No meds were changed.  5/5//21  Seen with wife [name]. Had mild Covid in fall 2020 and got the vaccine.   CO forgetfulness with names and places but not getting words out correctly.   Wife notices the same but she's not overly concerned but she has noticed occ gets confused about people. PCP checked b12 normal bc pt brought up memory concern.   11/16/20 appt with following noted:  Seen with wife [name] Still doing well. Occ spellls of anxiety randomly.  Does better if doesn't forget meds. No SE.  Memory trouble limited mainly to names.  Not much worse.  OK otherwise. Depression can occur but only last a day.  Depression and anxiety under control.  Except notices Feb each year a change in disposition but it's minor.  Patient reports stable mood and denies depressed or irritable moods.   Patient denies difficulty with sleep initiation or maintenance but it is split with some napping.  EMA. Denies appetite disturbance.  Patient reports that energy and motivation have been good.  Patient denies any difficulty with concentration.  Patient denies any suicidal ideation. Stress caring for inlaws in their 90s.  F good cognition.  M cognitive problems. Plan: Patient's lithium  level was 0.51-year ago on 600 mg daily.  Level on 11/28/2020 was 0.9 on 600 mg daily.  The lithium  level has crept up a little bit.  I am concerned it may start causing side effects.  Therefore a prescription has been sent so that he can reduce the dose to 450 mg daily by using 3 of the 150 mg capsules.  06/10/2021 appointment with the following noted: seen with wife Doing fine and no problems with reduction in lithium  to 450 mg  daily. Frustration and down with world and politics and can interfere with sleep lately better. Staying up later and more irregular pattern.  Will nap at times. Questions about meds Plan no changes  12/09/21 appt noted: seen with wife Sertraline  200 mg daily and lithium  450 mg and donepezil  10 mg HS, Cerefolin NAC Consistent with meds.  No SE Doing well for 84 yo. Patient reports stable mood and denies depressed or irritable moods.  Patient denies any recent difficulty with anxiety.  Patient denies difficulty with sleep initiation or maintenance. Denies appetite disturbance.  Patient reports that energy and motivation have been good.  Patient denies any difficulty with concentration.  Patient denies any suicidal ideation. Helping homeless families. Plan: High relapse risk if reduce meds.  But rarely high dose sertraline  can interfere with cognition but he's at risk with reduction. No med changes. Continue lithium  450 mg daily Sertraline  200 Aricept  10  06/06/22 appt noted: seen with wife per usual Doing fine except fell off porch and hip fx with replacement since here. Recovery has gone well.  After surgery in March.  Finished PT at home and now some outpt PT. Dr. Redell Door.  Pleased with him. Not depressed.  Patient reports stable mood and denies depressed or irritable moods.  Patient denies any recent difficulty with anxiety.  Patient denies difficulty with sleep initiation or maintenance. Denies appetite disturbance.  Patient reports  that energy and motivation have been good.  Patient has mild forgetfulness. Patient denies any suicidal ideation. No SE problems.  12/06/22 appt noted:  seen with wife Good I most respects. Still some memory issues and tries to work on it and pay attention better. Not losing stuff.   Saw Dr. Gregg LOACH dx MCI  MMSE 23/30.  Plans neuropsych testing in Feb [facility] [place] [facility].  Also sched MRI 09/21/22 mild mod atrophy Satisfied with  meds.   Not dep. Momentary anxiety but not persistent.  No panic. Sleep is best it's been at the moment.  More effort to improve it.  Was going to sleep too late.   No problems with meds. Plan: No med changes. Continue lithium  450 mg daily Sertraline  200.  Option reduce Aricept  10 Continue Vitamin D  with level in 50s Check lithium  level  6/12/224 appt noted:  seen with wife.   I think I'm doing good.  Except forgetful esp names.  Fights off the forgetfulness.   He thinks it is better than it was.   Not depressed.  Anxiety ok.   Continues meds including Cerefolin NAC. No SE with meds.  Except poss tremor.   No concerns with meds.   DX mild OSA previously without benefit from CPAP .  Lost wt since then. Wife notices if several things going on then pt has trouble keeping up with them.  Plan: start Namenda  trial Continue lithium  450 mg daily Sertraline  200.  Option reduce Aricept  10 Continue Vitamin D  with level in 50s  12/07/23 appt noted:  seen with wife. Meds as above including memantine  10 BID, donepezil  10, sertraline  200, lithium  450 HS. Doing pretty well.  No sig dep nor anxiety.  Can get anxious under stress.  Enjoy having something to do.   Still memory problems.  Names and restaurants and new things.  Can forget names of meds.   Dr. Micheal checked TSH normal. Sleep is ok.   Dr. Pastor Gregg [facility] 09/06/23 MMSE =19,09/06/22 MMSE 23 Plan: High relapse risk if reduce meds.  But rarely high dose sertraline  can interfere with cognition but he's at risk with reduction. Continue Namenda  10 BID Continue lithium  450 mg daily Sertraline  200.  Option reduce Aricept  10 Continue Vitamin D  with level in 50s  06/11/24 appt noted : with W Meds as above including memantine  10 BID, donepezil  10, sertraline  200, lithium  450 HS, vit D. Not dep.  No sig anxiety other than brief.   No SE.   Saw PCP lately with no med changes  01/15/25 appt noted: with W Meds as above including memantine   10 BID, donepezil  10, sertraline  200, lithium  450 HS, vit D. Hip fx 2 + years ago and now problems with other.  In [place].  Had one THR and another appt pending. Dealing with some chronic hip pain.   No sig dep. A bit of anxiety over the hip px but OCD managed.   Past Psychiatric Medication Trials: Wellbutrin side effects, Lexapro nausea,  paroxetine, fluoxetine briefly, duloxetine, Effexor  with a history of benefit, mirtazapine, Pristiq 100, sertraline  200,  Abilify,  buspirone side effects, lithium  600, olanzapine 2.5 with benefit. Aricept  , memantine  10 BID ECT  Review of Systems:  Review of Systems  HENT:  Positive for hearing loss.   Respiratory:  Negative for chest tightness.   Cardiovascular:  Negative for chest pain and palpitations.  Genitourinary:  Positive for frequency.  Musculoskeletal:  Positive for arthralgias, back pain and gait  problem.  Neurological:  Negative for tremors.  Psychiatric/Behavioral:  Negative for agitation, behavioral problems, confusion, decreased concentration, dysphoric mood, hallucinations, self-injury, sleep disturbance and suicidal ideas. The patient is nervous/anxious. The patient is not hyperactive.     Medications: I have reviewed the patient's current medications.  Current Outpatient Medications  Medication Sig Dispense Refill   amLODipine  (NORVASC ) 5 MG tablet Take 1 tablet (5 mg total) by mouth daily. 90 tablet 3   BIOTIN PO Take 1 tablet by mouth daily.     [employer] DICLOFENAC SODIUM 1 % GEL Apply 2 g topically 4 (four) times daily as needed (Pain).     ferrous sulfate 325 (65 FE) MG tablet Take 325 mg by mouth daily with breakfast.     Methylfol-Algae-B12-Acetylcyst (CEREFOLIN NAC) 6-90.314-2-600 MG TABS Take 1 tablet by mouth daily. 90 tablet 3   Multiple Vitamins-Minerals (MULTIVITAMIN ADULTS) TABS Take 1 tablet by mouth daily.     simvastatin  (ZOCOR ) 20 MG tablet TAKE 1 TABLET BY MOUTH EVERY DAY 90 tablet 3   Vitamin D , Ergocalciferol ,  (DRISDOL ) 1.25 MG (50000 UNIT) CAPS capsule TAKE 1 CAPSULE BY MOUTH ONE TIME PER WEEK 12 capsule 1   donepezil  (ARICEPT ) 10 MG tablet Take 1 tablet (10 mg total) by mouth at bedtime. 90 tablet 3   lithium  carbonate 150 MG capsule Take 3 capsules (450 mg total) by mouth daily. 270 capsule 1   memantine  (NAMENDA ) 10 MG tablet Take 1 tablet (10 mg total) by mouth 2 (two) times daily. 180 tablet 1   sertraline  (ZOLOFT ) 100 MG tablet Take 2 tablets (200 mg total) by mouth daily. 180 tablet 1   No current facility-administered medications for this visit.    Medication Side Effects: None except occ jerks from lithium   Allergies: No Known Allergies  Past Medical History:  Diagnosis Date   Anxiety    Atrial fibrillation (HCC)    past hx   Colon polyp    Depression    GERD (gastroesophageal reflux disease)    Gout, unspecified    Hypertension    Osteoarthrosis, unspecified whether generalized or localized, unspecified site    Pneumonia, organism unspecified(486)    Pure hypercholesterolemia    Routine general medical examination at a health care facility    Sleep apnea    not using cpap currently   Unspecified nonpsychotic mental disorder     Family History  Problem Relation Age of Onset   Dementia Mother    Pneumonia Father    Heart failure Father    Arthritis Other    Colon cancer Neg Hx    Colon polyps Neg Hx    Esophageal cancer Neg Hx    Stomach cancer Neg Hx    Rectal cancer Neg Hx     Social History   Socioeconomic History   Marital status: Married    Spouse name: [name]   Number of children: 0   Years of education: Not on file   Highest education level: Not on file  Occupational History   Occupation: retired    Associate Professor: [employer]  Tobacco Use   Smoking status: Never   Smokeless tobacco: Never  Vaping Use   Vaping status: Never Used  Substance and Sexual Activity   Alcohol  use: No   Drug use: No   Sexual activity: Not on file  Other Topics Concern   Not  on file  Social History Narrative   Right handed   Lives with wife   Caffeine-1-2 daily  Social Drivers of Health   Tobacco Use: Low Risk (01/15/2025)   Patient History    Smoking Tobacco Use: Never    Smokeless Tobacco Use: Never    Passive Exposure: Not on file  Financial Resource Strain: Low Risk (09/23/2024)   Overall Financial Resource Strain (CARDIA)    Difficulty of Paying Living Expenses: Not hard at all  Food Insecurity: No Food Insecurity (09/23/2024)   Epic    Worried About Programme Researcher, Broadcasting/film/video in the Last Year: Never true    Ran Out of Food in the Last Year: Never true  Transportation Needs: No Transportation Needs (09/23/2024)   Epic    Lack of Transportation (Medical): No    Lack of Transportation (Non-Medical): No  Physical Activity: Inactive (09/23/2024)   Exercise Vital Sign    Days of Exercise per Week: 0 days    Minutes of Exercise per Session: 0 min  Stress: No Stress Concern Present (09/23/2024)   [employer] of Occupational Health - Occupational Stress Questionnaire    Feeling of Stress: Not at all  Social Connections: Socially Integrated (09/23/2024)   Social Connection and Isolation Panel    Frequency of Communication with Friends and Family: More than three times a week    Frequency of Social Gatherings with Friends and Family: More than three times a week    Attends Religious Services: More than 4 times per year    Active Member of Clubs or Organizations: Yes    Attends Banker Meetings: More than 4 times per year    Marital Status: Married  Catering Manager Violence: Not At Risk (09/23/2024)   Epic    Fear of Current or Ex-Partner: No    Emotionally Abused: No    Physically Abused: No    Sexually Abused: No  Depression (PHQ2-9): Low Risk (09/30/2024)   Depression (PHQ2-9)    PHQ-2 Score: 4  Alcohol  Screen: Low Risk (09/23/2024)   Alcohol  Screen    Last Alcohol  Screening Score (AUDIT): 0  Housing: Unknown (09/23/2024)   Epic     Unable to Pay for Housing in the Last Year: No    Number of Times Moved in the Last Year: Not on file    Homeless in the Last Year: No  Utilities: Not At Risk (09/23/2024)   Epic    Threatened with loss of utilities: No  Health Literacy: Adequate Health Literacy (09/23/2024)   B1300 Health Literacy    Frequency of need for help with medical instructions: Never    Past Medical History, Surgical history, Social history, and Family history were reviewed and updated as appropriate.   Please see review of systems for further details on the patient's review from today.   Objective:   Physical Exam:  There were no vitals taken for this visit.  Physical Exam Constitutional:      General: He is not in acute distress.    Appearance: He is well-developed.  Musculoskeletal:        General: No deformity.  Neurological:     Mental Status: He is alert and oriented to person, place, and time.     Motor: No tremor.     Coordination: Coordination normal.  Psychiatric:        Attention and Perception: Attention normal. He is attentive. He does not perceive auditory hallucinations.        Mood and Affect: Mood normal. Mood is not anxious or depressed. Affect is not labile, blunt, angry, tearful or inappropriate.  Speech: Speech normal. Speech is not slurred.        Behavior: Behavior normal. Behavior is not slowed.        Thought Content: Thought content normal. Thought content is not delusional. Thought content does not include homicidal or suicidal ideation. Thought content does not include suicidal plan.        Cognition and Memory: Cognition normal. He exhibits impaired recent memory.        Judgment: Judgment normal.     Comments: Insight is fair to good. Positive affect.  Not dep Memory no worse     Lab Review:     Component Value Date/Time   NA 142 09/30/2024 1459   K 4.2 09/30/2024 1459   CL 108 09/30/2024 1459   CO2 23 09/30/2024 1459   GLUCOSE 101 (H) 09/30/2024 1459    BUN 20 09/30/2024 1459   CREATININE 0.97 09/30/2024 1459   CALCIUM 9.7 09/30/2024 1459   PROT 6.3 09/30/2024 1459   ALBUMIN 4.4 09/30/2024 1459   AST 22 09/30/2024 1459   ALT 19 09/30/2024 1459   ALKPHOS 84 09/30/2024 1459   BILITOT 0.4 09/30/2024 1459   GFRNONAA >60 01/16/2024 0609   GFRAA >60 04/19/2020 1247       Component Value Date/Time   WBC 6.5 09/30/2024 1459   RBC 3.84 (L) 09/30/2024 1459   HGB 12.5 (L) 09/30/2024 1459   HCT 37.2 (L) 09/30/2024 1459   PLT 202.0 09/30/2024 1459   MCV 97.0 09/30/2024 1459   MCH 32.4 01/16/2024 0609   MCHC 33.5 09/30/2024 1459   RDW 13.1 09/30/2024 1459   LYMPHSABS 1.7 09/30/2024 1459   MONOABS 0.6 09/30/2024 1459   EOSABS 0.3 09/30/2024 1459   BASOSABS 0.0 09/30/2024 1459   Normal TSH in September.  Last lithium  0.8 in July 2019  Lithium  level January 10, 2027 0.5  Lithium  Lvl  Date Value Ref Range Status  06/13/2024 0.5 (L) 0.6 - 1.2 mmol/L Final  06/13/24 lithium  0.5 on 450  01/04/22 lithium  level 0.6 on 450 mg daily.  No results found for: PHENYTOIN, PHENOBARB, VALPROATE, CBMZ   .res Assessment: Plan:    Recurrent major depression in complete remission - Plan: Lithium  level, sertraline  (ZOLOFT ) 100 MG tablet, lithium  carbonate 150 MG capsule  Mixed obsessional thoughts and acts - Plan: sertraline  (ZOLOFT ) 100 MG tablet  Generalized anxiety disorder - Plan: sertraline  (ZOLOFT ) 100 MG tablet  Mild late onset Alzheimer's dementia without behavioral disturbance, psychotic disturbance, mood disturbance, or anxiety (HCC)  Low vitamin D  level  Mild cognitive impairment - Plan: memantine  (NAMENDA ) 10 MG tablet, donepezil  (ARICEPT ) 10 MG tablet   Mr. Broady has a long history of the above diagnoses which have been treatment resistant at times and multiple relapses.  He ultimately responded to the combination of sertraline  and lithium  and Zyprexa.  He has been able to wean off the Zyprexa without complication.  He's  continuing to do well.  As noted multiple failed meds and a history of treatment resistance. Each diagnosis under control and meds helping.   Extensive disucussion of sleep hygiene.  Also has nocturia.  Agree with pursuing hearing aids given cognitive concerns.  Then consider neuro work up.  Supportive therapy dealing with sick in-laws and caretaking.  Encourage self-care and recreation.  Also dealing with homeless people.  Counseled patient regarding potential benefits, risks, and side effects of lithium  to include potential risk of lithium  affecting thyroid  and renal function.  Discussed need for periodic lab monitoring  to determine drug level and to assess for potential adverse effects.  Counseled patient regarding signs and symptoms of lithium  toxicity and advised that they notify office immediately or seek urgent medical attention if experiencing these signs and symptoms.  Patient advised to contact office with any questions or concerns.  Call if any changes in BP meds.  Disc DDI with NSAID  Lithium  level is in the low normal range and is typical for the levels he has had over the last couple of years.  Labs good.  Disc risk s and TSH also normal. Has normal D on 50K weekly. and B12 levels Reduced lithium  from 600 to 450 Dec 2021 bc Cr creep without problems. 03/14/22 Cr. 0.8. Continue lithium  to AM to see if nocturia is helped.  01/04/22 lithium  level 0.6 on 450 mg daily. 01/31/23 Lithium  0.6  on 450 mg daily  [facility]heck [facility]  Check level every 6 mos.   Disc the difference between Cerefolin NAC and Metafolbic plus angain in detail.SABRA option switch to NAC & B complex  continue donepezil  10 mg nightly.   Neuro workup normal and neuropsych testing with the following results per Dr. Pastor Falling, [facility]. Results of neuropsychological testing were deemed valid and interpretable.  They reveal significant cognitive impairment across multiple domains, most notably affecting frontal  executive function and processing speed.  Memory was also substantially impacted, characterized by his shallow encoding, poor spontaneous retrieval, and limited benefit from cues.  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  These findings suggest probable frontal subcortical dysfunction, potentially compounded by early stage of Alzheimer disease.  Previously.  Disc these results with pt and his wife at their request.  Disc need to get hearing aids and how they might help cognition or slow cognitive decline.  Option Namenda   High relapse risk if reduce meds.  But rarely high dose sertraline  can interfere with cognition but he's at risk with reduction. No med change indicated:  Continue Namenda  10 BID Continue lithium  450 mg daily.  DT nocturia switch to AM to see if it is better Sertraline  200.  Option reduce Aricept  10 Continue Vitamin D  with level in 50s  Get lithium  level.  This appt was 30 mins.  FU 6 mos  Lorene Macintosh, MD, DFAPA     Please see After Visit Summary for patient specific instructions.  Future Appointments  Date Time Provider Department Center  07/15/2025  2:00 PM Cottle, Lorene KANDICE Raddle., MD [facility] None  10/01/2025  2:20 PM [facility]-ANNUAL WELLNESS VISIT [facility] [address]      Orders Placed This Encounter  Procedures   Lithium  level      -------------------------------

## 2025-07-15 ENCOUNTER — Ambulatory Visit (INDEPENDENT_AMBULATORY_CARE_PROVIDER_SITE_OTHER): Admitting: Psychiatry

## 2025-10-01 ENCOUNTER — Ambulatory Visit
